# Patient Record
Sex: Female | Born: 1955 | Race: White | Hispanic: No | Smoking: Never smoker
Health system: Southern US, Community
[De-identification: ages and names within clinical notes are randomized; demographics above are authoritative.]

## PROBLEM LIST (undated history)

## (undated) DIAGNOSIS — M797 Fibromyalgia: Secondary | ICD-10-CM

## (undated) DIAGNOSIS — R7303 Prediabetes: Secondary | ICD-10-CM

## (undated) DIAGNOSIS — E7800 Pure hypercholesterolemia, unspecified: Secondary | ICD-10-CM

## (undated) DIAGNOSIS — I10 Essential (primary) hypertension: Secondary | ICD-10-CM

## (undated) DIAGNOSIS — M069 Rheumatoid arthritis, unspecified: Secondary | ICD-10-CM

## (undated) DIAGNOSIS — M1990 Unspecified osteoarthritis, unspecified site: Secondary | ICD-10-CM

## (undated) DIAGNOSIS — I4891 Unspecified atrial fibrillation: Secondary | ICD-10-CM

## (undated) HISTORY — PX: TONSILLECTOMY: SUR1361

## (undated) HISTORY — PX: CATARACT EXTRACTION: SUR2

## (undated) HISTORY — PX: BACK SURGERY: SHX140

---

## 2020-06-20 ENCOUNTER — Emergency Department (HOSPITAL_BASED_OUTPATIENT_CLINIC_OR_DEPARTMENT_OTHER): Payer: Medicare HMO

## 2020-06-20 ENCOUNTER — Other Ambulatory Visit: Payer: Self-pay

## 2020-06-20 ENCOUNTER — Encounter (HOSPITAL_BASED_OUTPATIENT_CLINIC_OR_DEPARTMENT_OTHER): Payer: Self-pay

## 2020-06-20 ENCOUNTER — Emergency Department (HOSPITAL_BASED_OUTPATIENT_CLINIC_OR_DEPARTMENT_OTHER)
Admission: EM | Admit: 2020-06-20 | Discharge: 2020-06-21 | Disposition: A | Discharge status: Home / Self Care | Payer: Medicare HMO | Attending: Emergency Medicine | Admitting: Emergency Medicine

## 2020-06-20 DIAGNOSIS — R609 Edema, unspecified: Secondary | ICD-10-CM

## 2020-06-20 DIAGNOSIS — R600 Localized edema: Secondary | ICD-10-CM | POA: Insufficient documentation

## 2020-06-20 DIAGNOSIS — I4891 Unspecified atrial fibrillation: Secondary | ICD-10-CM | POA: Insufficient documentation

## 2020-06-20 DIAGNOSIS — I10 Essential (primary) hypertension: Secondary | ICD-10-CM | POA: Insufficient documentation

## 2020-06-20 DIAGNOSIS — R0602 Shortness of breath: Secondary | ICD-10-CM | POA: Diagnosis not present

## 2020-06-20 DIAGNOSIS — Z7901 Long term (current) use of anticoagulants: Secondary | ICD-10-CM | POA: Diagnosis not present

## 2020-06-20 HISTORY — DX: Unspecified atrial fibrillation: I48.91

## 2020-06-20 HISTORY — DX: Rheumatoid arthritis, unspecified: M06.9

## 2020-06-20 HISTORY — DX: Pure hypercholesterolemia, unspecified: E78.00

## 2020-06-20 HISTORY — DX: Prediabetes: R73.03

## 2020-06-20 HISTORY — DX: Unspecified osteoarthritis, unspecified site: M19.90

## 2020-06-20 HISTORY — DX: Fibromyalgia: M79.7

## 2020-06-20 HISTORY — DX: Essential (primary) hypertension: I10

## 2020-06-20 LAB — CBC WITH DIFFERENTIAL/PLATELET
Abs Immature Granulocytes: 0.01 10*3/uL (ref 0.00–0.07)
Basophils Absolute: 0.1 10*3/uL (ref 0.0–0.1)
Basophils Relative: 1 %
Eosinophils Absolute: 0.2 10*3/uL (ref 0.0–0.5)
Eosinophils Relative: 3 %
HCT: 32.7 % — ABNORMAL LOW (ref 36.0–46.0)
Hemoglobin: 10.7 g/dL — ABNORMAL LOW (ref 12.0–15.0)
Immature Granulocytes: 0 %
Lymphocytes Relative: 24 %
Lymphs Abs: 1.9 10*3/uL (ref 0.7–4.0)
MCH: 31.8 pg (ref 26.0–34.0)
MCHC: 32.7 g/dL (ref 30.0–36.0)
MCV: 97 fL (ref 80.0–100.0)
Monocytes Absolute: 1 10*3/uL (ref 0.1–1.0)
Monocytes Relative: 13 %
Neutro Abs: 4.6 10*3/uL (ref 1.7–7.7)
Neutrophils Relative %: 59 %
Platelets: 234 10*3/uL (ref 150–400)
RBC: 3.37 MIL/uL — ABNORMAL LOW (ref 3.87–5.11)
RDW: 14.7 % (ref 11.5–15.5)
WBC: 7.8 10*3/uL (ref 4.0–10.5)
nRBC: 0 % (ref 0.0–0.2)

## 2020-06-20 IMAGING — DX DG CHEST 2V
2 series · 2 of 2 positions shown · non-contrast
Comparison: None.

CLINICAL DATA: Bilateral lower extremity pain and swelling.

EXAM:
CHEST - 2 VIEW

[chest pa]
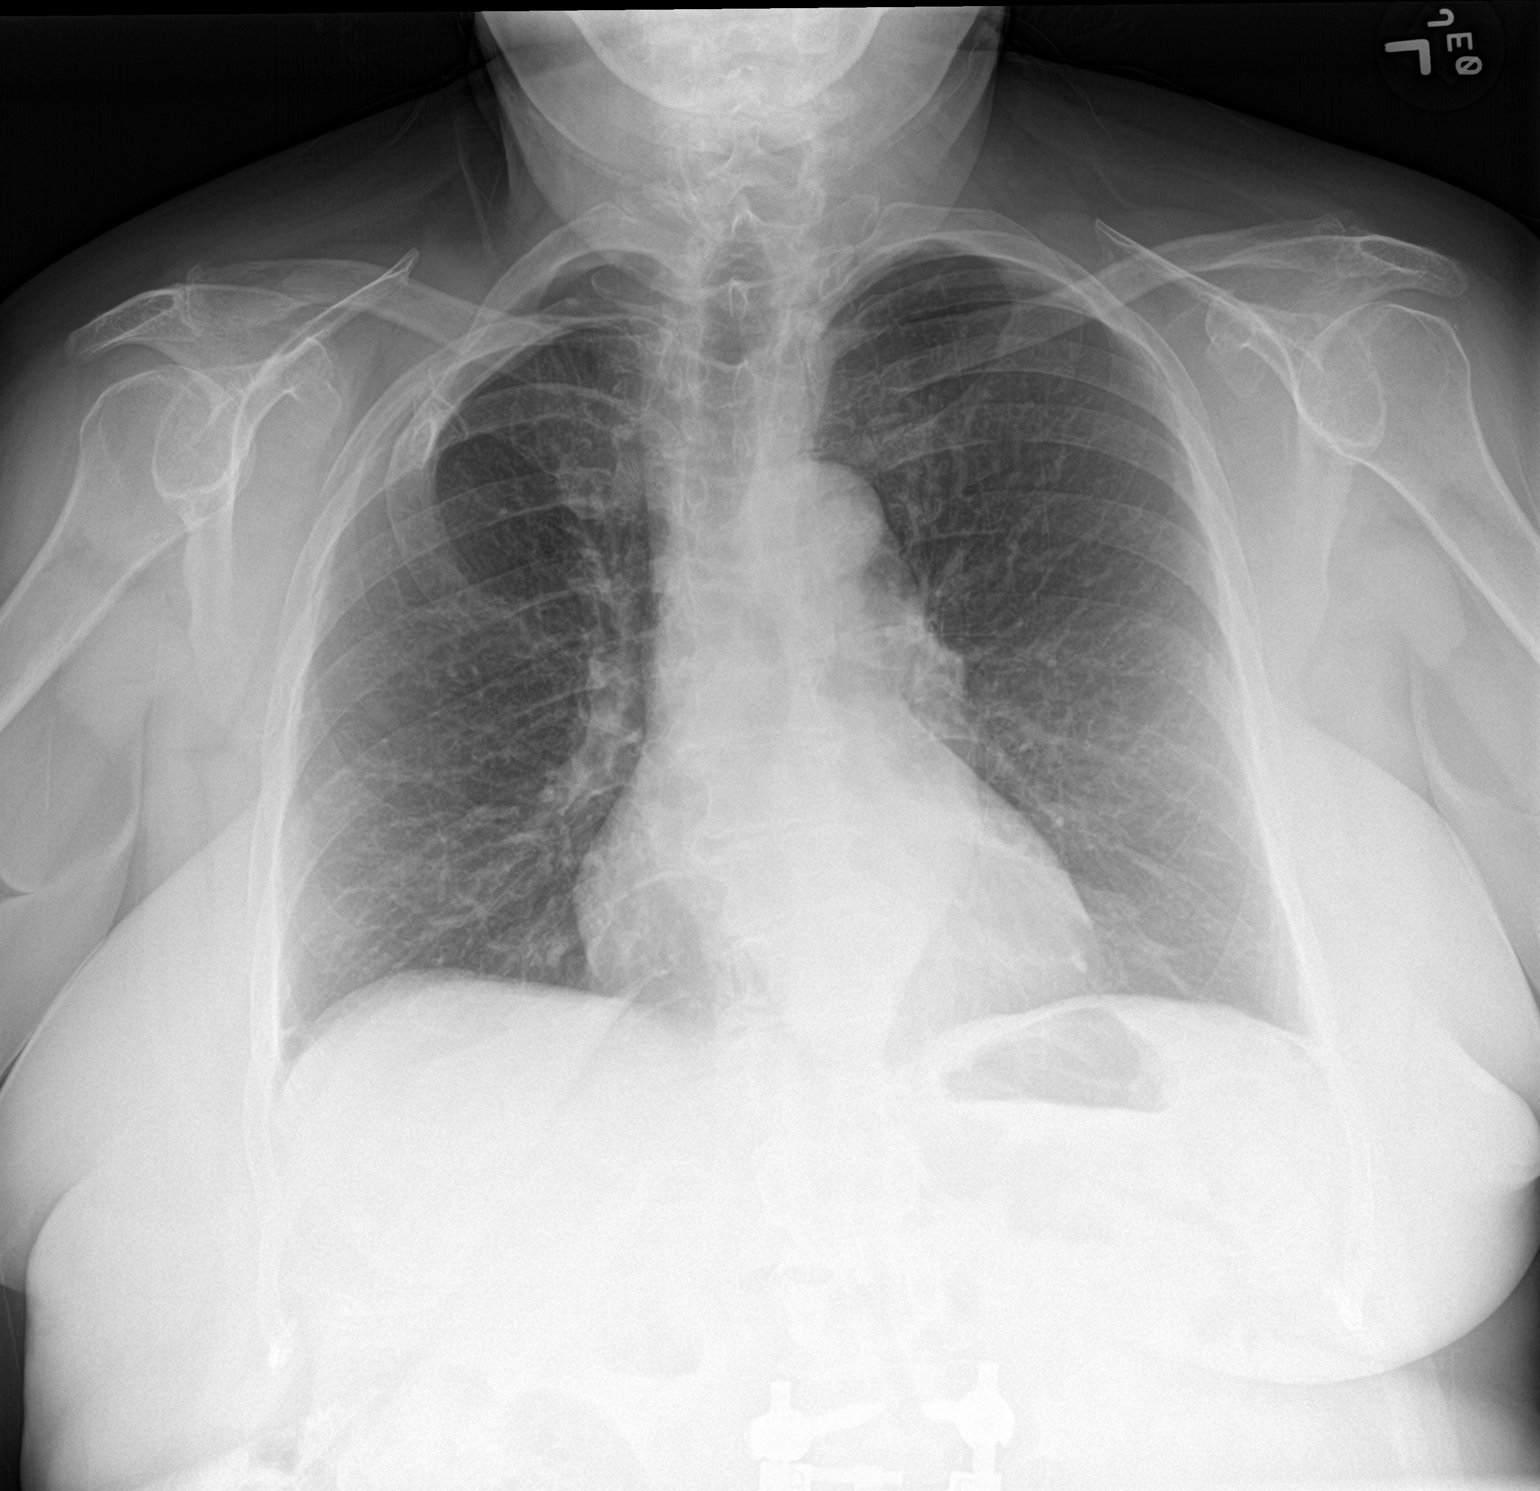

[chest lat]
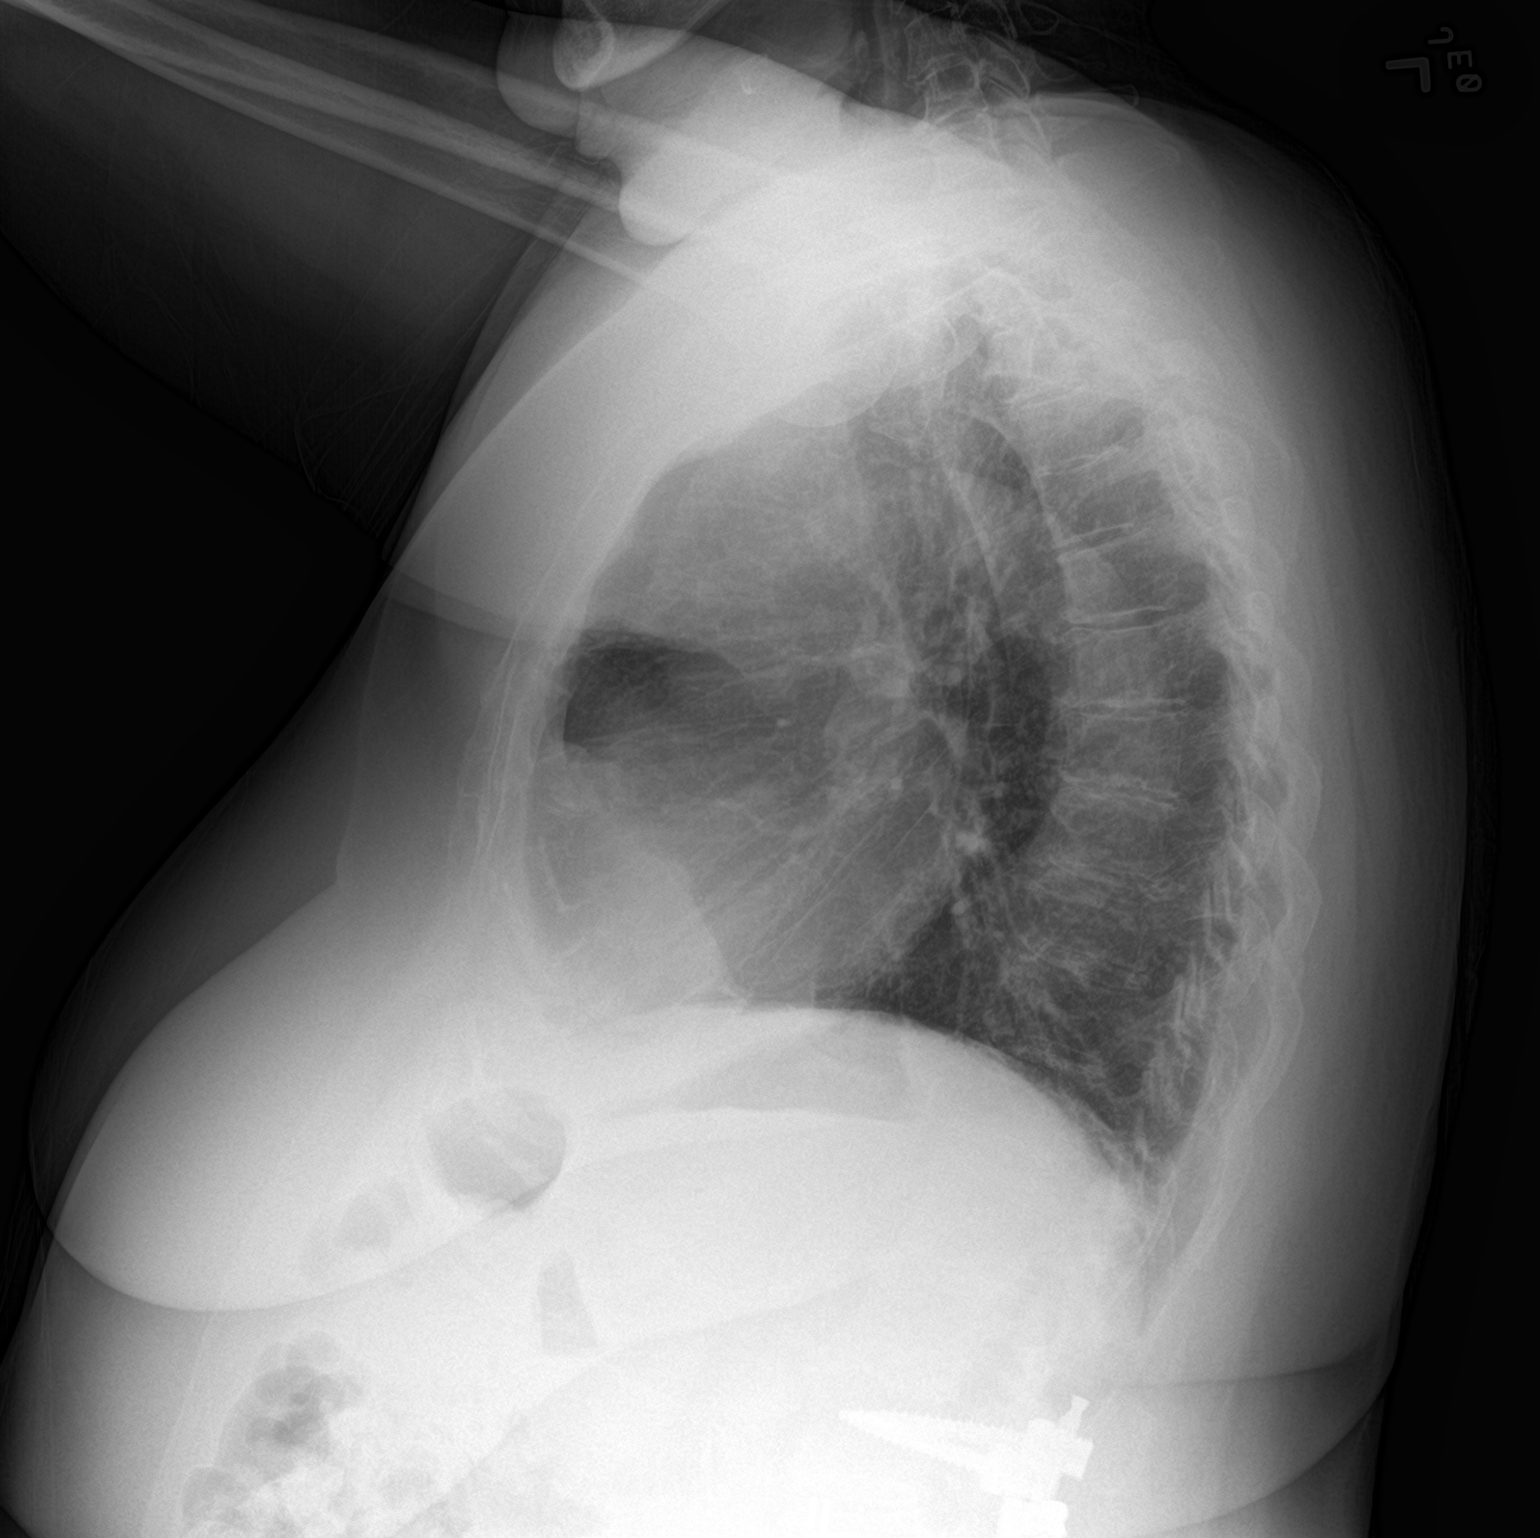

[2 of 2 positions shown; findings below may reference images not displayed]

FINDINGS: Chronic appearing increased lung markings are seen without evidence
of acute infiltrate, pleural effusion or pneumothorax. The heart
size and mediastinal contours are within normal limits. There is
tortuosity of the descending thoracic aorta. Chronic right rib
deformities are noted. Postoperative changes are seen within the
visualized portion of the lumbar spine.
IMPRESSION: No acute or active cardiopulmonary disease.

## 2020-06-20 NOTE — ED Triage Notes (Addendum)
Pt c/o bilat LE swelling/pain started 3/3 after a flight-states her O2 sats were mid 80s-mid 90s but was 97 at PCP office today-states she was seen by PCP today and advised to come to ED-NAD-steady gait

## 2020-06-20 NOTE — ED Provider Notes (Signed)
[facility] EMERGENCY DEPARTMENT Provider Note   CSN: [id number] Arrival date & time: 06/20/20  2025     History Chief Complaint  Patient presents with  . Leg Swelling    Holly Henry is a 65 y.o. female.  HPI     This is a 66 year old female with history of atrial fibrillation on Eliquis, hypertension, rheumatoid arthritis who presents with lower extremity swelling.  Patient reports that she traveled to [place] last week.  She got on an airplane to fly home and noted the next day to have bilateral foot swelling.  She did not note any significant leg swelling.  Since that time she is also had some shortness of breath.  She states her O2 sats have been anywhere from 89 to 97% at home.  She not had any cough or fevers.  Patient reports compliance with her Eliquis.  She was seen and evaluated by her primary physician and referred here for further evaluation.  Patient denies any chest pain.  She states that she feels her swelling is overall better.  It has always been symmetric.  Chart reviewed.  Patient referred to the ED by her primary physician for concern for possible PE/DVT given recent travel  Past Medical History:  Diagnosis Date  . A-fib (HCC)   . Fibromyalgia   . High cholesterol   . Hypertension   . Osteoarthritis   . Prediabetes   . Rheumatoid arthritis (HCC)     There are no problems to display for this patient.   Past Surgical History:  Procedure Laterality Date  . BACK SURGERY    . TONSILLECTOMY       OB History   No obstetric history on file.     No family history on file.  Social History   Tobacco Use  . Smoking status: Never Smoker  . Smokeless tobacco: Never Used  Substance Use Topics  . Alcohol use: Never  . Drug use: Never    Home Medications Prior to Admission medications   Medication Sig Start Date End Date Taking? Authorizing Provider  furosemide (LASIX) 20 MG tablet Take 1 tablet (20 mg total) by mouth daily. 06/21/20  Yes  Nehemiah Montee, Mayer Masker, MD    Allergies    Patient has no known allergies.  Review of Systems   Review of Systems  Constitutional: Negative for fever.  Respiratory: Positive for shortness of breath. Negative for cough.   Cardiovascular: Positive for leg swelling. Negative for chest pain.  Gastrointestinal: Negative for abdominal pain, nausea and vomiting.  All other systems reviewed and are negative.   Physical Exam Updated Vital Signs BP (!) 123/59 (BP Location: Right Arm)   Pulse 80   Temp 98.4 F (36.9 C) (Oral)   Resp (!) 29   Ht 1.549 m (5\' 1" )   Wt 90.9 kg   SpO2 95%   BMI 37.88 kg/m   Physical Exam Vitals and nursing note reviewed.  Constitutional:      Appearance: She is well-developed and well-nourished. She is obese. She is not ill-appearing.  HENT:     Head: Normocephalic and atraumatic.     Nose: Nose normal.     Mouth/Throat:     Mouth: Mucous membranes are moist.  Eyes:     Pupils: Pupils are equal, round, and reactive to light.  Cardiovascular:     Rate and Rhythm: Normal rate and regular rhythm.     Heart sounds: Normal heart sounds.  Pulmonary:     Effort:  Pulmonary effort is normal. No respiratory distress.     Breath sounds: No wheezing.  Abdominal:     General: Bowel sounds are normal.     Palpations: Abdomen is soft.     Tenderness: There is no abdominal tenderness.  Musculoskeletal:     Cervical back: Neck supple.     Right lower leg: Edema present.     Left lower leg: Edema present.     Comments: Symmetric trace bilateral lower extremity edema  Skin:    General: Skin is warm and dry.  Neurological:     Mental Status: She is alert and oriented to person, place, and time.  Psychiatric:        Mood and Affect: Mood and affect and mood normal.     ED Results / Procedures / Treatments   Labs (all labs ordered are listed, but only abnormal results are displayed) Labs Reviewed  CBC WITH DIFFERENTIAL/PLATELET - Abnormal; Notable for the  following components:      Result Value   RBC 3.37 (*)    Hemoglobin 10.7 (*)    HCT 32.7 (*)    All other components within normal limits  BASIC METABOLIC PANEL - Abnormal; Notable for the following components:   Potassium 3.4 (*)    Glucose, Bld 128 (*)    Creatinine, Ser 1.07 (*)    GFR, Estimated 58 (*)    All other components within normal limits  BRAIN NATRIURETIC PEPTIDE - Abnormal; Notable for the following components:   B Natriuretic Peptide 119.0 (*)    All other components within normal limits  D-DIMER, QUANTITATIVE (NOT AT [facility]) - Abnormal; Notable for the following components:   D-Dimer, Quant 0.57 (*)    All other components within normal limits    EKG None ED ECG REPORT   Date: 06/21/2020  Rate: 69  Rhythm: normal sinus rhythm  QRS Axis: normal  Intervals: normal  ST/T Wave abnormalities: normal  Conduction Disutrbances:none  Narrative Interpretation:   Old EKG Reviewed: none available  I have personally reviewed the EKG tracing and agree with the computerized printout as noted.  Radiology DG Chest 2 View  Result Date: 06/21/2020 CLINICAL DATA:  Bilateral lower extremity pain and swelling. EXAM: CHEST - 2 VIEW COMPARISON:  None. FINDINGS: Chronic appearing increased lung markings are seen without evidence of acute infiltrate, pleural effusion or pneumothorax. The heart size and mediastinal contours are within normal limits. There is tortuosity of the descending thoracic aorta. Chronic right rib deformities are noted. Postoperative changes are seen within the visualized portion of the lumbar spine. IMPRESSION: No acute or active cardiopulmonary disease. Electronically Signed   By: Aram Candela M.D.   On: 06/21/2020 00:13    Procedures Procedures   Medications Ordered in ED Medications  furosemide (LASIX) tablet 20 mg (20 mg Oral Given 06/21/20 0138)    ED Course  I have reviewed the triage vital signs and the nursing notes.  Pertinent labs &  imaging results that were available during my care of the patient were reviewed by me and considered in my medical decision making (see chart for details).    MDM Rules/Calculators/A&P                          Patient presents with lower extremity edema.  Overall nontoxic vital signs are reassuring.  She does report some intermittent shortness of breath.  She is not in any respiratory distress.  Swelling appears symmetric  and trace on my exam.  No calf swelling or asymmetry to suggest DVT.  She also reports compliance with Eliquis.  Have a low suspicion for DVT.  Will send screening D-dimer.  Will also screen for heart failure with chest x-ray and BNP.  Chest x-ray clear without evidence of pleural effusion or vascular congestion.  BNP is slightly elevated at 119.  D-dimer is 0.57 which based on age-adjusted cutoffs would be negative.  Patient was given 20 mg p.o. Lasix.  Will place on Lasix for the next 2 days to increase urine output.  Highly suspect peripheral edema and volume overload.  Recommend monitoring salt in the diet.  For completeness, I did order outpatient lower extremity Dopplers as well.  Recommend follow-up with primary physician.  After history, exam, and medical workup I feel the patient has been appropriately medically screened and is safe for discharge home. Pertinent diagnoses were discussed with the patient. Patient was given return precautions.  Final Clinical Impression(s) / ED Diagnoses Final diagnoses:  Peripheral edema    Rx / DC Orders ED Discharge Orders         Ordered    furosemide (LASIX) 20 MG tablet  Daily        06/21/20 0148    US Venous Img Lower Bilateral        06/21/20 0148           Shon Baton, MD 06/21/20 [phone]

## 2020-06-20 NOTE — ED Notes (Signed)
Pt placed in a gown and hooked up to the monitor with the BP cuff and pulse ox 

## 2020-06-21 ENCOUNTER — Ambulatory Visit (HOSPITAL_BASED_OUTPATIENT_CLINIC_OR_DEPARTMENT_OTHER)
Admission: RE | Admit: 2020-06-21 | Discharge: 2020-06-21 | Disposition: A | Discharge status: Home / Self Care | Payer: Medicare HMO | Source: Ambulatory Visit | Attending: Emergency Medicine | Admitting: Emergency Medicine

## 2020-06-21 DIAGNOSIS — R609 Edema, unspecified: Secondary | ICD-10-CM | POA: Insufficient documentation

## 2020-06-21 LAB — BASIC METABOLIC PANEL
Anion gap: 11 (ref 5–15)
BUN: 21 mg/dL (ref 8–23)
CO2: 27 mmol/L (ref 22–32)
Calcium: 9.6 mg/dL (ref 8.9–10.3)
Chloride: 102 mmol/L (ref 98–111)
Creatinine, Ser: 1.07 mg/dL — ABNORMAL HIGH (ref 0.44–1.00)
GFR, Estimated: 58 mL/min — ABNORMAL LOW (ref >60)
Glucose, Bld: 128 mg/dL — ABNORMAL HIGH (ref 70–99)
Potassium: 3.4 mmol/L — ABNORMAL LOW (ref 3.5–5.1)
Sodium: 140 mmol/L (ref 135–145)

## 2020-06-21 LAB — BRAIN NATRIURETIC PEPTIDE: B Natriuretic Peptide: 119 pg/mL — ABNORMAL HIGH (ref 0.0–100.0)

## 2020-06-21 LAB — D-DIMER, QUANTITATIVE: D-Dimer, Quant: 0.57 ug/mL-FEU — ABNORMAL HIGH (ref 0.00–0.50)

## 2020-06-21 IMAGING — US US EXTREM LOW VENOUS
1 series · 13 of 24 positions shown · non-contrast
Comparison: None.

CLINICAL DATA: Lower extremity swelling.



[Series 1: us extrem low venous · 13 of 71 slices shown]
[im 1/71]
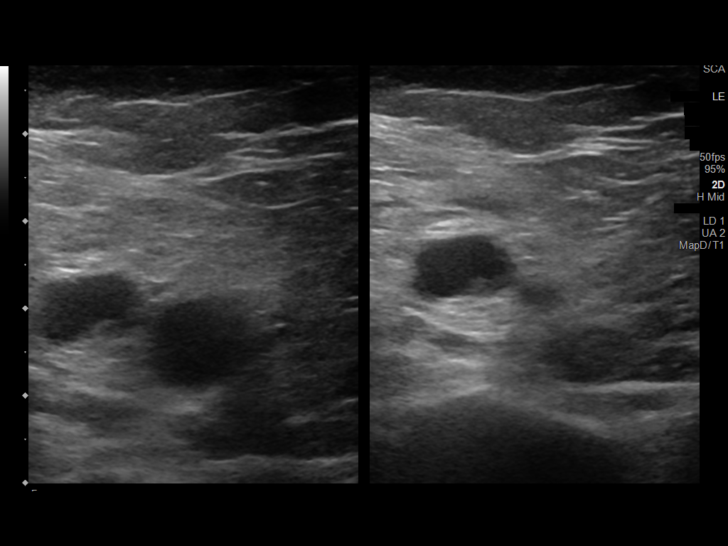
[im 7/71]
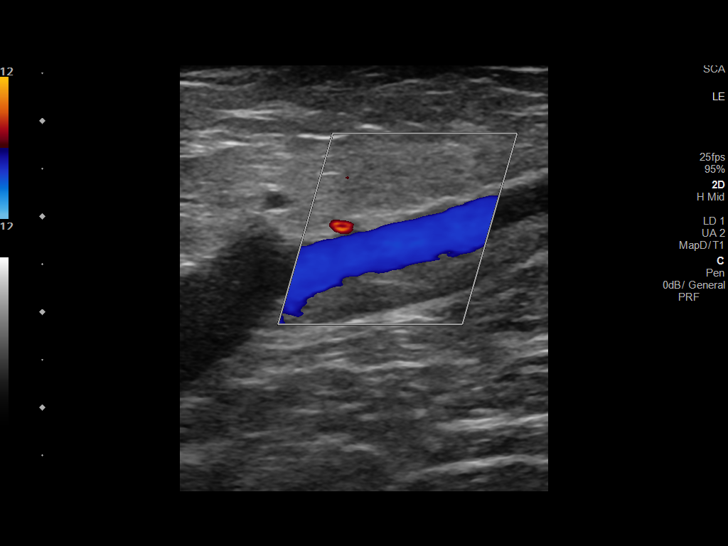
[im 13/71]
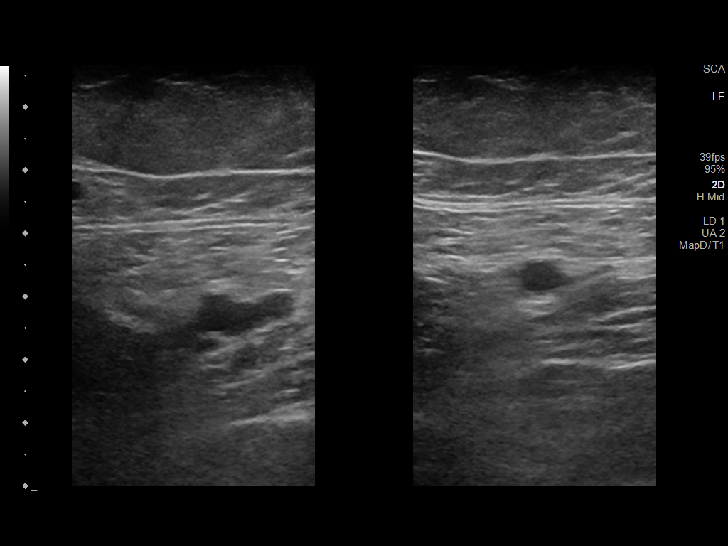
[im 19/71]
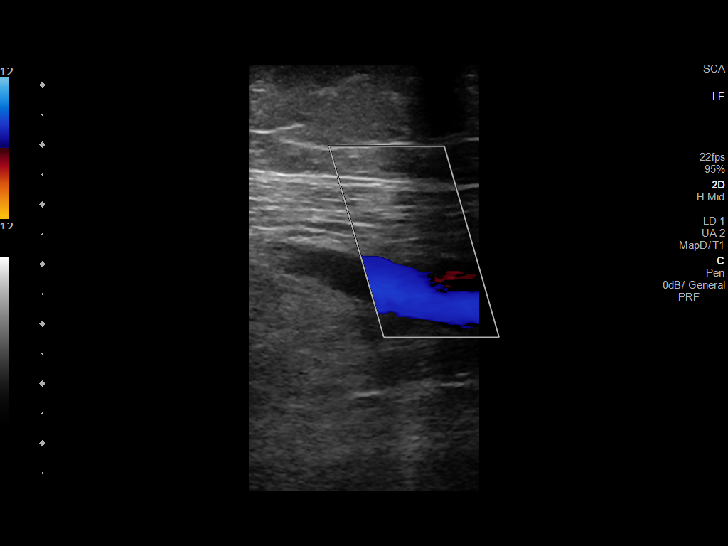
[im 25/71]
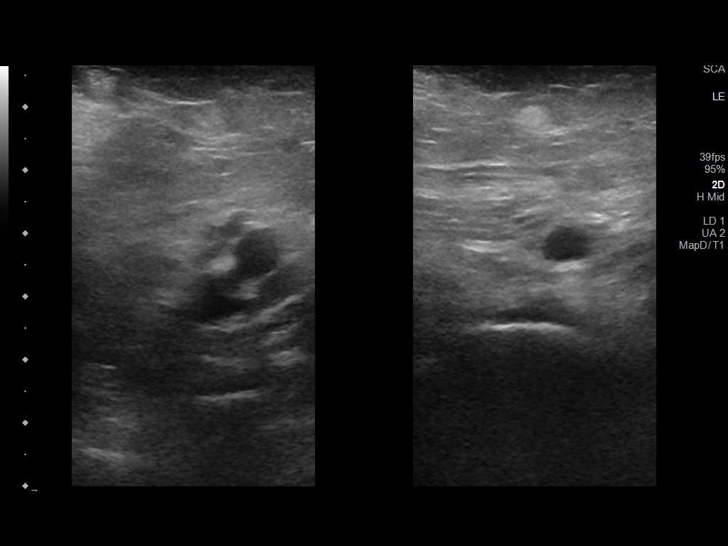
[im 31/71]
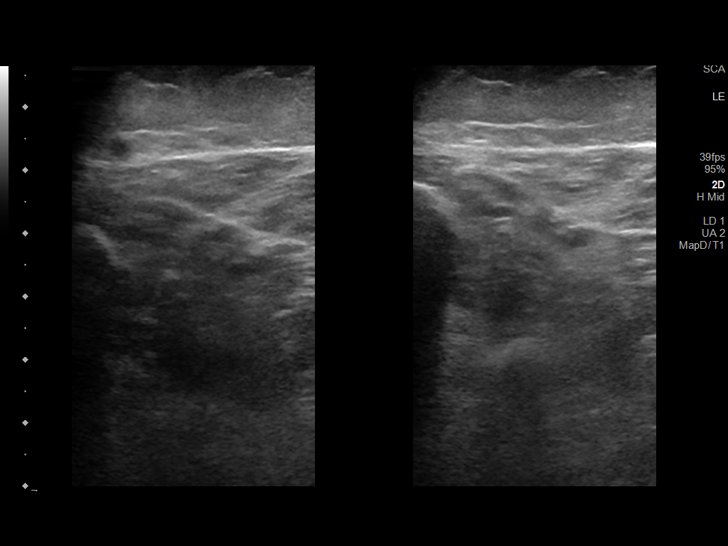
[im 37/71]
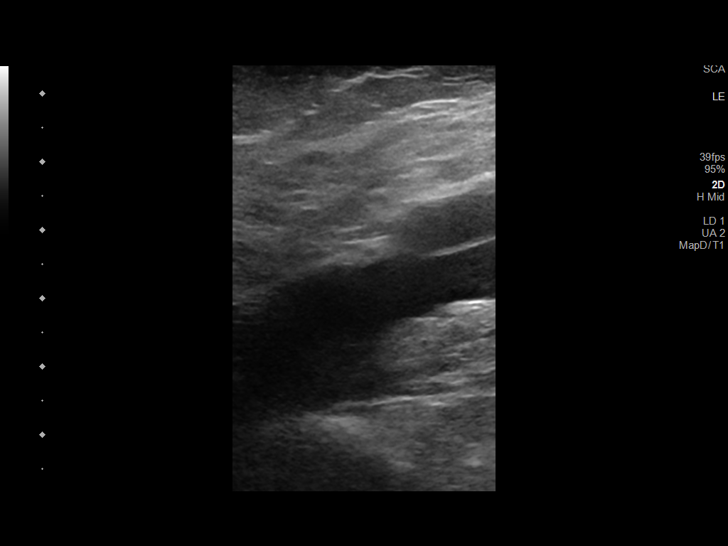
[im 40/71]
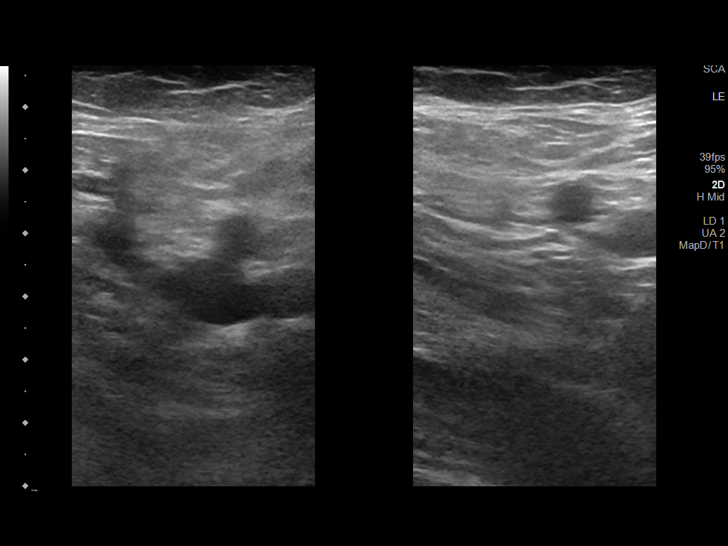
[im 46/71]
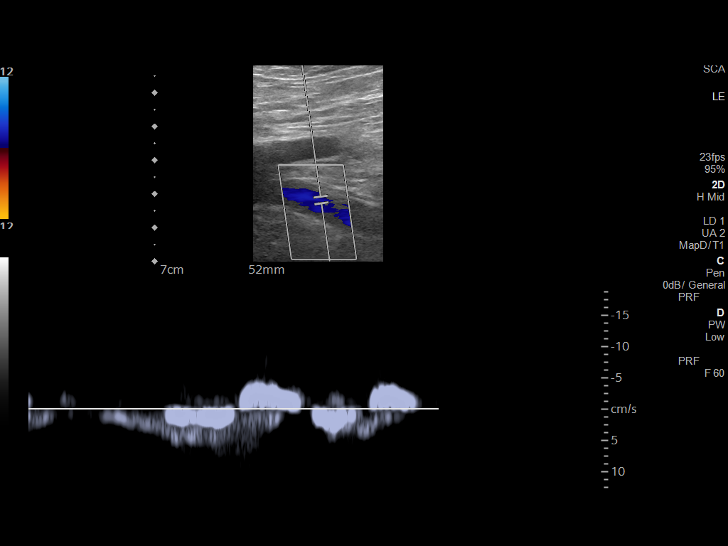
[im 52/71]
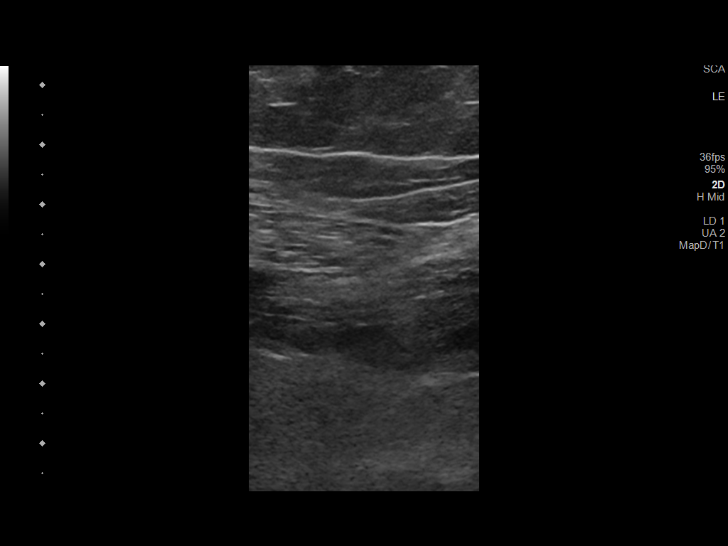
[im 58/71]
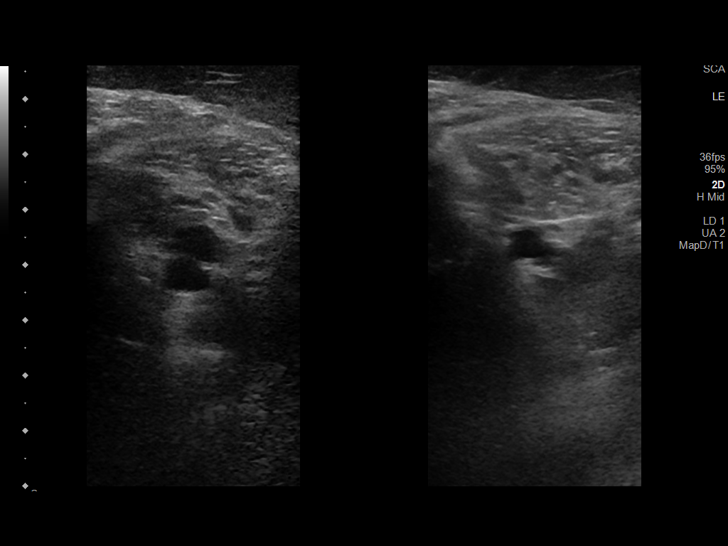
[im 64/71]
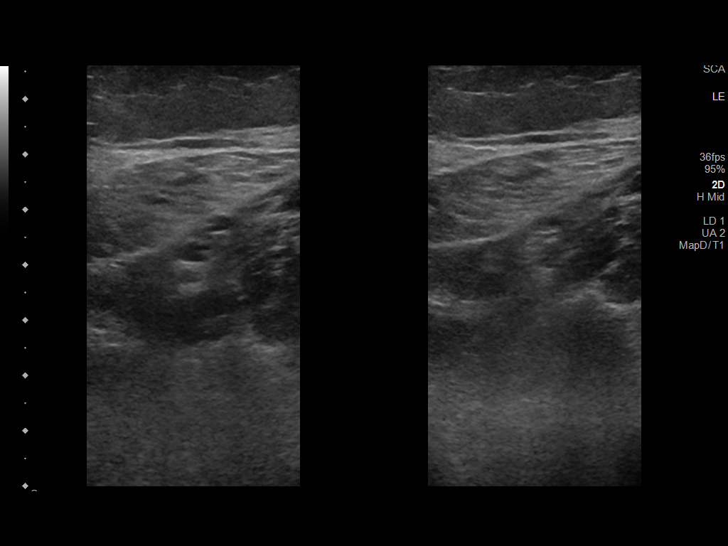
[im 71/71]
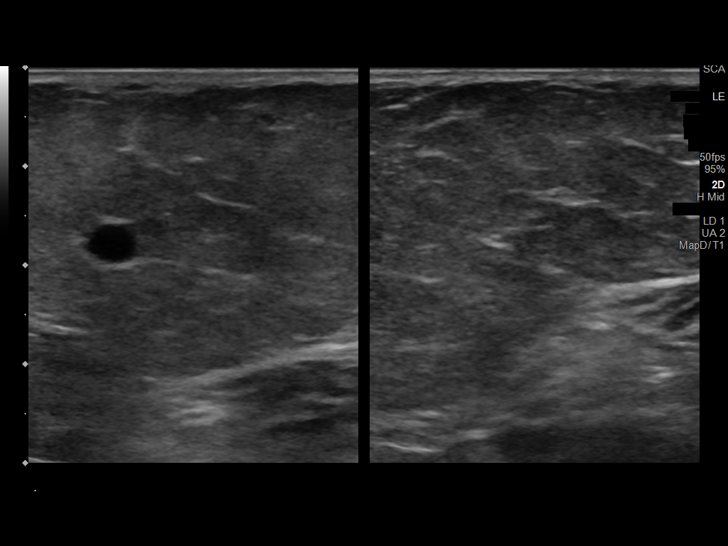

[13 of 24 positions shown; findings below may reference images not displayed]

FINDINGS: RIGHT LOWER EXTREMITY

Common Femoral Vein: No evidence of thrombus. Normal
compressibility, respiratory phasicity and response to augmentation.

Saphenofemoral Junction: No evidence of thrombus. Normal
compressibility and flow on color Doppler imaging.

Profunda Femoral Vein: No evidence of thrombus. Normal
compressibility and flow on color Doppler imaging.

Femoral Vein: No evidence of thrombus. Normal compressibility,
respiratory phasicity and response to augmentation.

Popliteal Vein: No evidence of thrombus. Normal compressibility,
respiratory phasicity and response to augmentation.

Calf Veins: No evidence of thrombus. Normal compressibility and flow
on color Doppler imaging.

Superficial Great Saphenous Vein: No evidence of thrombus. Normal
compressibility.

Other Findings:  None.

LEFT LOWER EXTREMITY

Common Femoral Vein: No evidence of thrombus. Normal
compressibility, respiratory phasicity and response to augmentation.

Saphenofemoral Junction: No evidence of thrombus. Normal
compressibility and flow on color Doppler imaging.

Profunda Femoral Vein: No evidence of thrombus. Normal
compressibility and flow on color Doppler imaging.

Femoral Vein: No evidence of thrombus. Normal compressibility,
respiratory phasicity and response to augmentation.

Popliteal Vein: No evidence of thrombus. Normal compressibility,
respiratory phasicity and response to augmentation.

Calf Veins: No evidence of thrombus. Normal compressibility and flow
on color Doppler imaging.

Superficial Great Saphenous Vein: No evidence of thrombus. Normal
compressibility.

Other Findings:  None.
IMPRESSION: No evidence of deep venous thrombosis in either lower extremity.

## 2020-06-21 MED ORDER — FUROSEMIDE 20 MG PO TABS
20.0000 mg | ORAL_TABLET | Freq: Once | ORAL | Status: AC
  Administered 2020-06-21: 20 mg via ORAL
  Filled 2020-06-21: qty 1

## 2020-06-21 MED ORDER — FUROSEMIDE 20 MG PO TABS: 20.0000 mg | ORAL_TABLET | Freq: Every day | ORAL | 0 refills | Status: AC

## 2020-06-21 NOTE — Discharge Instructions (Addendum)
You are seen today for lower extremity swelling.  At this time I have low suspicion for DVT but will order ultrasound for you to return for.  You will be placed on Lasix for 2 additional days for fluid retention.  Follow-up with your primary care physician.

## 2020-06-21 NOTE — ED Provider Notes (Signed)
Patient returned to the ER for venous doppler bilateral lower extremities, negative for DVT. Discussed results, plan is to follow up with PCP.    Jeannie Fend, PA-C 06/21/20 1205    Benjiman Core, MD 06/21/20 1451

## 2020-06-21 NOTE — ED Notes (Signed)
Attempted ultrasound IV X 2 without success. Patient tolerated well. 

## 2020-09-28 ENCOUNTER — Emergency Department (HOSPITAL_COMMUNITY): Payer: Medicare HMO

## 2020-09-28 ENCOUNTER — Encounter (HOSPITAL_COMMUNITY): Payer: Self-pay | Admitting: Emergency Medicine

## 2020-09-28 ENCOUNTER — Emergency Department (HOSPITAL_COMMUNITY)
Admission: EM | Admit: 2020-09-28 | Discharge: 2020-09-28 | Disposition: A | Discharge status: Home / Self Care | Payer: Medicare HMO | Attending: Emergency Medicine | Admitting: Emergency Medicine

## 2020-09-28 DIAGNOSIS — Z79899 Other long term (current) drug therapy: Secondary | ICD-10-CM | POA: Insufficient documentation

## 2020-09-28 DIAGNOSIS — R911 Solitary pulmonary nodule: Secondary | ICD-10-CM | POA: Diagnosis not present

## 2020-09-28 DIAGNOSIS — R079 Chest pain, unspecified: Secondary | ICD-10-CM | POA: Diagnosis not present

## 2020-09-28 DIAGNOSIS — I10 Essential (primary) hypertension: Secondary | ICD-10-CM | POA: Diagnosis not present

## 2020-09-28 DIAGNOSIS — Z7901 Long term (current) use of anticoagulants: Secondary | ICD-10-CM | POA: Insufficient documentation

## 2020-09-28 DIAGNOSIS — M25512 Pain in left shoulder: Secondary | ICD-10-CM | POA: Insufficient documentation

## 2020-09-28 DIAGNOSIS — I4891 Unspecified atrial fibrillation: Secondary | ICD-10-CM | POA: Diagnosis not present

## 2020-09-28 LAB — BASIC METABOLIC PANEL
Anion gap: 8 (ref 5–15)
BUN: 19 mg/dL (ref 8–23)
CO2: 26 mmol/L (ref 22–32)
Calcium: 8.7 mg/dL — ABNORMAL LOW (ref 8.9–10.3)
Chloride: 103 mmol/L (ref 98–111)
Creatinine, Ser: 1.02 mg/dL — ABNORMAL HIGH (ref 0.44–1.00)
GFR, Estimated: >60 mL/min (ref >60)
Glucose, Bld: 116 mg/dL — ABNORMAL HIGH (ref 70–99)
Potassium: 4.2 mmol/L (ref 3.5–5.1)
Sodium: 137 mmol/L (ref 135–145)

## 2020-09-28 LAB — CBC
HCT: 34.5 % — ABNORMAL LOW (ref 36.0–46.0)
Hemoglobin: 11.1 g/dL — ABNORMAL LOW (ref 12.0–15.0)
MCH: 30.7 pg (ref 26.0–34.0)
MCHC: 32.2 g/dL (ref 30.0–36.0)
MCV: 95.3 fL (ref 80.0–100.0)
Platelets: 165 10*3/uL (ref 150–400)
RBC: 3.62 MIL/uL — ABNORMAL LOW (ref 3.87–5.11)
RDW: 14.6 % (ref 11.5–15.5)
WBC: 6.1 10*3/uL (ref 4.0–10.5)
nRBC: 0 % (ref 0.0–0.2)

## 2020-09-28 LAB — TROPONIN I (HIGH SENSITIVITY)
Troponin I (High Sensitivity): 2 ng/L (ref <18)
Troponin I (High Sensitivity): 3 ng/L (ref <18)

## 2020-09-28 IMAGING — CT CT ANGIO CHEST
3 of 7 series · 18 of 46 positions shown · IV contrast (APPLIED)
Comparison: None.

CLINICAL DATA: Chest pain

EXAM:
CT ANGIOGRAPHY CHEST WITHOUT AND WITH CONTRAST
TECHNIQUE: Multidetector CT imaging of the chest was performed using the
standard protocol before and during bolus administration of
intravenous contrast. Multiplanar CT image reconstructions and MIPs
were obtained to evaluate the vascular anatomy.
CONTRAST:  75mL OMNIPAQUE IOHEXOL 350 MG/ML SOLN

[Series 6: arterial · axial · arterial · 0.76mm/px · z∈[+1036,+1276]mm · 11 of 146 slices shown]
[im 13/146  lung]
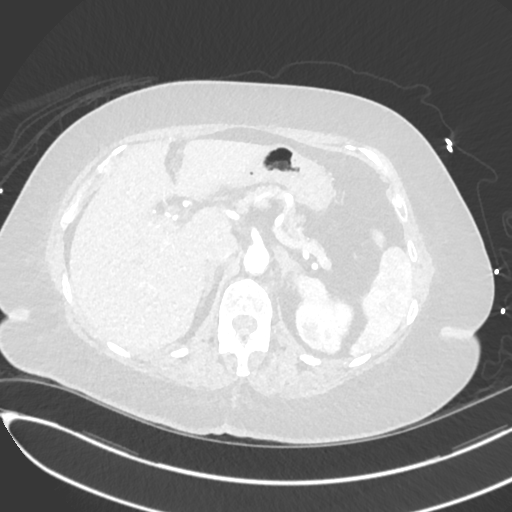
[im 25/146  soft-tissue]
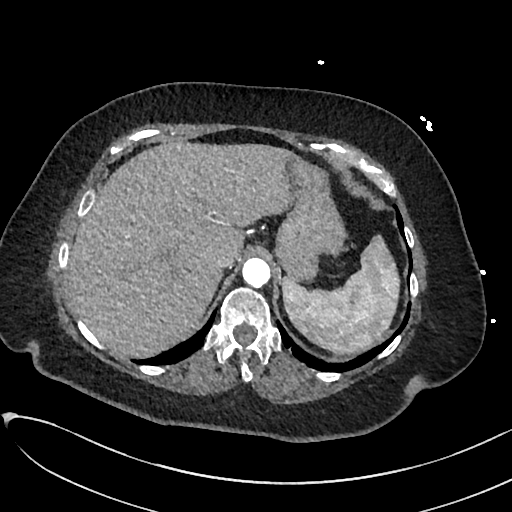
[im 37/146  lung]
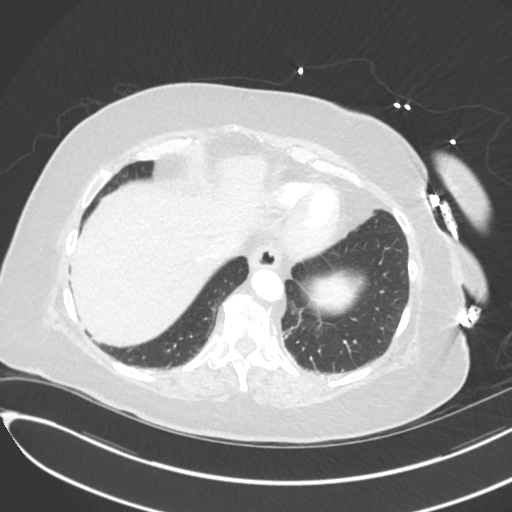
[im 49/146  soft-tissue]
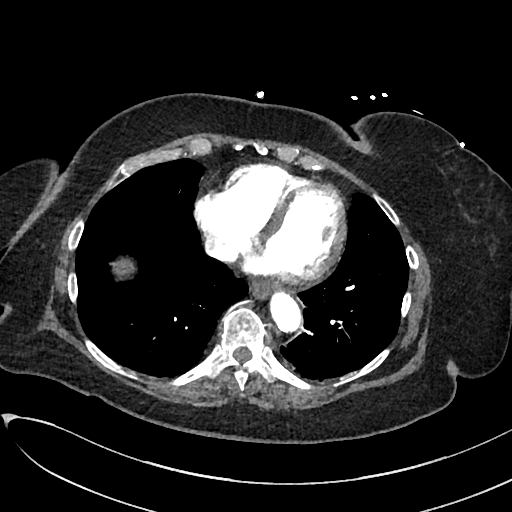
[im 61/146  lung]
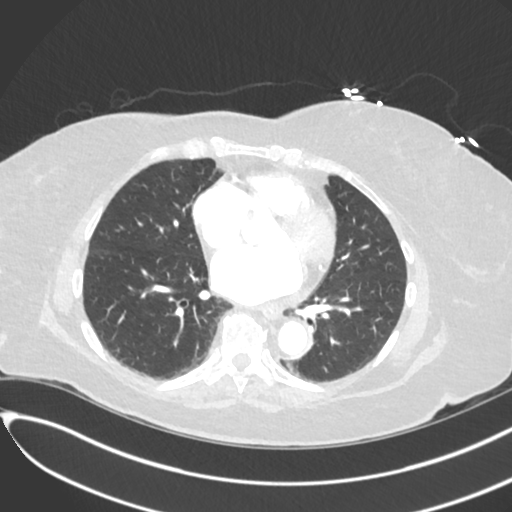
[im 73/146  soft-tissue]
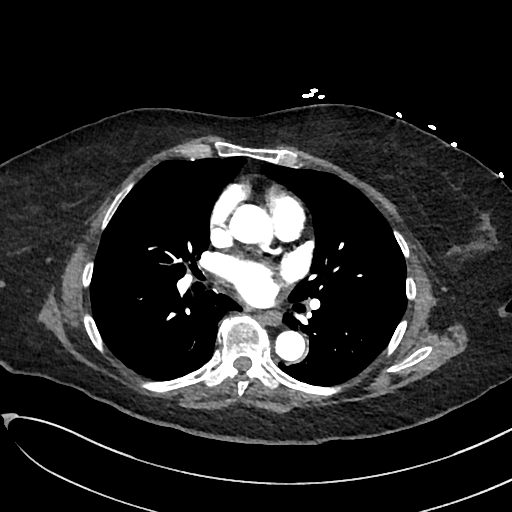
[im 85/146  lung]
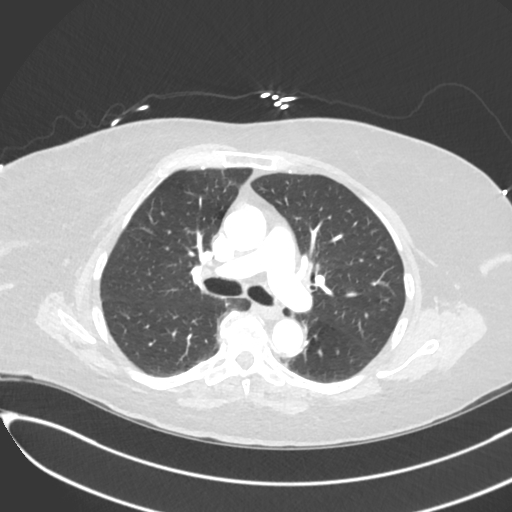
[im 97/146  soft-tissue]
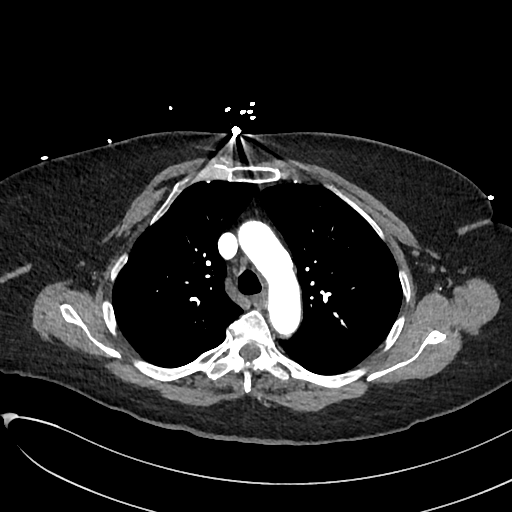
[im 109/146  lung]
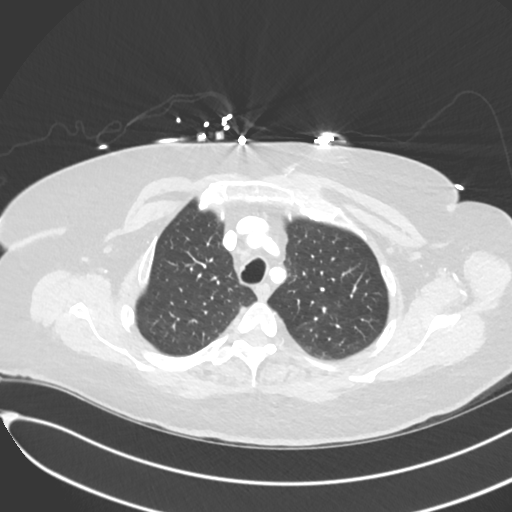
[im 121/146  soft-tissue]
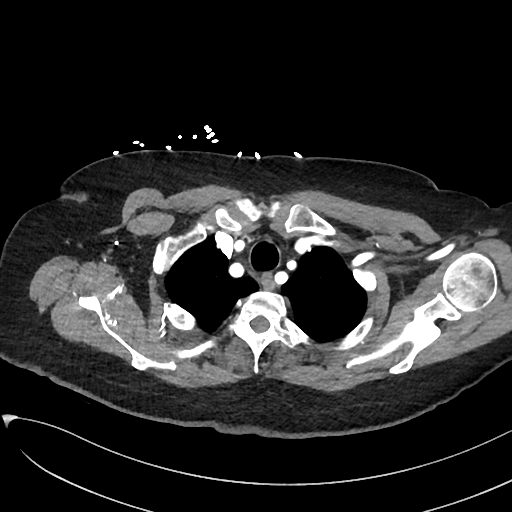
[im 133/146  lung]
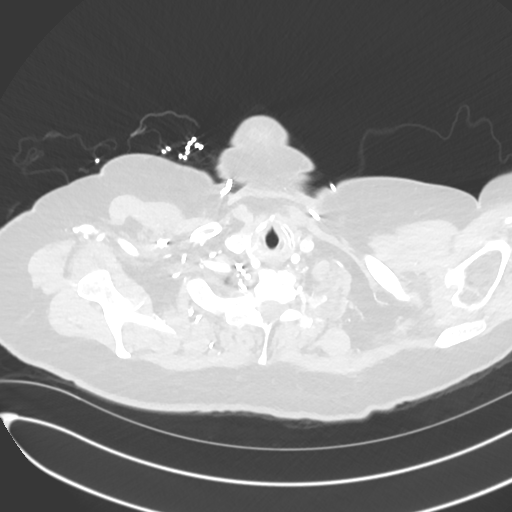

[Series 7: lung · axial · 0.76mm/px · z∈[+1060,+1148]mm · 4 of 132 slices shown]
[im 11/132  soft-tissue]
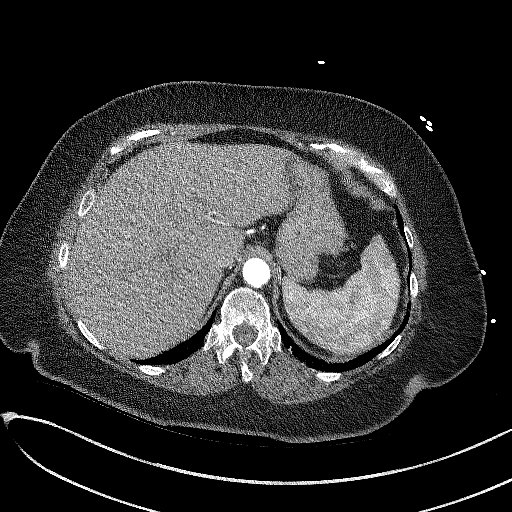
[im 33/132  soft-tissue]
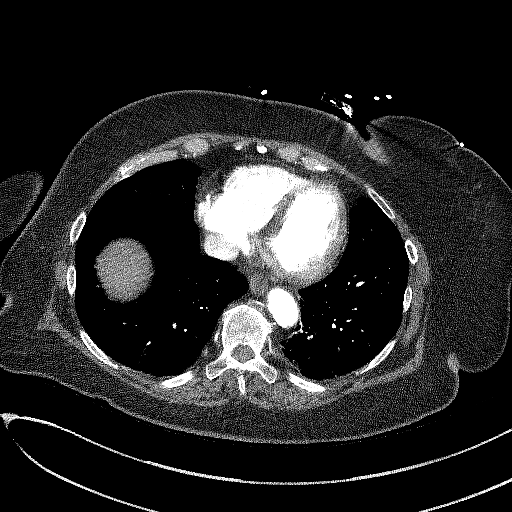
[im 44/132  soft-tissue]
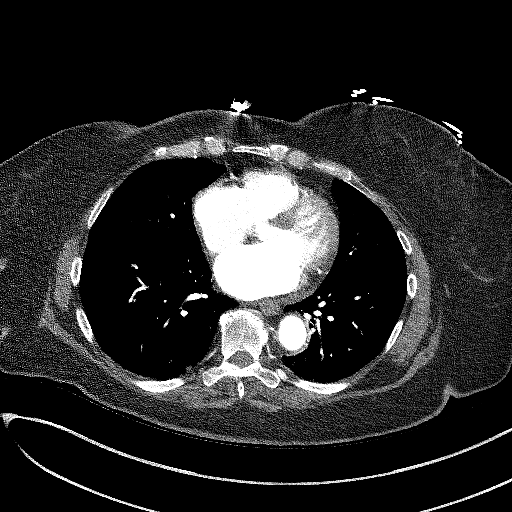
[im 55/132  soft-tissue]
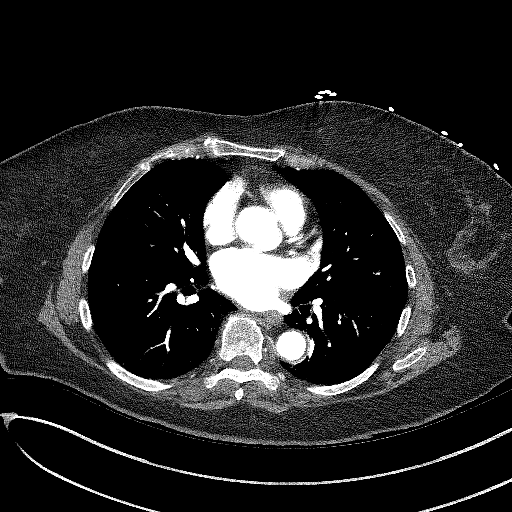

[Series 9: cor · coronal · 0.58mm/px · 3 of 151 slices shown]
[im 38/151  soft-tissue]
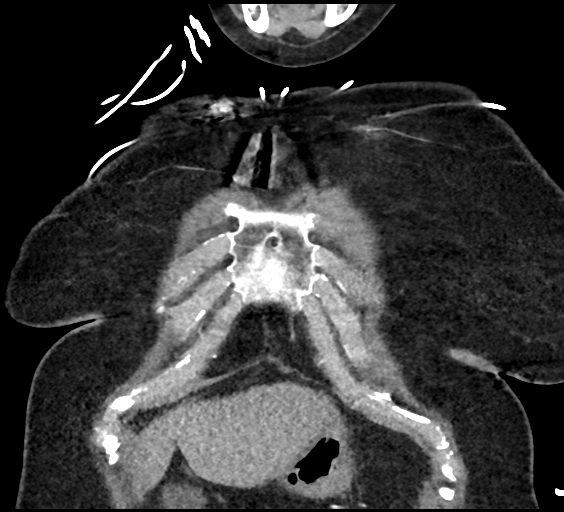
[im 76/151  soft-tissue]
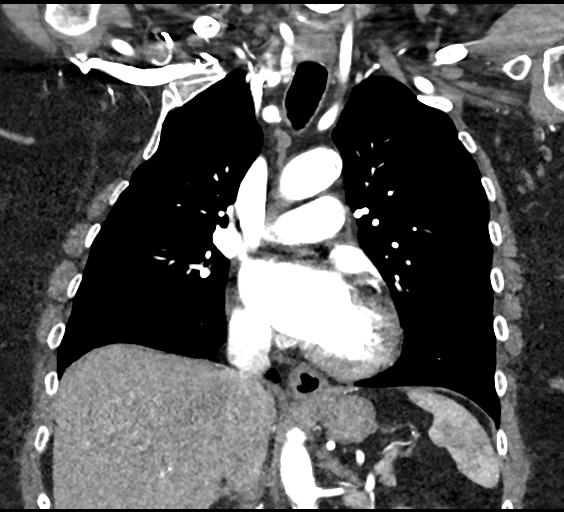
[im 113/151  soft-tissue]
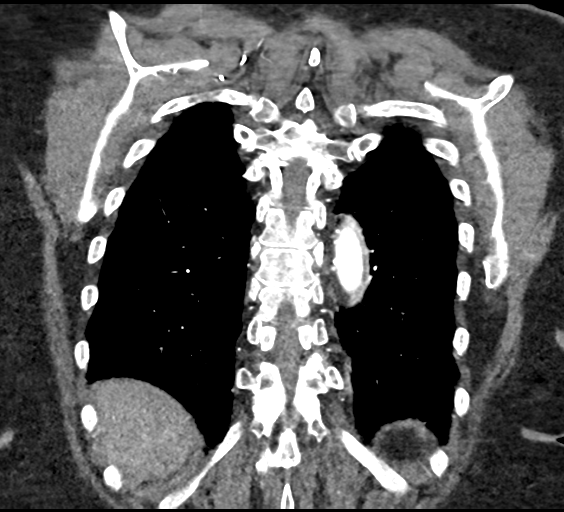

[18 of 46 positions shown; findings below may reference images not displayed]

FINDINGS: Cardiovascular: There is no evidence of intramural hematoma or
dissection. Thoracic aorta is normal in caliber. Mild calcified
plaque. Heart size is normal. No pericardial effusion. Minor
coronary artery calcification. Main pulmonary artery is normal in
caliber. There is no main or lobar pulmonary embolus.

Mediastinum/Nodes: No enlarged lymph nodes. Thyroid is unremarkable.
Esophagus is unremarkable.

Lungs/Pleura: There is a 5 x 2 (average diameter 3.5 mm) mm left
lower lobe nodule (series 7, image 103). No consolidation. Mild
scarring at the lung bases. No pleural effusion. No pneumothorax.

Upper Abdomen: No acute abnormality.

Musculoskeletal: Degenerative changes of the spine.

Review of the MIP images confirms the above findings.
IMPRESSION: No evidence of aortic dissection or other acute abnormality.

3 mm left lower lobe nodule. No follow-up needed if patient is
low-risk. Non-contrast chest CT can be considered in 12 months if
patient is high-risk. This recommendation follows the consensus
statement: Guidelines for Management of Incidental Pulmonary Nodules
Detected on CT Images: From the [HOSPITAL] [7D]; Radiology

## 2020-09-28 MED ORDER — IOHEXOL 350 MG/ML SOLN
75.0000 mL | Freq: Once | INTRAVENOUS | Status: AC | PRN
  Administered 2020-09-28: 75 mL via INTRAVENOUS

## 2020-09-28 NOTE — ED Provider Notes (Signed)
Care of the patient assumed at the change of shift. Patient with MSK pain in chest and L shoulder, worse with movement. ED workup has been unremarkable including Trop x 2 and CTA. Will provide sling for comfort, recommend she follow up with her pain management doctor that she sees for RA/OA for long term management.    Pollyann Savoy, MD 09/28/20 1750

## 2020-09-28 NOTE — ED Provider Notes (Signed)
[facility] EMERGENCY DEPARTMENT Provider Note   CSN: [id number] Arrival date & time: 09/28/20  1237     History Chief Complaint  Patient presents with   Chest Pain    Holly Henry is a 65 y.o. female.  Patient is a 65 year old female who presents with chest pain.  She has a history of atrial fibrillation on Eliquis, hyperlipidemia, hypertension, rheumatoid arthritis.  She presents with sharp chest pain on her left side.  She states that it started while she was talking to her daughter.  She says its worse with movement.  It does not seem to really be worse with exertion but worse with movement of the left arm or chest.  She also says it is worse with deep breathing.  She does feel a bit short of breath.  No nausea or vomiting.  No diaphoresis.  She says the pain does radiate to her mid back area.  She has had some subtle pains over the last couple days but today the pain got markedly worse and was sharp in nature.  No leg pain or swelling.  No history of similar symptoms in the past.      Past Medical History:  Diagnosis Date   A-fib (HCC)    Fibromyalgia    High cholesterol    Hypertension    Osteoarthritis    Prediabetes    Rheumatoid arthritis (HCC)     There are no problems to display for this patient.   Past Surgical History:  Procedure Laterality Date   BACK SURGERY     TONSILLECTOMY       OB History   No obstetric history on file.     No family history on file.  Social History   Tobacco Use   Smoking status: Never   Smokeless tobacco: Never  Substance Use Topics   Alcohol use: Never   Drug use: Never    Home Medications Prior to Admission medications   Medication Sig Start Date End Date Taking? Authorizing Provider  acetaminophen (TYLENOL) 500 MG tablet Take 1,000 mg by mouth See admin instructions. Take 2 tablets (1000 mg) by mouth twice daily - morning and mid-afternoon   Yes [provider]  albuterol (PROVENTIL) (2.5  MG/3ML) 0.083% nebulizer solution Take 2.5 mg by nebulization every 6 (six) hours as needed for wheezing or shortness of breath (allergies).   Yes [provider]  albuterol (VENTOLIN HFA) 108 (90 Base) MCG/ACT inhaler Inhale 2 puffs into the lungs every 6 (six) hours as needed for wheezing or shortness of breath (allergies). 04/25/20  Yes [provider]  apixaban (ELIQUIS) 5 MG TABS tablet Take 5 mg by mouth 2 (two) times daily.   Yes [provider]  atorvastatin (LIPITOR) 80 MG tablet Take 80 mg by mouth in the morning. 07/13/20  Yes [provider]  baclofen (LIORESAL) 10 MG tablet Take 10 mg by mouth at bedtime. 07/20/20  Yes [provider]  busPIRone (BUSPAR) 30 MG tablet Take 30 mg by mouth 2 (two) times daily. 09/18/20  Yes [provider]  diclofenac Sodium (VOLTAREN) 1 % GEL Apply 1 application topically 2 (two) times daily as needed (arthritis pain).   Yes [provider]  ezetimibe (ZETIA) 10 MG tablet Take 10 mg by mouth in the morning. 07/28/20  Yes [provider]  folic acid (FOLVITE) 1 MG tablet Take 1 mg by mouth at bedtime. 09/27/20  Yes [provider]  furosemide (LASIX) 20 MG tablet  Take 1 tablet (20 mg total) by mouth daily. Patient taking differently: Take 20 mg by mouth daily as needed for fluid or edema (feet swelling). 06/21/20  Yes Horton, Mayer Masker, MD  gabapentin (NEURONTIN) 600 MG tablet Take 600 mg by mouth 3 (three) times daily. 09/22/20  Yes [provider]  hydrochlorothiazide (HYDRODIURIL) 25 MG tablet Take 25 mg by mouth in the morning. 07/13/20  Yes [provider]  latanoprost (XALATAN) 0.005 % ophthalmic solution Place 1 drop into both eyes at bedtime. 08/10/20  Yes [provider]  loratadine (CLARITIN) 10 MG tablet Take 10 mg by mouth every morning. 05/02/20  Yes [provider]  methotrexate (RHEUMATREX) 2.5 MG tablet Take 15 mg by mouth every Thursday.  05/28/20  Yes [provider]  metoprolol tartrate (LOPRESSOR) 25 MG tablet Take 25 mg by mouth 2 (two) times daily. 09/12/20  Yes [provider]  montelukast (SINGULAIR) 10 MG tablet Take 10 mg by mouth at bedtime. 07/17/20  Yes [provider]  Multiple Vitamin (MULTIVITAMIN WITH MINERALS) TABS tablet Take 1 tablet by mouth daily.   Yes [provider]  naproxen sodium (ALEVE) 220 MG tablet Take 440 mg by mouth daily as needed (pain).   Yes [provider]  nystatin-triamcinolone ointment (MYCOLOG) Apply 1 application topically See admin instructions. Apply topically to perineal area up to three times daily as needed for rash/itching. 02/20/20  Yes [provider]  oxyCODONE (OXY IR/ROXICODONE) 5 MG immediate release tablet Take 5 mg by mouth 2 (two) times daily as needed for breakthrough pain. 09/21/20  Yes [provider]  pantoprazole (PROTONIX) 40 MG tablet Take 40 mg by mouth every morning. 09/10/20  Yes [provider]  traMADol (ULTRAM-ER) 300 MG 24 hr tablet Take 300 mg by mouth every morning. 09/12/20  Yes [provider]  traZODone (DESYREL) 50 MG tablet Take 75 mg by mouth at bedtime. 08/20/20  Yes [provider]    Allergies    Patient has no known allergies.  Review of Systems   Review of Systems  Constitutional:  Negative for chills, diaphoresis, fatigue and fever.  HENT:  Negative for congestion, rhinorrhea and sneezing.   Eyes: Negative.   Respiratory:  Positive for shortness of breath. Negative for cough and chest tightness.   Cardiovascular:  Positive for chest pain. Negative for leg swelling.  Gastrointestinal:  Negative for abdominal pain, blood in stool, diarrhea, nausea and vomiting.  Genitourinary:  Negative for difficulty urinating, flank pain, frequency and hematuria.  Musculoskeletal:  Negative for arthralgias and back pain.  Skin:  Negative for rash.  Neurological:  Negative for  dizziness, speech difficulty, weakness, numbness and headaches.   Physical Exam Updated Vital Signs BP 93/80   Pulse (!) 57   Temp (!) 97.5 F (36.4 C) (Oral)   Resp 17   SpO2 95%   Physical Exam Constitutional:      Appearance: She is well-developed.  HENT:     Head: Normocephalic and atraumatic.  Eyes:     Pupils: Pupils are equal, round, and reactive to light.  Cardiovascular:     Rate and Rhythm: Normal rate and regular rhythm.     Heart sounds: Normal heart sounds.     Comments: No pain on palpation of the chest wall.  There is some pain on range of motion of the left arm.  There is mild tenderness on palpation left anterior shoulder, no swelling of the arms.  Radial pulses are intact.  Normal sensation and motor function distally. Pulmonary:     Effort: Pulmonary effort is normal. No respiratory distress.     Breath sounds: Normal breath sounds. No wheezing or rales.  Chest:     Chest wall: No tenderness.  Abdominal:     General: Bowel sounds are normal.     Palpations: Abdomen is soft.     Tenderness: There is no abdominal tenderness. There is no guarding or rebound.  Musculoskeletal:        General: Normal range of motion.     Cervical back: Normal range of motion and neck supple.     Comments: No edema or calf tenderness  Lymphadenopathy:     Cervical: No cervical adenopathy.  Skin:    General: Skin is warm and dry.     Findings: No rash.  Neurological:     Mental Status: She is alert and oriented to person, place, and time.    ED Results / Procedures / Treatments   Labs (all labs ordered are listed, but only abnormal results are displayed) Labs Reviewed  BASIC METABOLIC PANEL - Abnormal; Notable for the following components:      Result Value   Glucose, Bld 116 (*)    Creatinine, Ser 1.02 (*)    Calcium 8.7 (*)    All other components within normal limits  CBC - Abnormal; Notable for the following components:   RBC 3.62 (*)    Hemoglobin 11.1 (*)     HCT 34.5 (*)    All other components within normal limits  TROPONIN I (HIGH SENSITIVITY)  TROPONIN I (HIGH SENSITIVITY)    EKG EKG Interpretation  Date/Time:  Friday September 28 2020 12:50:05 EDT Ventricular Rate:  57 PR Interval:  169 QRS Duration: 93 QT Interval:  419 QTC Calculation: 408 R Axis:   42 Text Interpretation: Sinus rhythm Low voltage, precordial leads since last tracing no significant change Confirmed by Rolan BuccoBelfi, Theadore Blunck [phone]([id number]) on 09/28/2020 1:42:14 PM  Radiology CT Angio Chest Aorta w/CM &/OR wo/CM  Result Date: 09/28/2020 CLINICAL DATA:  Chest pain EXAM: CT ANGIOGRAPHY CHEST WITHOUT AND WITH CONTRAST TECHNIQUE: Multidetector CT imaging of the chest was performed using the standard protocol before and during bolus administration of intravenous contrast. Multiplanar CT image reconstructions and MIPs were obtained to evaluate the vascular anatomy. CONTRAST:  75mL OMNIPAQUE IOHEXOL 350 MG/ML SOLN COMPARISON:  None. FINDINGS: Cardiovascular: There is no evidence of intramural hematoma or dissection. Thoracic aorta is normal in caliber. Mild calcified plaque. Heart size is normal. No pericardial effusion. Minor coronary artery calcification. Main pulmonary artery is normal in caliber. There is no main or lobar pulmonary embolus. Mediastinum/Nodes: No enlarged lymph nodes. Thyroid is unremarkable. Esophagus is unremarkable. Lungs/Pleura: There is a 5 x 2 (average diameter 3.5 mm) mm left lower lobe nodule (series 7, image 103). No consolidation. Mild scarring at the lung bases. No pleural effusion. No pneumothorax. Upper Abdomen: No acute abnormality. Musculoskeletal: Degenerative changes of the spine. Review of the MIP images confirms the above findings. IMPRESSION: No evidence of aortic dissection or other acute abnormality. 3 mm left lower lobe nodule. No follow-up needed if patient is low-risk. Non-contrast chest CT can be considered in 12 months if patient is high-risk. This  recommendation follows the consensus statement: Guidelines for Management of Incidental Pulmonary Nodules Detected on CT Images: From the Fleischner Society 2017; Radiology 2017; 284:228-243. Electronically Signed   By: Guadlupe SpanishPraneil  Patel M.D.   On: 09/28/2020 15:25    Procedures Procedures  Medications Ordered in ED Medications  iohexol (OMNIPAQUE) 350 MG/ML injection 75 mL (75 mLs Intravenous Contrast Given 09/28/20 1500)    ED Course  I have reviewed the triage vital signs and the nursing notes.  Pertinent labs & imaging results that were available during my care of the patient were reviewed by me and considered in my medical decision making (see chart for details).    MDM Rules/Calculators/A&P                          Patient is a 65 year old female who presents with chest pain.  Its sharp and left-sided.  There is some reproducibility of it with movement of the left arm.  However it did radiate to her back as well.  CT scan was performed which shows no evidence of dissection or PE.  Her first troponin is negative.  EKG does not show any ischemic changes.  There is a lung nodule on her CT which she will need outpatient follow-up for.  This was discussed with her.  Her creatinine is mildly elevated but similar to prior values.  Dr. Bernette Mayers to take over following the second troponin.  If this is negative, patient can likely be discharged home.  I advised her to follow-up with her PCP within the next few days.  She was instructed to use Tylenol for symptomatic relief.   Final Clinical Impression(s) / ED Diagnoses Final diagnoses:  Nonspecific chest pain  Lung nodule    Rx / DC Orders ED Discharge Orders     None        Rolan Bucco, MD 09/28/20 1621

## 2020-09-28 NOTE — Progress Notes (Signed)
Orthopedic Tech Progress Note Patient Details:  Holly Henry 20-Nov-1955 [id number]    Ortho Devices Type of Ortho Device: Shoulder immobilizer Ortho Device/Splint Location: LUE Ortho Device/Splint Interventions: Ordered, Application, Adjustment   Post Interventions Patient Tolerated: Well Instructions Provided: Care of device, Adjustment of device  Holly Henry Holly Henry 09/28/2020, 6:34 PM

## 2020-09-28 NOTE — ED Triage Notes (Signed)
Patient BIB [facility] for chest pain that is exacerbated by movement. Pain is 2/10 at rest, increases to 8/10 shooting with movement. Patient denies shortness of breath, nausea,dizziness. Pain does not radiate anywhere. Patient alert, oriented, and in no apparent distress at this time.

## 2020-09-28 NOTE — Discharge Instructions (Addendum)
Follow-up with your primary care doctor within the next few days.  You will need to have a repeat CT scan in about a year to reassess the lung nodule.  Return here as needed if you have any worsening symptoms.

## 2021-01-20 ENCOUNTER — Emergency Department (HOSPITAL_BASED_OUTPATIENT_CLINIC_OR_DEPARTMENT_OTHER)
Admission: EM | Admit: 2021-01-20 | Discharge: 2021-01-20 | Disposition: A | Discharge status: Home / Self Care | Payer: Medicare HMO | Attending: Emergency Medicine | Admitting: Emergency Medicine

## 2021-01-20 ENCOUNTER — Other Ambulatory Visit: Payer: Self-pay

## 2021-01-20 ENCOUNTER — Encounter (HOSPITAL_BASED_OUTPATIENT_CLINIC_OR_DEPARTMENT_OTHER): Payer: Self-pay | Admitting: Emergency Medicine

## 2021-01-20 DIAGNOSIS — S6992XA Unspecified injury of left wrist, hand and finger(s), initial encounter: Secondary | ICD-10-CM | POA: Diagnosis present

## 2021-01-20 DIAGNOSIS — S61211A Laceration without foreign body of left index finger without damage to nail, initial encounter: Secondary | ICD-10-CM

## 2021-01-20 DIAGNOSIS — I4891 Unspecified atrial fibrillation: Secondary | ICD-10-CM | POA: Insufficient documentation

## 2021-01-20 DIAGNOSIS — Z7901 Long term (current) use of anticoagulants: Secondary | ICD-10-CM | POA: Insufficient documentation

## 2021-01-20 DIAGNOSIS — Z79899 Other long term (current) drug therapy: Secondary | ICD-10-CM | POA: Diagnosis not present

## 2021-01-20 DIAGNOSIS — W260XXA Contact with knife, initial encounter: Secondary | ICD-10-CM | POA: Diagnosis not present

## 2021-01-20 DIAGNOSIS — I10 Essential (primary) hypertension: Secondary | ICD-10-CM | POA: Insufficient documentation

## 2021-01-20 DIAGNOSIS — Z23 Encounter for immunization: Secondary | ICD-10-CM | POA: Diagnosis not present

## 2021-01-20 MED ORDER — BACITRACIN ZINC 500 UNIT/GM EX OINT: TOPICAL_OINTMENT | Freq: Two times a day (BID) | CUTANEOUS | Status: DC

## 2021-01-20 MED ORDER — LIDOCAINE HCL (PF) 1 % IJ SOLN
10.0000 mL | Freq: Once | INTRAMUSCULAR | Status: DC
  Filled 2021-01-20: qty 10

## 2021-01-20 MED ORDER — TETANUS-DIPHTH-ACELL PERTUSSIS 5-2.5-18.5 LF-MCG/0.5 IM SUSY
0.5000 mL | PREFILLED_SYRINGE | Freq: Once | INTRAMUSCULAR | Status: AC
  Administered 2021-01-20: 0.5 mL via INTRAMUSCULAR
  Filled 2021-01-20: qty 0.5

## 2021-01-20 NOTE — ED Provider Notes (Signed)
[facility] EMERGENCY DEPARTMENT Provider Note   CSN: [id number] Arrival date & time: 01/20/21  1652     History Chief Complaint  Patient presents with   Laceration    Sable Knoles is a 65 y.o. female with a past medical history significant for atrial fibrillation chronically on warfarin whose last INR was 1.7 who presents with a laceration to the distal phalanx of the left index finger.  Patient reports she was trying to open up plastic package with a sharp butter knife when the knife slipped and cut her finger.  Patient reports this happened 30 minutes prior to arrival, bleeding was controlled with pressure at the time of evaluation.  Patient reports she is in minimal pain, has had no numbness or tingling of the finger and has been able to move the finger without difficulty.  Patient believes her last tetanus shot was in the last 5 years, however she has not 100% sure.   Laceration     Past Medical History:  Diagnosis Date   A-fib (HCC)    Fibromyalgia    High cholesterol    Hypertension    Osteoarthritis    Prediabetes    Rheumatoid arthritis (HCC)     There are no problems to display for this patient.   Past Surgical History:  Procedure Laterality Date   BACK SURGERY     CATARACT EXTRACTION Bilateral    TONSILLECTOMY       OB History   No obstetric history on file.     No family history on file.  Social History   Tobacco Use   Smoking status: Never   Smokeless tobacco: Never  Substance Use Topics   Alcohol use: Never   Drug use: Never    Home Medications Prior to Admission medications   Medication Sig Start Date End Date Taking? Authorizing Provider  acetaminophen (TYLENOL) 500 MG tablet Take 1,000 mg by mouth See admin instructions. Take 2 tablets (1000 mg) by mouth twice daily - morning and mid-afternoon    [provider]  albuterol (PROVENTIL) (2.5 MG/3ML) 0.083% nebulizer solution Take 2.5 mg by nebulization every 6 (six) hours  as needed for wheezing or shortness of breath (allergies).    [provider]  albuterol (VENTOLIN HFA) 108 (90 Base) MCG/ACT inhaler Inhale 2 puffs into the lungs every 6 (six) hours as needed for wheezing or shortness of breath (allergies). 04/25/20   [provider]  apixaban (ELIQUIS) 5 MG TABS tablet Take 5 mg by mouth 2 (two) times daily.    [provider]  atorvastatin (LIPITOR) 80 MG tablet Take 80 mg by mouth in the morning. 07/13/20   [provider]  baclofen (LIORESAL) 10 MG tablet Take 10 mg by mouth at bedtime. 07/20/20   [provider]  busPIRone (BUSPAR) 30 MG tablet Take 30 mg by mouth 2 (two) times daily. 09/18/20   [provider]  diclofenac Sodium (VOLTAREN) 1 % GEL Apply 1 application topically 2 (two) times daily as needed (arthritis pain).    [provider]  ezetimibe (ZETIA) 10 MG tablet Take 10 mg by mouth in the morning. 07/28/20   [provider]  folic acid (FOLVITE) 1 MG tablet Take 1 mg by mouth at bedtime. 09/27/20   [provider]  furosemide (LASIX) 20 MG tablet Take 1 tablet (20 mg total) by mouth daily. Patient taking differently: Take 20 mg by mouth daily as needed for fluid or edema (feet swelling). 06/21/20  Horton, Mayer Masker, MD  gabapentin (NEURONTIN) 600 MG tablet Take 600 mg by mouth 3 (three) times daily. 09/22/20   [provider]  hydrochlorothiazide (HYDRODIURIL) 25 MG tablet Take 25 mg by mouth in the morning. 07/13/20   [provider]  latanoprost (XALATAN) 0.005 % ophthalmic solution Place 1 drop into both eyes at bedtime. 08/10/20   [provider]  loratadine (CLARITIN) 10 MG tablet Take 10 mg by mouth every morning. 05/02/20   [provider]  methotrexate (RHEUMATREX) 2.5 MG tablet Take 15 mg by mouth every Thursday. 05/28/20   [provider]  metoprolol tartrate (LOPRESSOR) 25 MG tablet Take 25 mg by mouth 2 (two) times daily.  09/12/20   [provider]  montelukast (SINGULAIR) 10 MG tablet Take 10 mg by mouth at bedtime. 07/17/20   [provider]  Multiple Vitamin (MULTIVITAMIN WITH MINERALS) TABS tablet Take 1 tablet by mouth daily.    [provider]  naproxen sodium (ALEVE) 220 MG tablet Take 440 mg by mouth daily as needed (pain).    [provider]  nystatin-triamcinolone ointment (MYCOLOG) Apply 1 application topically See admin instructions. Apply topically to perineal area up to three times daily as needed for rash/itching. 02/20/20   [provider]  oxyCODONE (OXY IR/ROXICODONE) 5 MG immediate release tablet Take 5 mg by mouth 2 (two) times daily as needed for breakthrough pain. 09/21/20   [provider]  pantoprazole (PROTONIX) 40 MG tablet Take 40 mg by mouth every morning. 09/10/20   [provider]  traMADol (ULTRAM-ER) 300 MG 24 hr tablet Take 300 mg by mouth every morning. 09/12/20   [provider]  traZODone (DESYREL) 50 MG tablet Take 75 mg by mouth at bedtime. 08/20/20   [provider]    Allergies    Patient has no known allergies.  Review of Systems   Review of Systems  Skin:  Positive for wound.  All other systems reviewed and are negative.  Physical Exam Updated Vital Signs BP (!) 172/84   Pulse 60   Temp 97.9 F (36.6 C) (Oral)   Resp 20   Ht 5\' 1"  (1.549 m)   Wt 70.3 kg   SpO2 98%   BMI 29.29 kg/m   Physical Exam Vitals and nursing note reviewed.  Constitutional:      General: She is not in acute distress.    Appearance: Normal appearance.  HENT:     Head: Normocephalic and atraumatic.  Eyes:     General:        Right eye: No discharge.        Left eye: No discharge.  Cardiovascular:     Rate and Rhythm: Normal rate and regular rhythm.  Pulmonary:     Effort: Pulmonary effort is normal. No respiratory distress.  Musculoskeletal:        General: No deformity.     Comments: Intact 5 out of 5  strength of flexion extension of the left index finger.  No evidence of tendon injury noted in the laceration which was visualized fully.  Skin:    General: Skin is warm and dry.     Capillary Refill: Capillary refill takes less than 2 seconds.     Comments: Approximately 2.5 cm linear laceration on the distal phalanx of the left index finger on the volar surface.  Laceration is somewhat laterally located, full wound length was visualized and no evidence of tendon presence or injury.  Neurological:  Mental Status: She is alert and oriented to person, place, and time.     Sensory: No sensory deficit.  Psychiatric:        Mood and Affect: Mood normal.        Behavior: Behavior normal.    ED Results / Procedures / Treatments   Labs (all labs ordered are listed, but only abnormal results are displayed) Labs Reviewed - No data to display  EKG None  Radiology No results found.  Procedures .Marland KitchenLaceration Repair  Date/Time: 01/20/2021 7:38 PM Performed by: Olene Floss, PA-C Authorized by: Olene Floss, PA-C   Consent:    Consent obtained:  Verbal   Risks, benefits, and alternatives were discussed: yes     Risks discussed:  Infection, pain, poor cosmetic result and nerve damage   Alternatives discussed:  No treatment Universal protocol:    Procedure explained and questions answered to patient or proxy's satisfaction: yes     Patient identity confirmed:  Verbally with patient Anesthesia:    Anesthesia method:  Nerve block   Block location:  Left index finger   Block needle gauge:  25 G   Block anesthetic:  Lidocaine 1% w/o epi   Block technique:  Modified transthecal   Block injection procedure:  Introduced needle, anatomic landmarks identified and negative aspiration for blood   Block outcome:  Anesthesia achieved Laceration details:    Location:  Finger   Finger location:  L index finger   Length (cm):  2.5   Depth (mm):  3 Exploration:    Contaminated:  no   Treatment:    Area cleansed with:  Povidone-iodine and saline   Amount of cleaning:  Standard   Irrigation solution:  Sterile saline Skin repair:    Repair method:  Sutures   Suture size:  5-0   Suture material:  Chromic gut   Suture technique:  Simple interrupted   Number of sutures:  4 Approximation:    Approximation:  Close Repair type:    Repair type:  Simple Post-procedure details:    Dressing:  Non-adherent dressing and antibiotic ointment   Procedure completion:  Tolerated   Medications Ordered in ED Medications  lidocaine (PF) (XYLOCAINE) 1 % injection 10 mL (has no administration in time range)  bacitracin ointment (has no administration in time range)  Tdap (BOOSTRIX) injection 0.5 mL (0.5 mLs Intramuscular Given 01/20/21 1819)    ED Course  I have reviewed the triage vital signs and the nursing notes.  Pertinent labs & imaging results that were available during my care of the patient were reviewed by me and considered in my medical decision making (see chart for details).    MDM Rules/Calculators/A&P                         Laceration was repaired as described above.  Bleeding was controlled with ligature, pressure dressing.  Minimal blood loss.  Entire wound length was visualized, minimal concern for any tendon injury at this time.  Patient had intact range of motion, intact strength of flexion extension which further decreases my clinical suspicion for any tendon injury.  Wound was thoroughly cleaned and irrigated, bandaged with antibacterial ointment. Neurovascularly intact.  Patient instructed that she may remove the sutures in 14 days, however they are dissolvable.  Patient Final Clinical Impression(s) / ED Diagnoses Final diagnoses:  Laceration of left index finger without foreign body without damage to nail, initial encounter    Rx /  DC Orders ED Discharge Orders     None        Olene Floss, PA-C 01/20/21 1940    Virgina Norfolk,  DO 01/20/21 2312

## 2021-01-20 NOTE — Discharge Instructions (Addendum)
Continue to keep his area bandaged with antibacterial ointment such as Polysporin, Neosporin.  As we discussed I put dissolving stitches in your finger however you can have these removed in around 2 weeks, they may take up to 2 to 3 months to dissolve if you do not have them removed.  Please monitor for signs of infection including increasing pain, redness, draining pus.  Please follow-up for further evaluation if you do notice signs of infection, or decreased use of the affected finger.

## 2021-01-20 NOTE — ED Triage Notes (Signed)
Laceration to LT index finger with butter knife today; pt on blood thinner; bleeding controlled at present

## 2021-02-28 ENCOUNTER — Ambulatory Visit: Payer: Medicare HMO | Attending: Internal Medicine

## 2021-02-28 ENCOUNTER — Other Ambulatory Visit (HOSPITAL_BASED_OUTPATIENT_CLINIC_OR_DEPARTMENT_OTHER): Payer: Self-pay

## 2021-02-28 DIAGNOSIS — Z23 Encounter for immunization: Secondary | ICD-10-CM

## 2021-02-28 MED ORDER — SHINGRIX 50 MCG/0.5ML IM SUSR
INTRAMUSCULAR | 1 refills | Status: DC
  Filled 2021-02-28: qty 0.5, 1d supply, fill #0

## 2021-02-28 MED ORDER — INFLUENZA VAC A&B SA ADJ QUAD 0.5 ML IM PRSY
PREFILLED_SYRINGE | INTRAMUSCULAR | 0 refills | Status: DC
  Filled 2021-02-28: qty 0.5, 1d supply, fill #0

## 2021-02-28 NOTE — Progress Notes (Signed)
   Covid-19 Vaccination Clinic  Name:  Holly Henry    MRN: [id number] DOB: [birth year]  02/28/2021  [name] was observed post Covid-19 immunization for 15 minutes without incident. She was provided with Vaccine Information Sheet and instruction to access the V-Safe system.   [name] was instructed to call [phone] with any severe reactions post vaccine: Difficulty breathing  Swelling of face and throat  A fast heartbeat  A bad rash all over body  Dizziness and weakness   Immunizations Administered     Name Date Dose VIS Date Route   Pfizer Covid-19 Vaccine Bivalent Booster 02/28/2021 12:52 PM 0.3 mL 12/12/2020 Intramuscular   Manufacturer: [employer], [employer]   Lot: [id number]   NDC: [phone]

## 2021-03-16 ENCOUNTER — Other Ambulatory Visit (HOSPITAL_BASED_OUTPATIENT_CLINIC_OR_DEPARTMENT_OTHER): Payer: Self-pay

## 2021-03-16 MED ORDER — PFIZER COVID-19 VAC BIVALENT 30 MCG/0.3ML IM SUSP
INTRAMUSCULAR | 0 refills | Status: DC
  Filled 2021-03-16: qty 0.3, 1d supply, fill #0

## 2021-03-18 ENCOUNTER — Other Ambulatory Visit (HOSPITAL_BASED_OUTPATIENT_CLINIC_OR_DEPARTMENT_OTHER): Payer: Self-pay

## 2021-06-29 ENCOUNTER — Emergency Department (HOSPITAL_COMMUNITY): Payer: Medicare HMO

## 2021-06-29 ENCOUNTER — Other Ambulatory Visit: Payer: Self-pay

## 2021-06-29 ENCOUNTER — Encounter (HOSPITAL_COMMUNITY): Payer: Self-pay

## 2021-06-29 ENCOUNTER — Emergency Department (HOSPITAL_COMMUNITY)
Admission: EM | Admit: 2021-06-29 | Discharge: 2021-06-29 | Disposition: A | Discharge status: Home / Self Care | Payer: Medicare HMO | Attending: Emergency Medicine | Admitting: Emergency Medicine

## 2021-06-29 DIAGNOSIS — R410 Disorientation, unspecified: Secondary | ICD-10-CM | POA: Insufficient documentation

## 2021-06-29 DIAGNOSIS — R29810 Facial weakness: Secondary | ICD-10-CM | POA: Insufficient documentation

## 2021-06-29 DIAGNOSIS — I10 Essential (primary) hypertension: Secondary | ICD-10-CM | POA: Insufficient documentation

## 2021-06-29 DIAGNOSIS — G459 Transient cerebral ischemic attack, unspecified: Secondary | ICD-10-CM | POA: Diagnosis not present

## 2021-06-29 DIAGNOSIS — Z79899 Other long term (current) drug therapy: Secondary | ICD-10-CM | POA: Insufficient documentation

## 2021-06-29 DIAGNOSIS — R4781 Slurred speech: Secondary | ICD-10-CM | POA: Diagnosis not present

## 2021-06-29 DIAGNOSIS — Z20822 Contact with and (suspected) exposure to covid-19: Secondary | ICD-10-CM | POA: Insufficient documentation

## 2021-06-29 DIAGNOSIS — K5900 Constipation, unspecified: Secondary | ICD-10-CM | POA: Insufficient documentation

## 2021-06-29 DIAGNOSIS — R5383 Other fatigue: Secondary | ICD-10-CM | POA: Diagnosis not present

## 2021-06-29 DIAGNOSIS — Z7901 Long term (current) use of anticoagulants: Secondary | ICD-10-CM | POA: Insufficient documentation

## 2021-06-29 LAB — DIFFERENTIAL
Abs Immature Granulocytes: 0.02 10*3/uL (ref 0.00–0.07)
Basophils Absolute: 0.1 10*3/uL (ref 0.0–0.1)
Basophils Relative: 1 %
Eosinophils Absolute: 0.1 10*3/uL (ref 0.0–0.5)
Eosinophils Relative: 1 %
Immature Granulocytes: 0 %
Lymphocytes Relative: 16 %
Lymphs Abs: 1.6 10*3/uL (ref 0.7–4.0)
Monocytes Absolute: 0.2 10*3/uL (ref 0.1–1.0)
Monocytes Relative: 2 %
Neutro Abs: 7.7 10*3/uL (ref 1.7–7.7)
Neutrophils Relative %: 80 %

## 2021-06-29 LAB — I-STAT CHEM 8, ED
BUN: 23 mg/dL (ref 8–23)
Calcium, Ion: 1.12 mmol/L — ABNORMAL LOW (ref 1.15–1.40)
Chloride: 105 mmol/L (ref 98–111)
Creatinine, Ser: 0.7 mg/dL (ref 0.44–1.00)
Glucose, Bld: 134 mg/dL — ABNORMAL HIGH (ref 70–99)
HCT: 42 % (ref 36.0–46.0)
Hemoglobin: 14.3 g/dL (ref 12.0–15.0)
Potassium: 3.7 mmol/L (ref 3.5–5.1)
Sodium: 142 mmol/L (ref 135–145)
TCO2: 28 mmol/L (ref 22–32)

## 2021-06-29 LAB — RESP PANEL BY RT-PCR (FLU A&B, COVID) ARPGX2
Influenza A by PCR: NEGATIVE
Influenza B by PCR: NEGATIVE
SARS Coronavirus 2 by RT PCR: NEGATIVE

## 2021-06-29 LAB — CBC
HCT: 41 % (ref 36.0–46.0)
Hemoglobin: 13.4 g/dL (ref 12.0–15.0)
MCH: 30.2 pg (ref 26.0–34.0)
MCHC: 32.7 g/dL (ref 30.0–36.0)
MCV: 92.3 fL (ref 80.0–100.0)
Platelets: 266 10*3/uL (ref 150–400)
RBC: 4.44 MIL/uL (ref 3.87–5.11)
RDW: 13.6 % (ref 11.5–15.5)
WBC: 9.6 10*3/uL (ref 4.0–10.5)
nRBC: 0 % (ref 0.0–0.2)

## 2021-06-29 LAB — URINALYSIS, ROUTINE W REFLEX MICROSCOPIC
Bilirubin Urine: NEGATIVE
Glucose, UA: NEGATIVE mg/dL
Hgb urine dipstick: NEGATIVE
Ketones, ur: 5 mg/dL — AB
Nitrite: NEGATIVE
Protein, ur: NEGATIVE mg/dL
Specific Gravity, Urine: 1.014 (ref 1.005–1.030)
pH: 7 (ref 5.0–8.0)

## 2021-06-29 LAB — COMPREHENSIVE METABOLIC PANEL
ALT: 18 U/L (ref 0–44)
AST: 14 U/L — ABNORMAL LOW (ref 15–41)
Albumin: 3.8 g/dL (ref 3.5–5.0)
Alkaline Phosphatase: 70 U/L (ref 38–126)
Anion gap: 11 (ref 5–15)
BUN: 19 mg/dL (ref 8–23)
CO2: 25 mmol/L (ref 22–32)
Calcium: 9.4 mg/dL (ref 8.9–10.3)
Chloride: 105 mmol/L (ref 98–111)
Creatinine, Ser: 0.93 mg/dL (ref 0.44–1.00)
GFR, Estimated: >60 mL/min (ref >60)
Glucose, Bld: 139 mg/dL — ABNORMAL HIGH (ref 70–99)
Potassium: 3.7 mmol/L (ref 3.5–5.1)
Sodium: 141 mmol/L (ref 135–145)
Total Bilirubin: 2.4 mg/dL — ABNORMAL HIGH (ref 0.3–1.2)
Total Protein: 6.8 g/dL (ref 6.5–8.1)

## 2021-06-29 LAB — RAPID URINE DRUG SCREEN, HOSP PERFORMED
Amphetamines: NOT DETECTED
Barbiturates: NOT DETECTED
Benzodiazepines: NOT DETECTED
Cocaine: NOT DETECTED
Opiates: POSITIVE — AB
Tetrahydrocannabinol: NOT DETECTED

## 2021-06-29 LAB — PROTIME-INR
INR: 1.1 (ref 0.8–1.2)
Prothrombin Time: 14.3 seconds (ref 11.4–15.2)

## 2021-06-29 LAB — APTT: aPTT: 28 seconds (ref 24–36)

## 2021-06-29 LAB — ETHANOL: Alcohol, Ethyl (B): <10 mg/dL (ref <10)

## 2021-06-29 IMAGING — MR MR MRA NECK WO/W CM
4 of 7 series · 17 of 48 positions shown · IV contrast (6.6 GADAVIST)
Comparison: Head CT [VW] hours today.

CLINICAL DATA: 65-year-old female TIA.

EXAM:
MRI HEAD WITHOUT CONTRAST
MRA HEAD WITHOUT CONTRAST
MRA NECK WITHOUT AND WITH CONTRAST
TECHNIQUE: Multiplanar, multiecho pulse sequences of the brain and surrounding
structures were obtained without contrast. Angiographic images of
the Circle of Willis were obtained using MRA technique without
intravenous contrast. Angiographic images of the neck were obtained
using MRA technique without and with intravenous contrast. Carotid
stenosis measurements (when applicable) are obtained utilizing
NASCET criteria, using the distal internal carotid diameter as the
denominator.
CONTRAST:  7 mL Gadavist

[Series 1300: cor cemra ft · coronal · 1.2mm · 0.59mm/px · 8 of 105 slices shown]
[im 1/105]
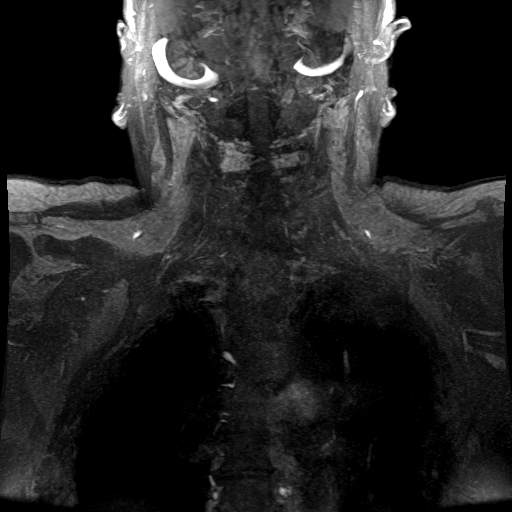
[im 15/105]
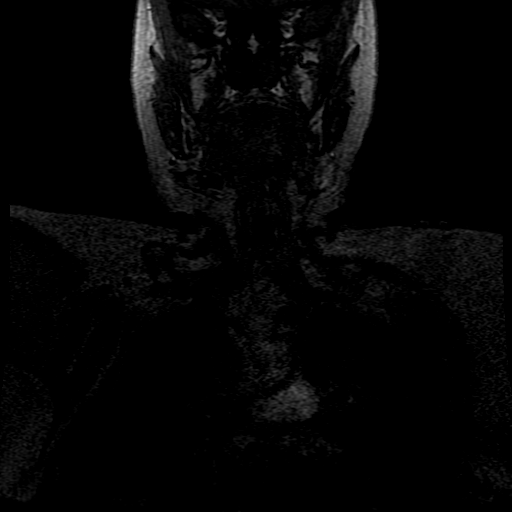
[im 30/105]
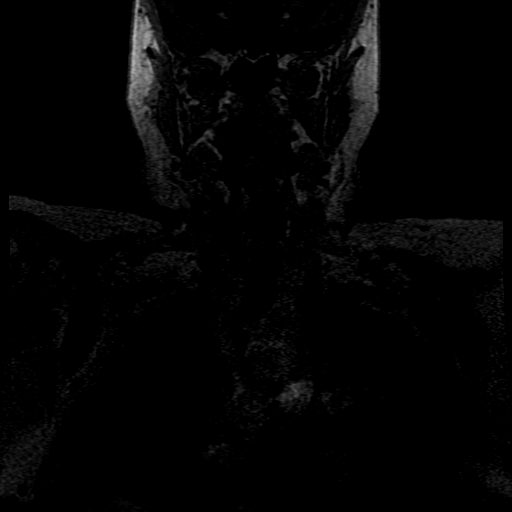
[im 45/105]
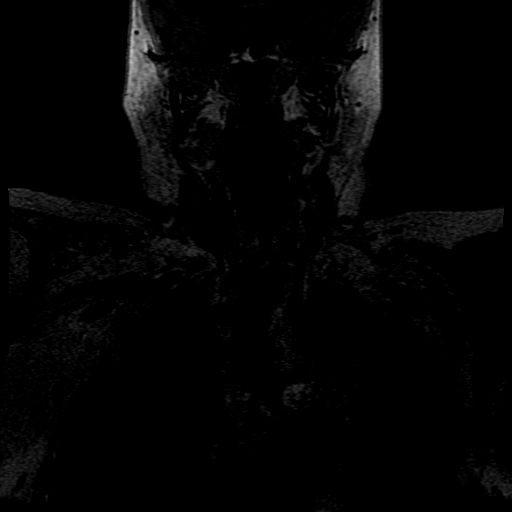
[im 60/105]
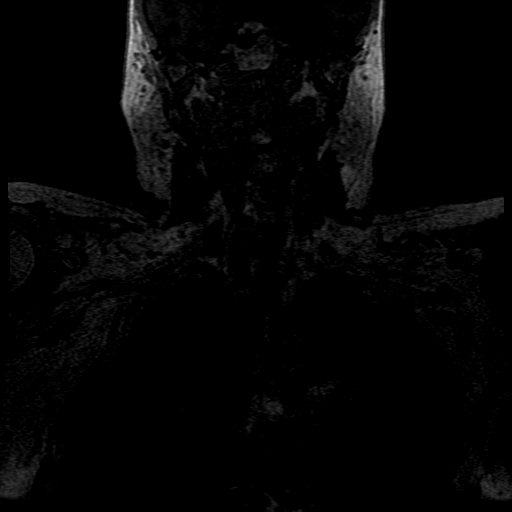
[im 75/105]
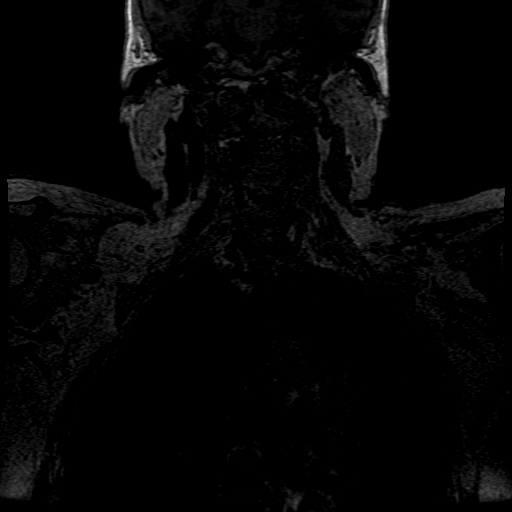
[im 90/105]
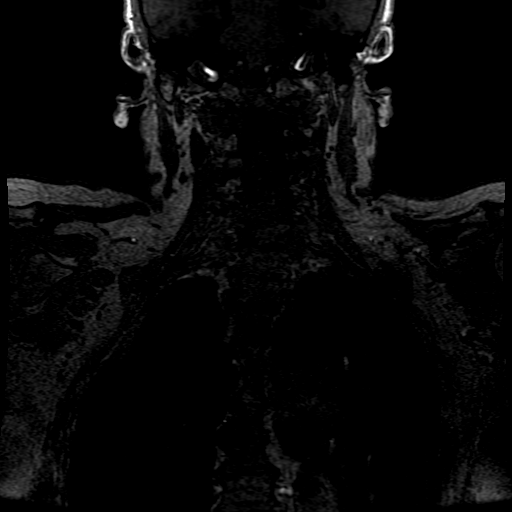
[im 105/105]
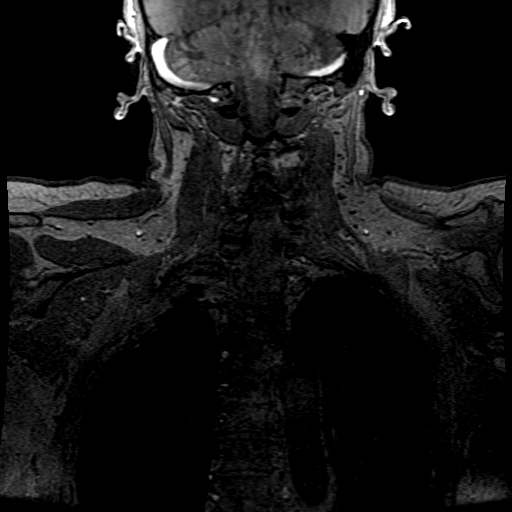

[Series 1301: ph1/cor cemra ft · coronal · 1.2mm · 0.59mm/px · 3 of 101 slices shown]
[im 17/101]
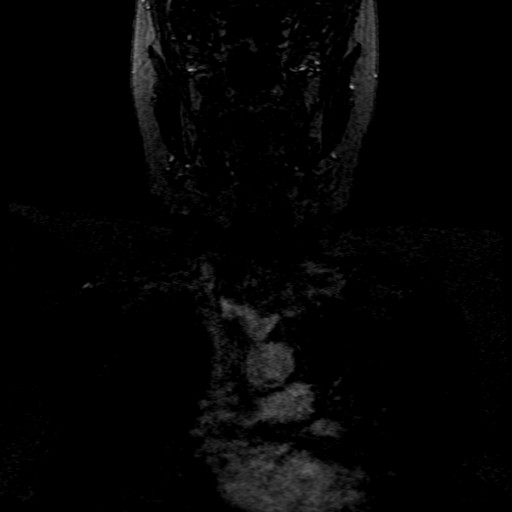
[im 51/101]
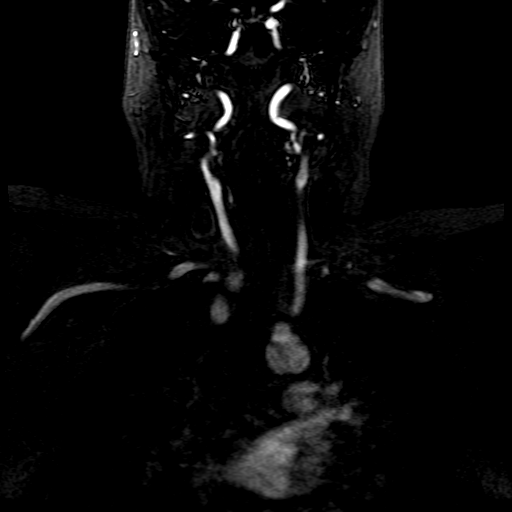
[im 84/101]
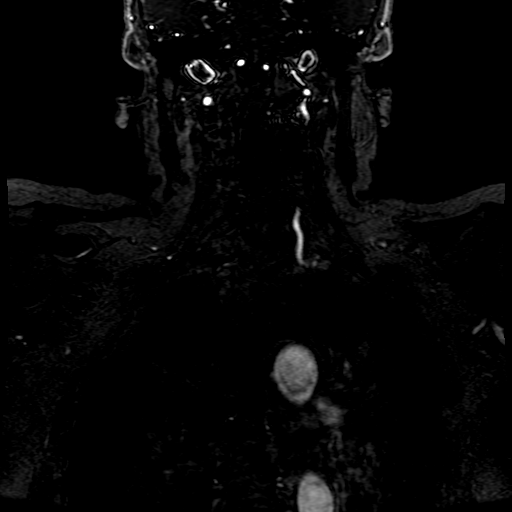

[Series 1302: ph2/cor cemra ft · coronal · 1.2mm · 0.59mm/px · 3 of 104 slices shown]
[im 15/104]
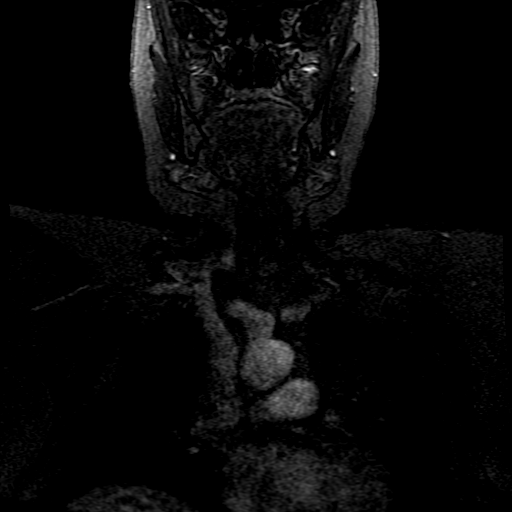
[im 59/104]
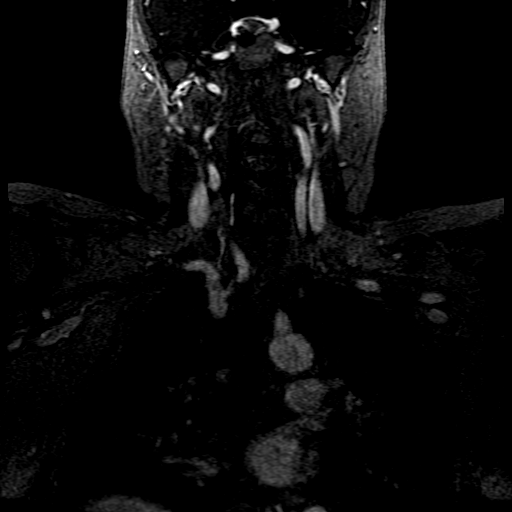
[im 89/104]
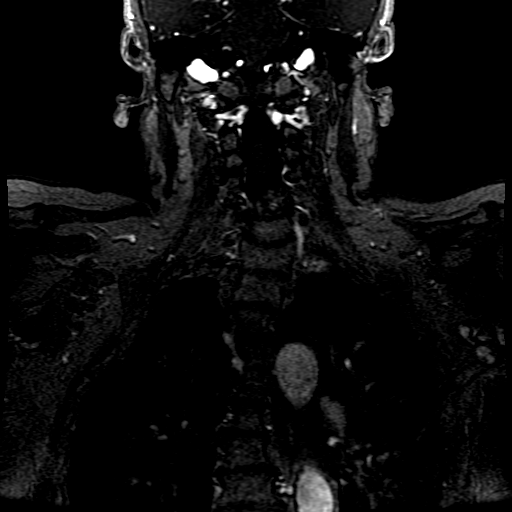

[((date))-((date)) · coronal · 1.2mm · 0.59mm/px · 3 of 105 slices shown]
[im 15/105]
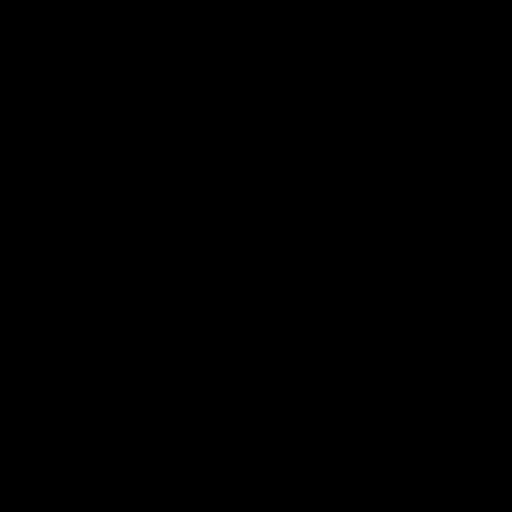
[im 60/105]
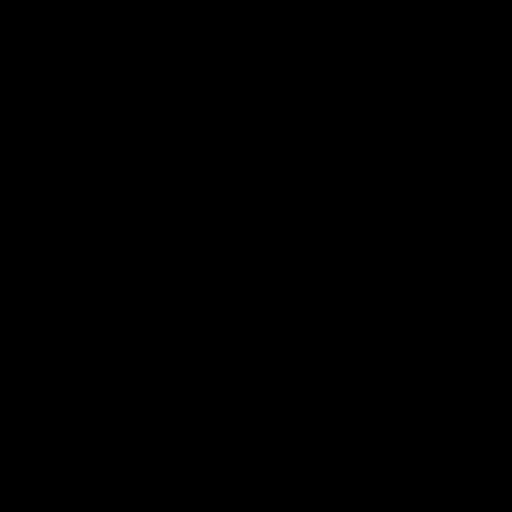
[im 90/105]
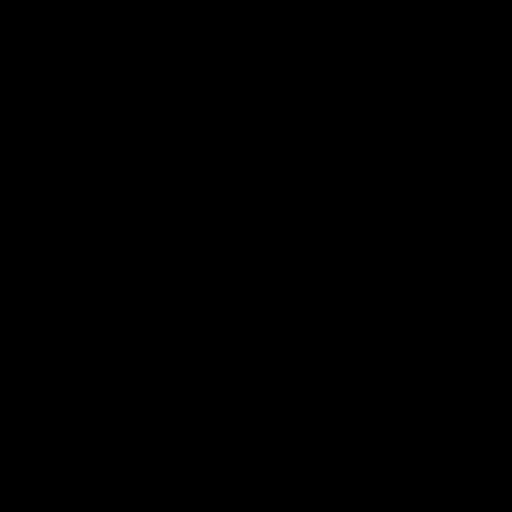

[17 of 48 positions shown; findings below may reference images not displayed]

FINDINGS: MRI HEAD FINDINGS

Brain: No restricted diffusion to suggest acute infarction. No
midline shift, mass effect, evidence of mass lesion,
ventriculomegaly, extra-axial collection or acute intracranial
hemorrhage. Cervicomedullary junction and pituitary are within
normal limits.

Chronic lacunar infarcts scattered in the bilateral corona radiata
and basal ganglia. Mild to moderate T2 heterogeneity in the
bilateral thalami and pons. No chronic cerebral blood products.
Moderate for age additional scattered bilateral cerebral white
matter T2 and FLAIR hyperintensity. No cortical encephalomalacia.
Negative cerebellum.

Vascular:  Major intracranial vascular flow voids are preserved.

Skull and upper cervical spine: Negative for age visible cervical
spine. Visualized bone marrow signal is within normal limits.

Sinuses/Orbits: Postoperative changes to both globes. Paranasal
sinuses and mastoids are stable and well aerated.

Other: Visible internal auditory structures appear normal. Negative
visible scalp and face.

MRA NECK FINDINGS

Pre contrast time-of-flight images demonstrate antegrade flow in the
bilateral cervical carotid and vertebral arteries. Carotid
bifurcations are patent. The right vertebral artery appears somewhat
dominant throughout.

Post-contrast neck MRA images suggest a bovine type arch
configuration. Patent proximal great vessels.

Mildly tortuous proximal right CCA. Patent right carotid bifurcation
without stenosis. Negative cervical right ICA.

Mildly tortuous proximal left CCA without stenosis. Patent left
carotid bifurcation. Minimal irregularity at the left ICA origin. No
cervical left ICA stenosis.

Patent proximal subclavian arteries and vertebral artery origins.
Both vertebral arteries are patent to the skull base with mild
tortuosity. The right side is dominant. No vertebral artery stenosis
identified.

MRA HEAD FINDINGS

Antegrade flow in the posterior circulation with dominant right V4
segment. Patent PICA origins and no distal vertebral or
vertebrobasilar junction stenosis. Patent basilar artery without
stenosis. Patent SCA and PCA origins. Posterior communicating
arteries are diminutive or absent. Bilateral PCA branches are within
normal limits.

Antegrade flow in both ICA siphons. Mildly dominant left siphon with
patent carotid termini and no siphon stenosis. Dominant left and
diminutive right ACA A1 segments. Normal anterior communicating
artery. Visible ACA branches are within normal limits. MCA M1
segments are patent without stenosis. Mild tortuosity on the left.
Patent MCA bi/trifurcations without stenosis. Visible MCA branches
are within normal limits.
IMPRESSION: 1. No acute intracranial abnormality. Moderate for age chronic small
vessel disease.

2. Head and Neck MRA with only evidence of only mild atherosclerosis
and no arterial stenosis.

## 2021-06-29 IMAGING — CT CT HEAD W/O CM
4 series · 17 of 47 positions shown, 19 images · non-contrast
Comparison: None.

CLINICAL DATA: Transient ischemic attack



[Series 3: head wo · axial · 0.39mm/px · z∈[-198,-73]mm · 7 of 35 slices shown, 9 images]
[im 5/35  brain]
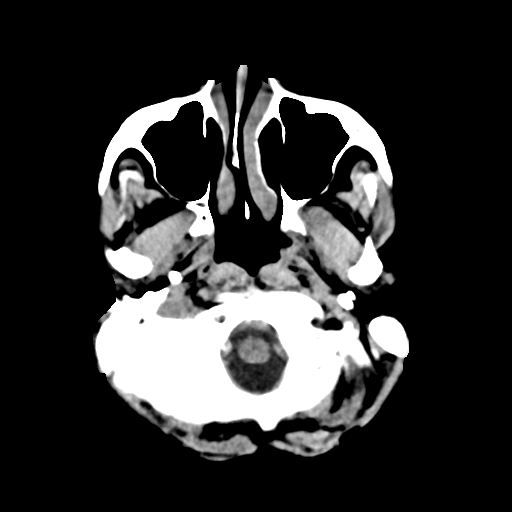
[im 5/35  bone]
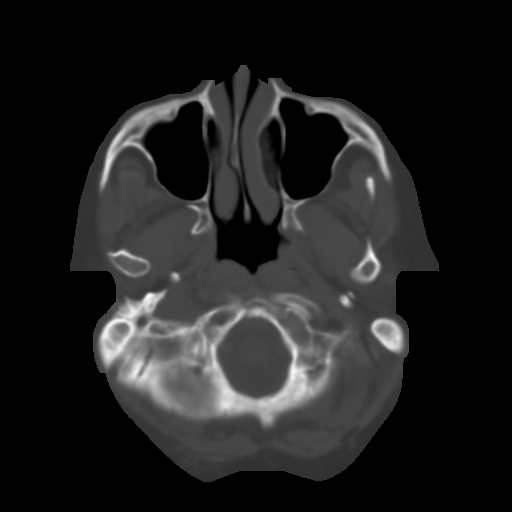
[im 9/35  brain]
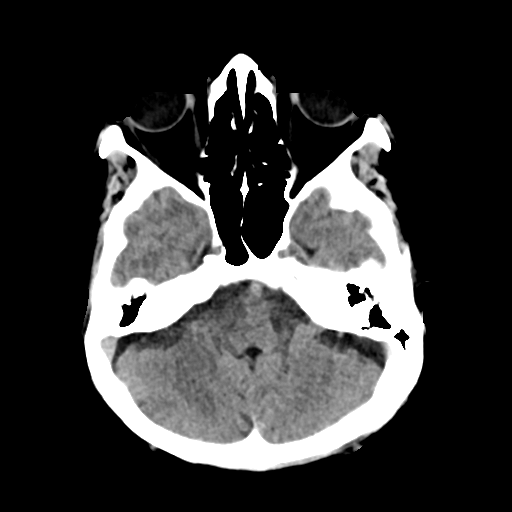
[im 13/35  brain]
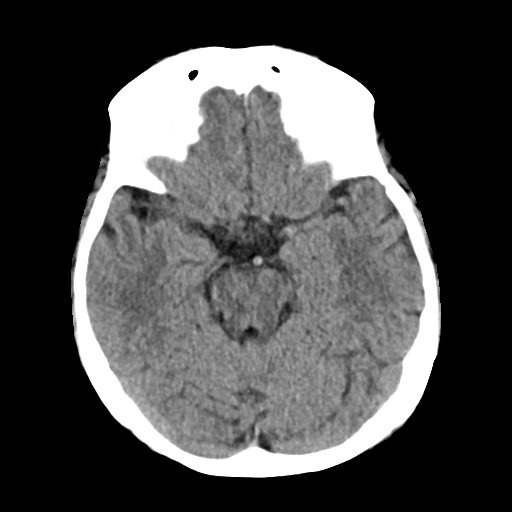
[im 18/35  brain]
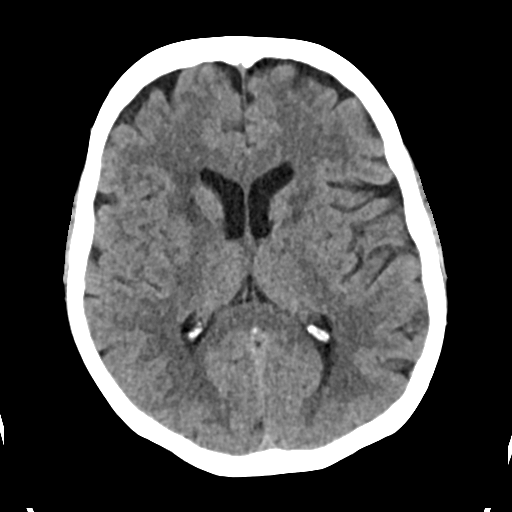
[im 22/35  brain]
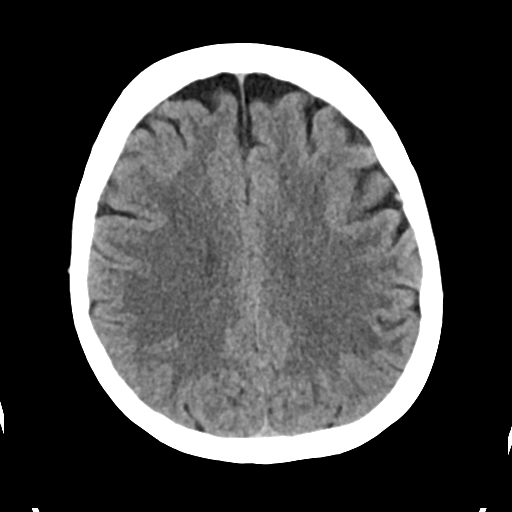
[im 22/35  bone]
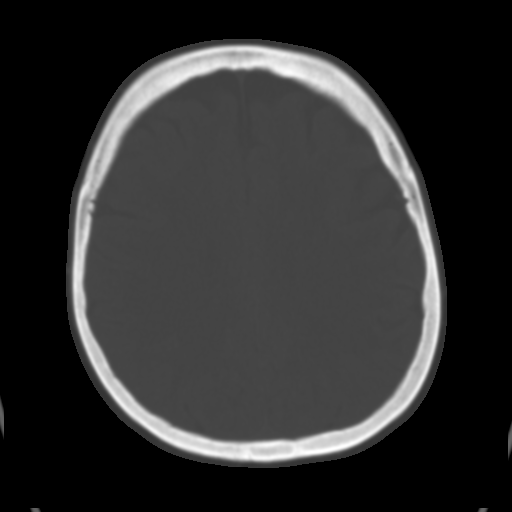
[im 26/35  brain]
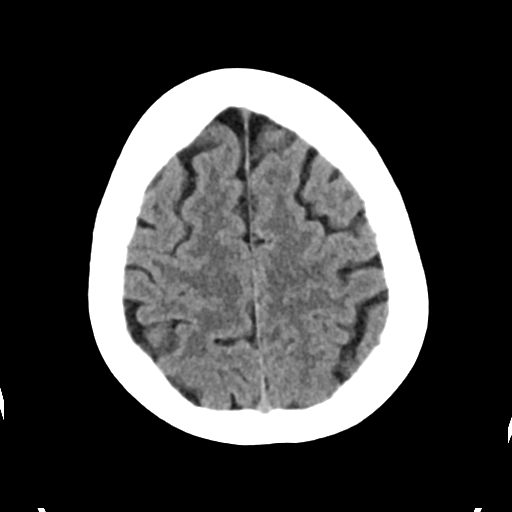
[im 30/35  brain]
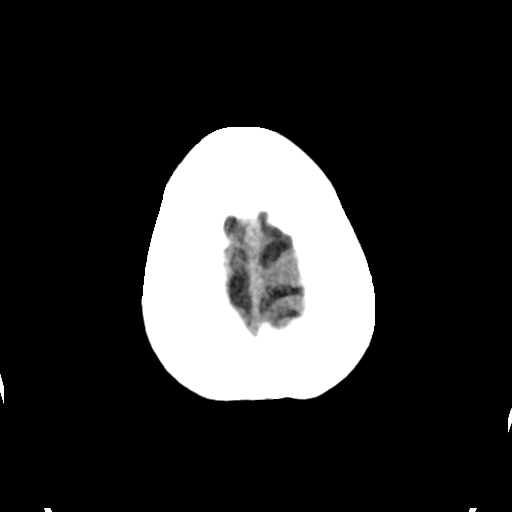

[Series 4: head bone · axial · 0.39mm/px · z∈[-202,-142]mm · 4 of 86 slices shown]
[im 9/86  bone]
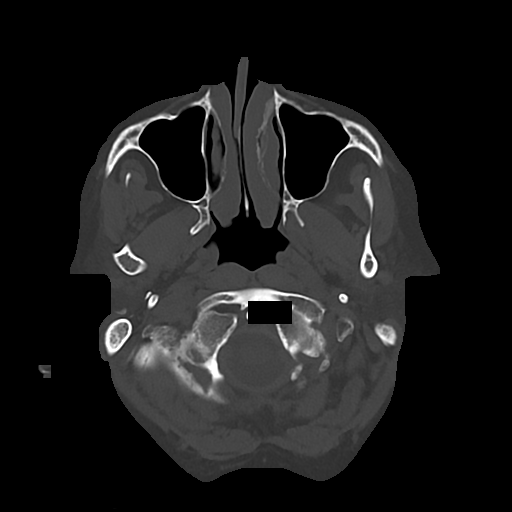
[im 18/86  bone]
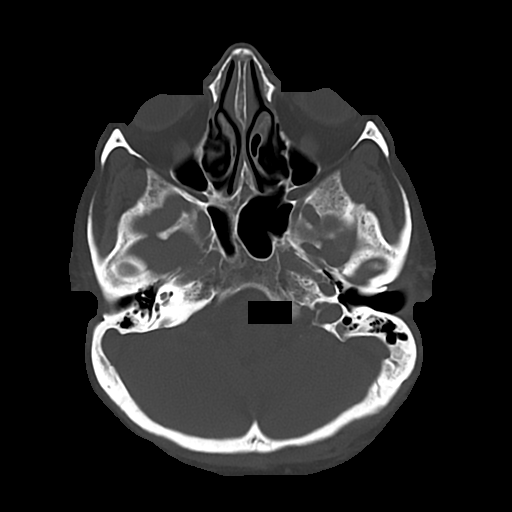
[im 26/86  bone]
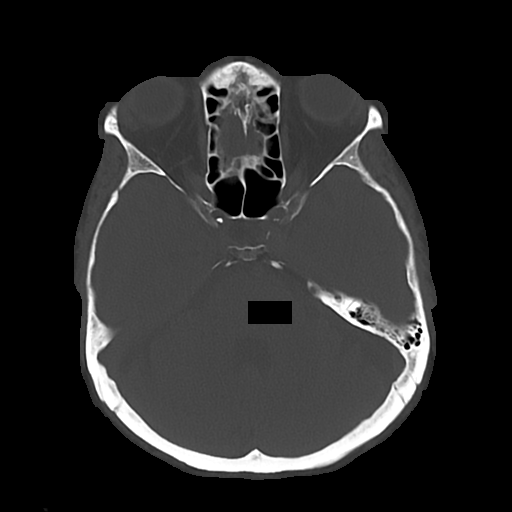
[im 39/86  bone]
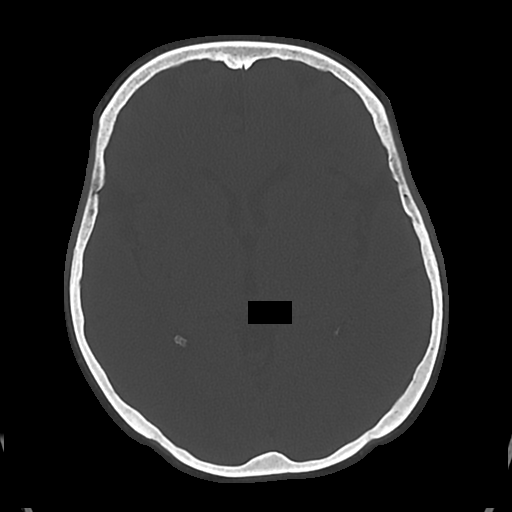

[Series 5: cor soft · coronal · 0.31mm/px · 3 of 62 slices shown]
[im 21/62  brain]
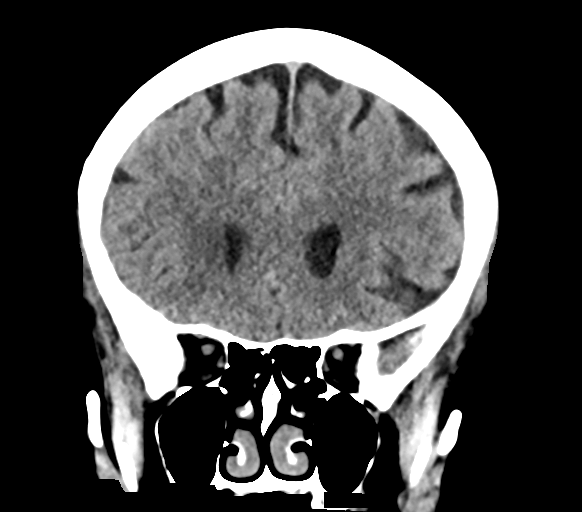
[im 28/62  brain]
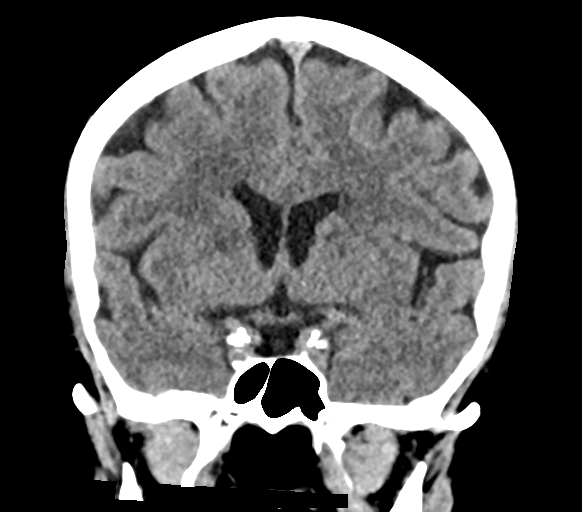
[im 34/62  brain]
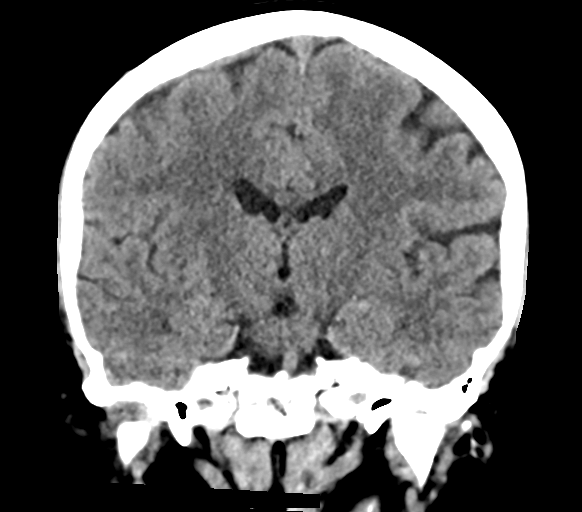

[Series 6: sag soft · sagittal · 0.35mm/px · 3 of 62 slices shown]
[im 21/62  brain]
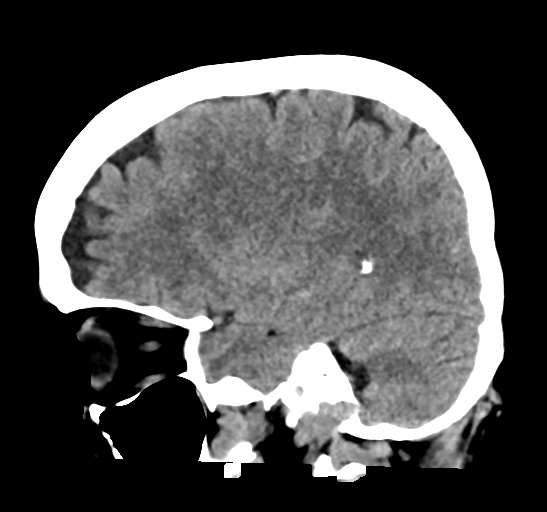
[im 31/62  brain]
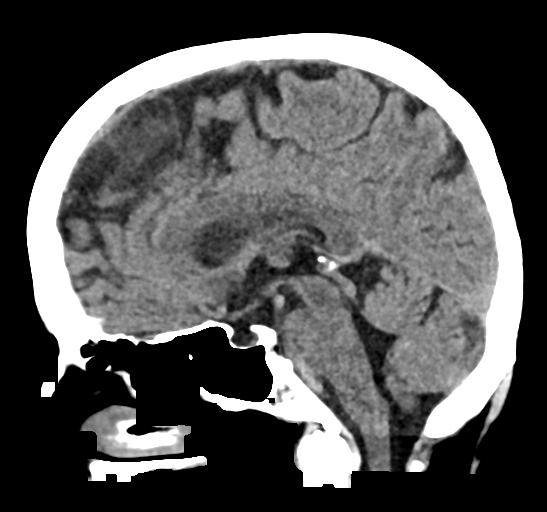
[im 41/62  brain]
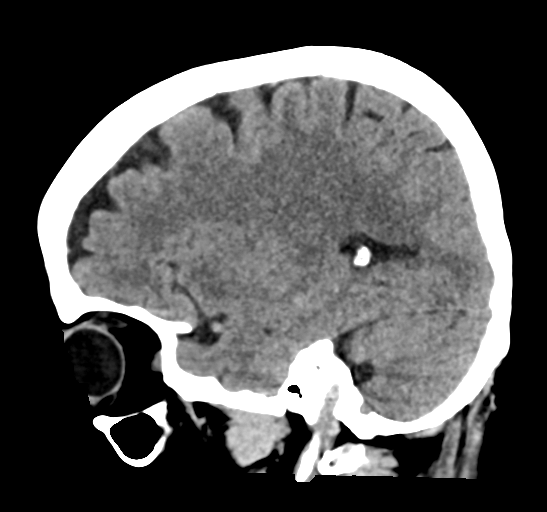

[17 of 47 positions shown; findings below may reference images not displayed]

FINDINGS: Brain: No evidence of acute infarction, hemorrhage, hydrocephalus,
extra-axial collection or mass lesion/mass effect. Chronic bilateral
basal ganglia lacunar infarcts identified. There is mild diffuse
low-attenuation within the subcortical and periventricular white
matter compatible with chronic microvascular disease. Prominence of
the sulci identified overlying the cerebral hemispheres.

Vascular: No hyperdense vessel or unexpected calcification.

Skull: Normal. Negative for fracture or focal lesion.

Sinuses/Orbits: The paranasal sinuses and mastoid air cells are
clear.

Other: None
IMPRESSION: 1. No acute intracranial abnormalities.
2. Chronic bilateral basal ganglia lacunar infarcts.
3. Chronic small vessel ischemic disease and mild brain atrophy.

## 2021-06-29 IMAGING — MR MR MRA HEAD W/O CM
2 series · 17 of 48 positions shown · IV contrast (gadavist)
Comparison: Head CT [VW] hours today.

CLINICAL DATA: 65-year-old female TIA.

EXAM:
MRI HEAD WITHOUT CONTRAST
MRA HEAD WITHOUT CONTRAST
MRA NECK WITHOUT AND WITH CONTRAST
TECHNIQUE: Multiplanar, multiecho pulse sequences of the brain and surrounding
structures were obtained without contrast. Angiographic images of
the Circle of Willis were obtained using MRA technique without
intravenous contrast. Angiographic images of the neck were obtained
using MRA technique without and with intravenous contrast. Carotid
stenosis measurements (when applicable) are obtained utilizing
NASCET criteria, using the distal internal carotid diameter as the
denominator.
CONTRAST:  7 mL Gadavist

[Series 3: ax (id) · axial · 1.0mm · 0.43mm/px · z∈[-48,+43]mm · 14 of 197 slices shown]
[im 1/197]
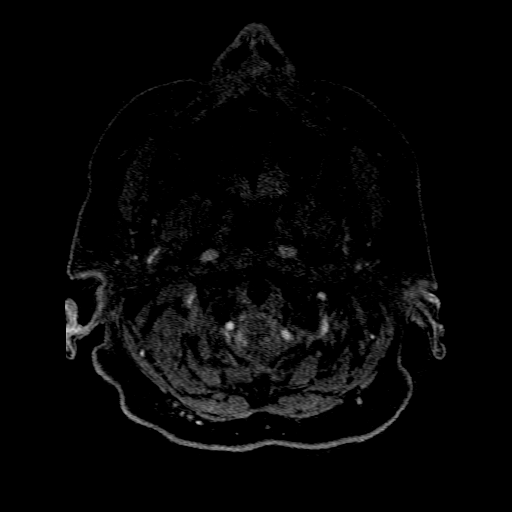
[im 5/197]
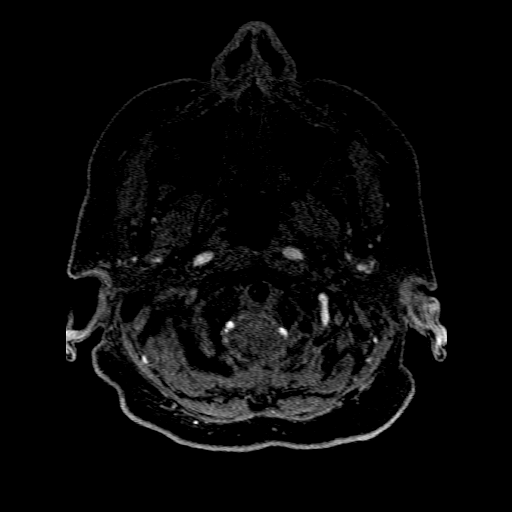
[im 9/197]
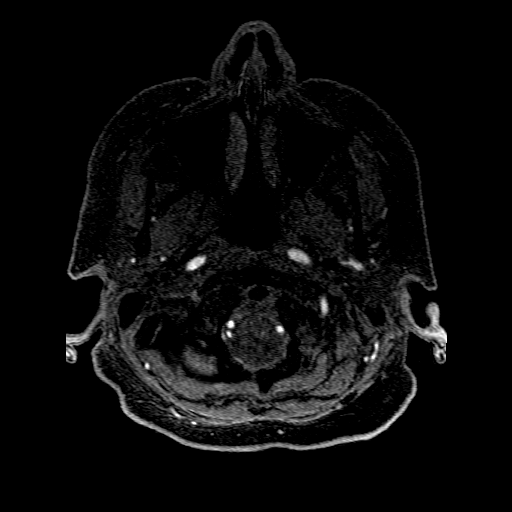
[im 14/197]
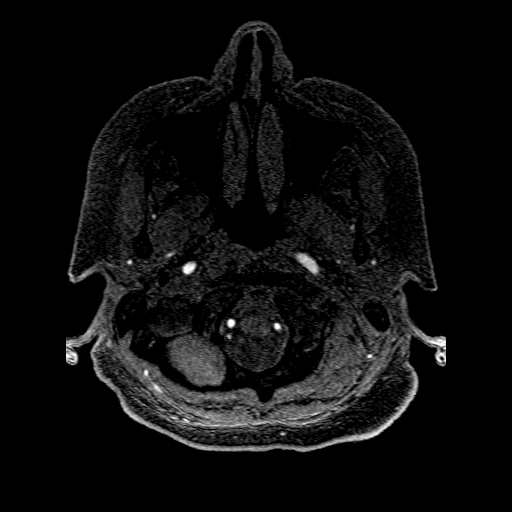
[im 32/197]
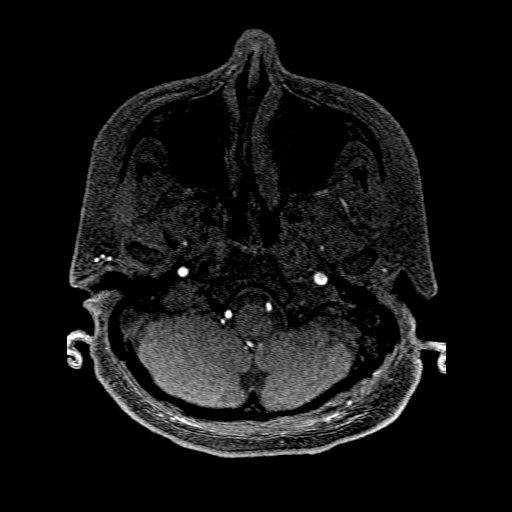
[im 36/197]
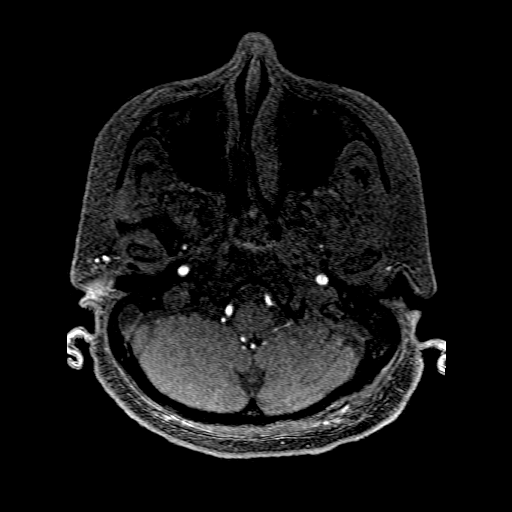
[im 63/197]
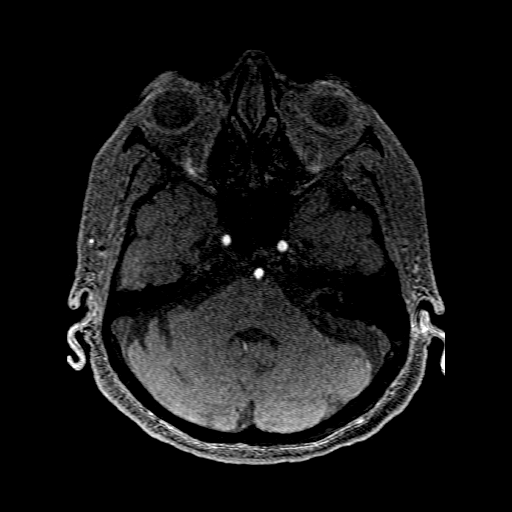
[im 85/197]
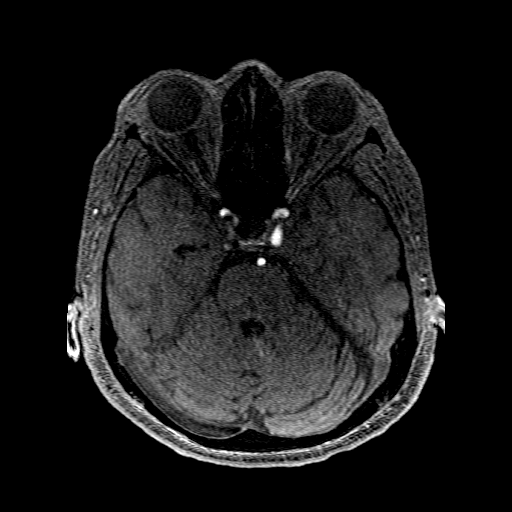
[im 98/197]
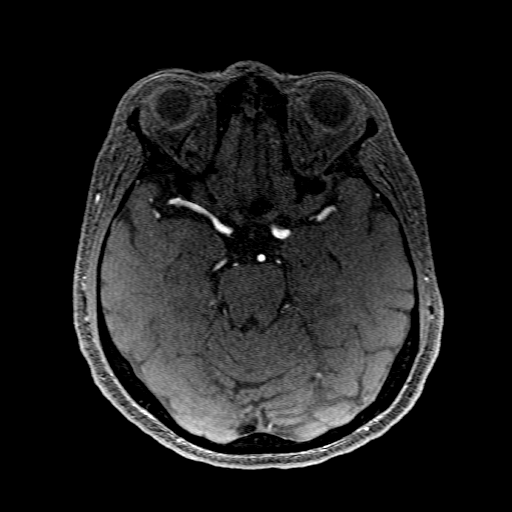
[im 112/197]
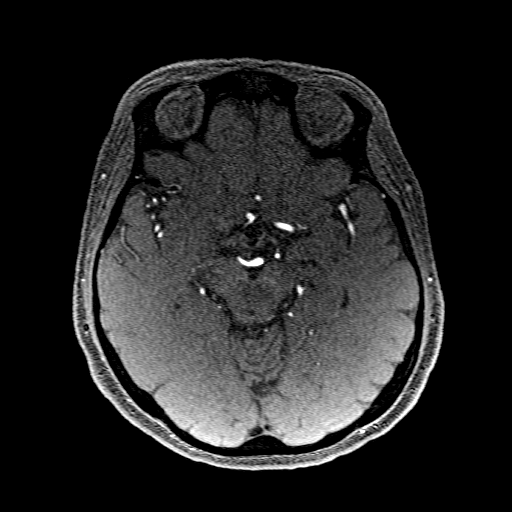
[im 134/197]
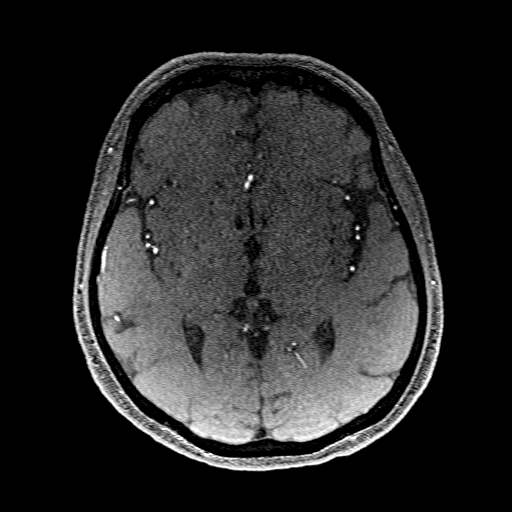
[im 161/197]
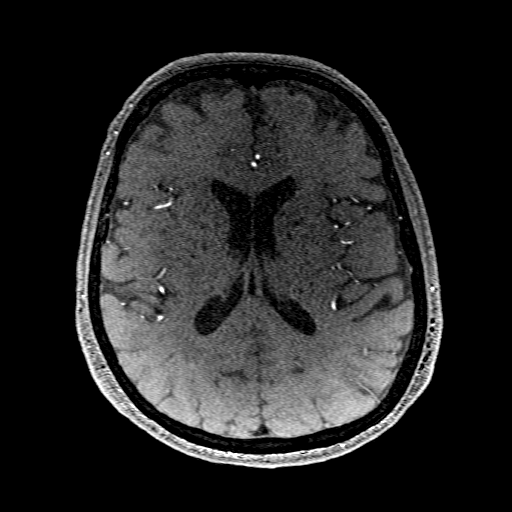
[im 165/197]
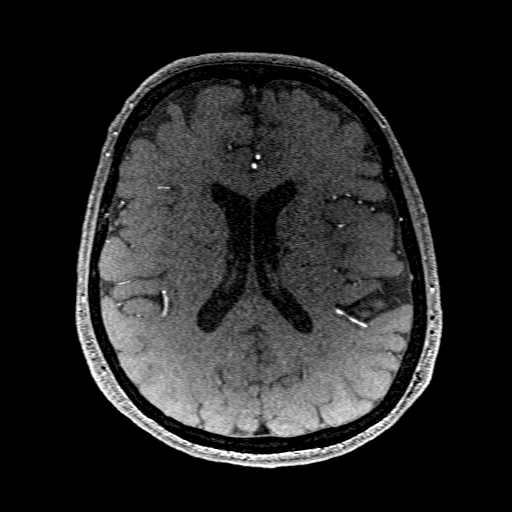
[im 188/197]
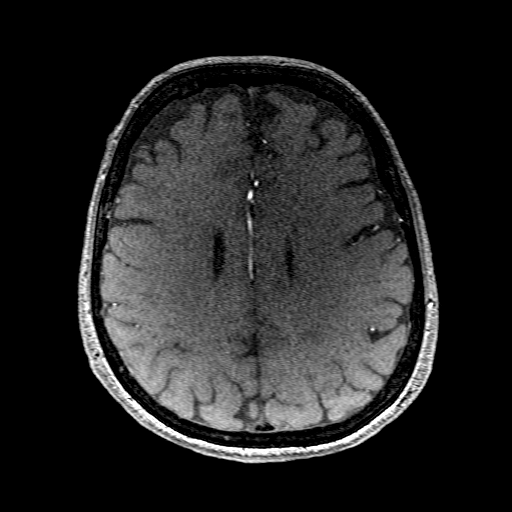

[Series 301: pjn:ax (id) · sagittal · 1.0mm · 0.43mm/px · 3 of 15 slices shown]
[im 1/15]
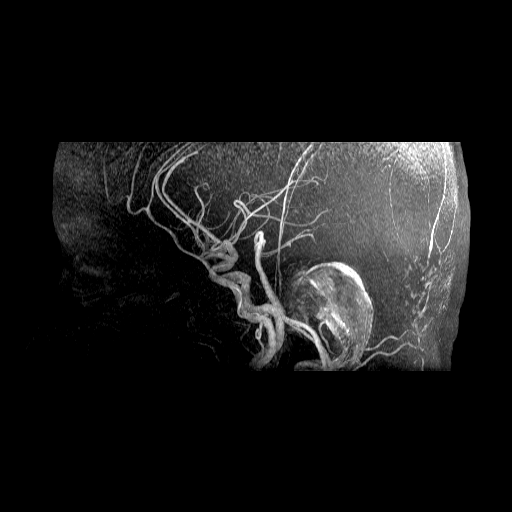
[im 8/15]
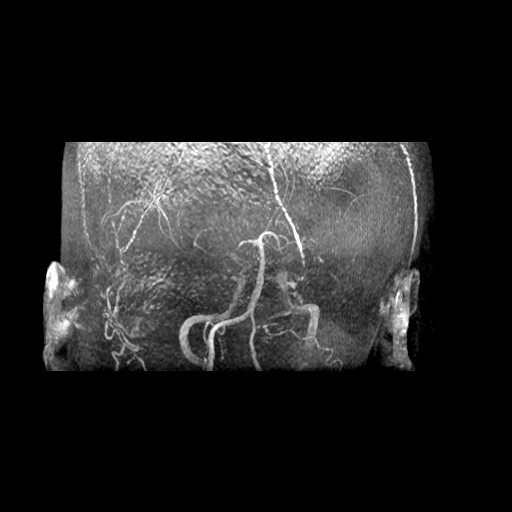
[im 15/15]
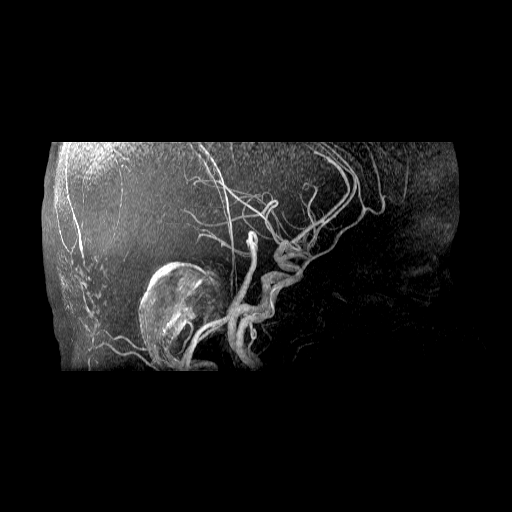

[17 of 48 positions shown; findings below may reference images not displayed]

FINDINGS: MRI HEAD FINDINGS

Brain: No restricted diffusion to suggest acute infarction. No
midline shift, mass effect, evidence of mass lesion,
ventriculomegaly, extra-axial collection or acute intracranial
hemorrhage. Cervicomedullary junction and pituitary are within
normal limits.

Chronic lacunar infarcts scattered in the bilateral corona radiata
and basal ganglia. Mild to moderate T2 heterogeneity in the
bilateral thalami and pons. No chronic cerebral blood products.
Moderate for age additional scattered bilateral cerebral white
matter T2 and FLAIR hyperintensity. No cortical encephalomalacia.
Negative cerebellum.

Vascular:  Major intracranial vascular flow voids are preserved.

Skull and upper cervical spine: Negative for age visible cervical
spine. Visualized bone marrow signal is within normal limits.

Sinuses/Orbits: Postoperative changes to both globes. Paranasal
sinuses and mastoids are stable and well aerated.

Other: Visible internal auditory structures appear normal. Negative
visible scalp and face.

MRA NECK FINDINGS

Pre contrast time-of-flight images demonstrate antegrade flow in the
bilateral cervical carotid and vertebral arteries. Carotid
bifurcations are patent. The right vertebral artery appears somewhat
dominant throughout.

Post-contrast neck MRA images suggest a bovine type arch
configuration. Patent proximal great vessels.

Mildly tortuous proximal right CCA. Patent right carotid bifurcation
without stenosis. Negative cervical right ICA.

Mildly tortuous proximal left CCA without stenosis. Patent left
carotid bifurcation. Minimal irregularity at the left ICA origin. No
cervical left ICA stenosis.

Patent proximal subclavian arteries and vertebral artery origins.
Both vertebral arteries are patent to the skull base with mild
tortuosity. The right side is dominant. No vertebral artery stenosis
identified.

MRA HEAD FINDINGS

Antegrade flow in the posterior circulation with dominant right V4
segment. Patent PICA origins and no distal vertebral or
vertebrobasilar junction stenosis. Patent basilar artery without
stenosis. Patent SCA and PCA origins. Posterior communicating
arteries are diminutive or absent. Bilateral PCA branches are within
normal limits.

Antegrade flow in both ICA siphons. Mildly dominant left siphon with
patent carotid termini and no siphon stenosis. Dominant left and
diminutive right ACA A1 segments. Normal anterior communicating
artery. Visible ACA branches are within normal limits. MCA M1
segments are patent without stenosis. Mild tortuosity on the left.
Patent MCA bi/trifurcations without stenosis. Visible MCA branches
are within normal limits.
IMPRESSION: 1. No acute intracranial abnormality. Moderate for age chronic small
vessel disease.

2. Head and Neck MRA with only evidence of only mild atherosclerosis
and no arterial stenosis.

## 2021-06-29 IMAGING — MR MR HEAD W/O CM
6 of 11 series · 28 of 48 positions shown · IV contrast (gadavist)
Comparison: Head CT [VW] hours today.

CLINICAL DATA: 65-year-old female TIA.

EXAM:
MRI HEAD WITHOUT CONTRAST
MRA HEAD WITHOUT CONTRAST
MRA NECK WITHOUT AND WITH CONTRAST
TECHNIQUE: Multiplanar, multiecho pulse sequences of the brain and surrounding
structures were obtained without contrast. Angiographic images of
the Circle of Willis were obtained using MRA technique without
intravenous contrast. Angiographic images of the neck were obtained
using MRA technique without and with intravenous contrast. Carotid
stenosis measurements (when applicable) are obtained utilizing
NASCET criteria, using the distal internal carotid diameter as the
denominator.
CONTRAST:  7 mL Gadavist

[Series 2: DWI · axial · 3.0mm · 0.94mm/px · z∈[-55,+87]mm · 9 of 100 slices shown (1 of 2)]
[im 1/100]
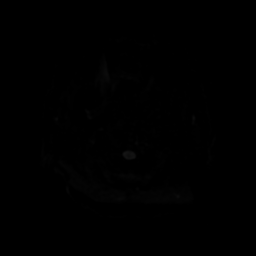
[im 13/100]
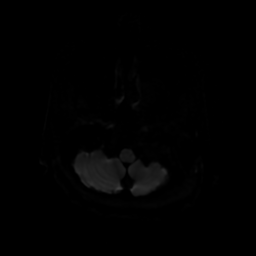
[im 25/100]
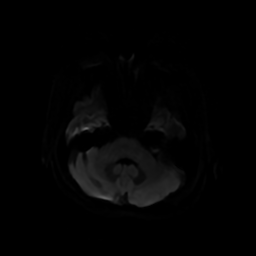
[im 38/100]
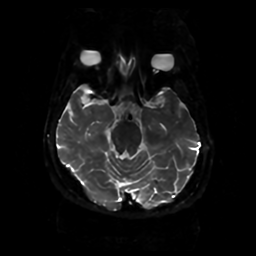
[im 50/100]
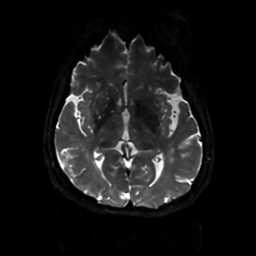
[im 62/100]
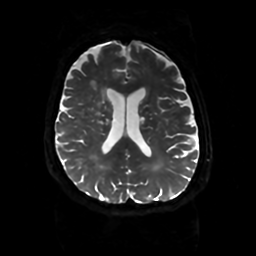
[im 75/100]
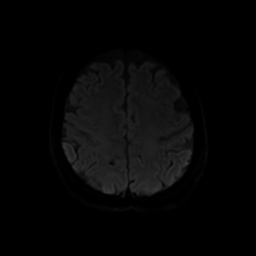
[im 87/100]
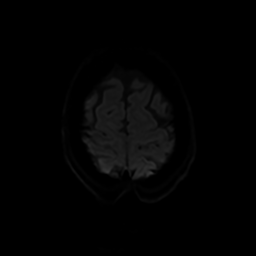
[im 100/100]
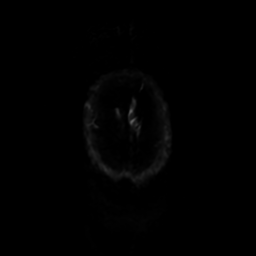

[Series 4: DWI · coronal · 4.0mm · 0.94mm/px · 6 of 68 slices shown (2 of 2)]
[im 1/68]
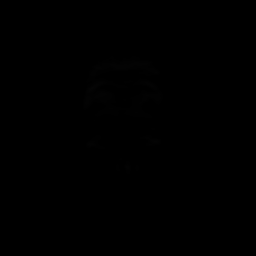
[im 14/68]
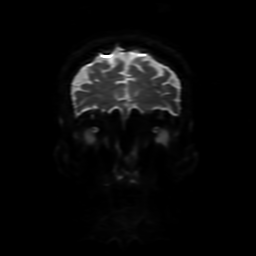
[im 27/68]
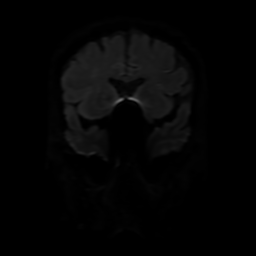
[im 41/68]
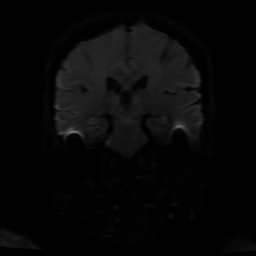
[im 54/68]
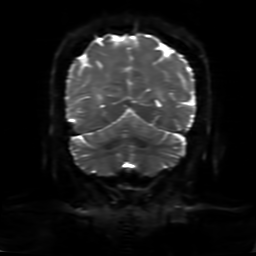
[im 68/68]
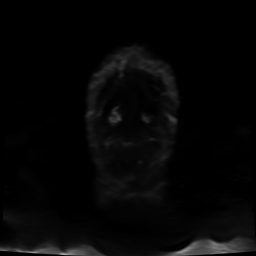

[Series 5: FLAIR · sagittal · 5.0mm · 0.23mm/px · 2 of 25 slices shown (1 of 2)]
[im 1/25]
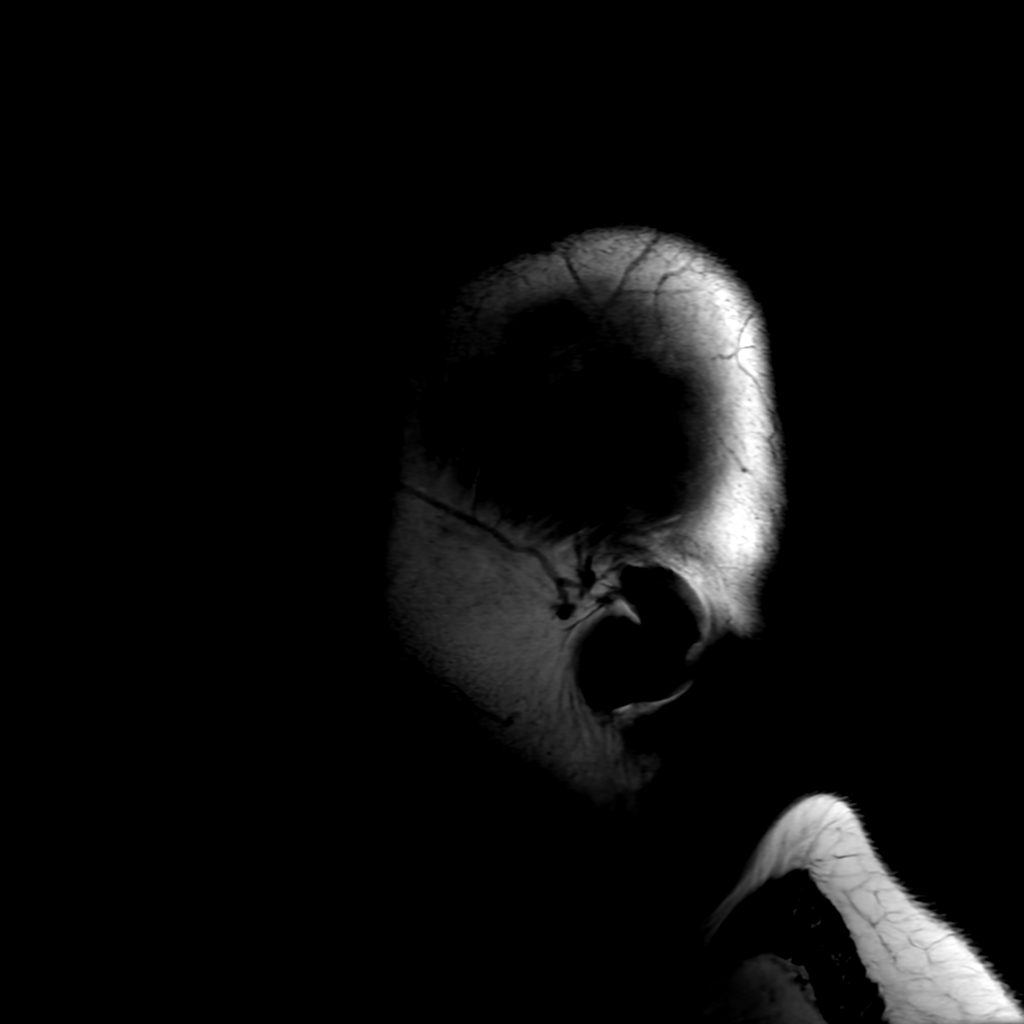
[im 25/25]
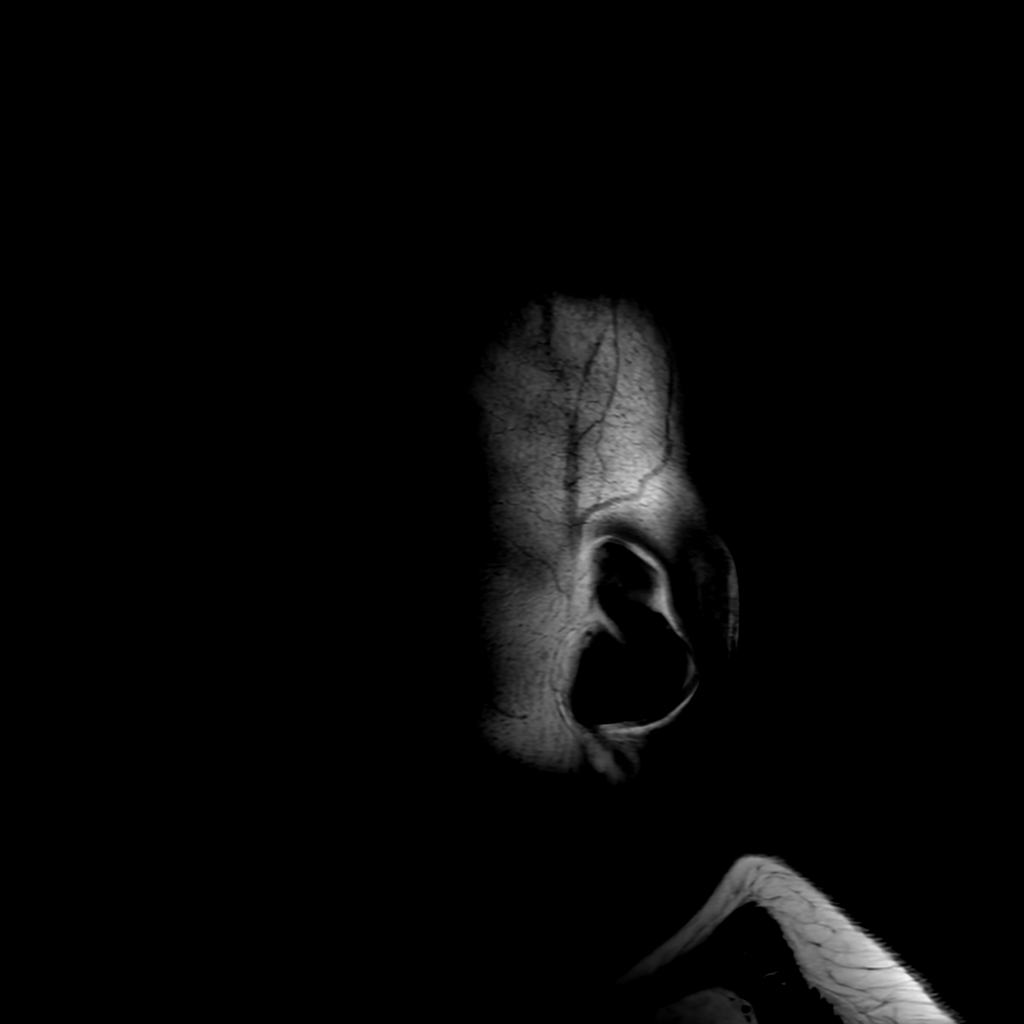

[Series 7: FLAIR · axial · 4.0mm · 0.45mm/px · z∈[-53,+93]mm · 3 of 35 slices shown (2 of 2)]
[im 1/35]
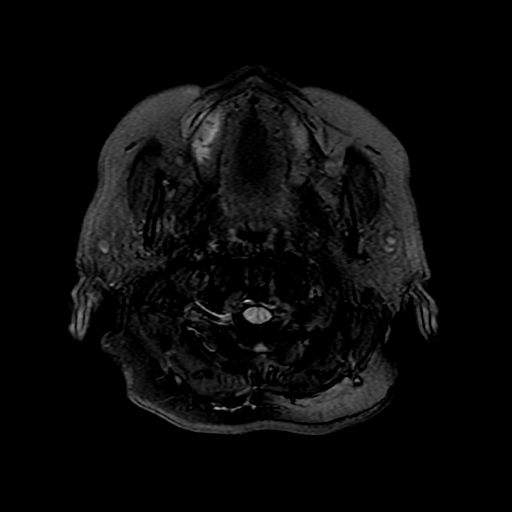
[im 18/35]
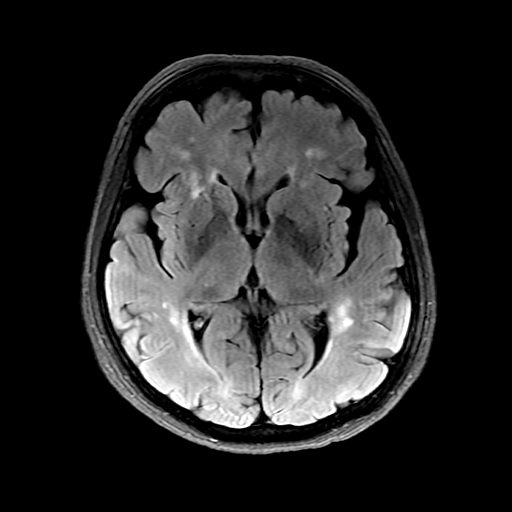
[im 35/35]
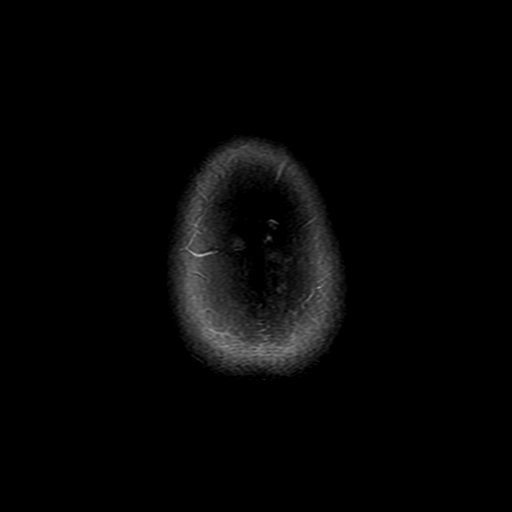

[Series 250: ADC · axial · 3.0mm · 0.94mm/px · z∈[-55,+87]mm · 5 of 50 slices shown (1 of 2)]
[im 1/50]
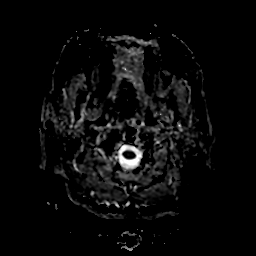
[im 13/50]
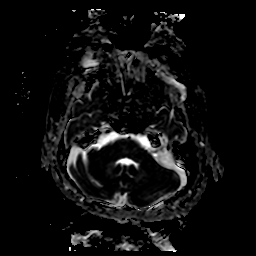
[im 25/50]
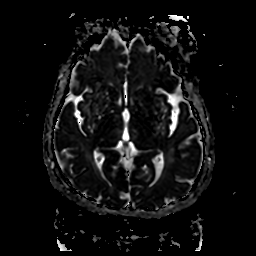
[im 37/50]
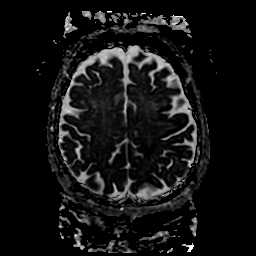
[im 50/50]
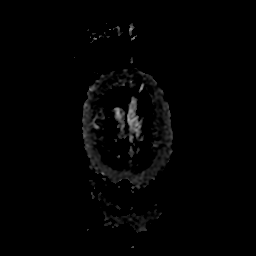

[Series 450: ADC · coronal · 4.0mm · 0.94mm/px · 3 of 34 slices shown (2 of 2)]
[im 1/34]
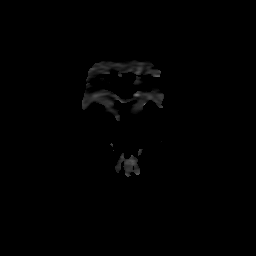
[im 17/34]
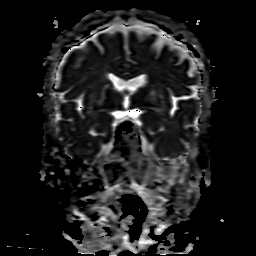
[im 34/34]
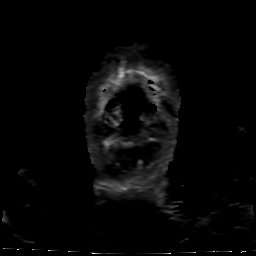

[28 of 48 positions shown; findings below may reference images not displayed]

FINDINGS: MRI HEAD FINDINGS

Brain: No restricted diffusion to suggest acute infarction. No
midline shift, mass effect, evidence of mass lesion,
ventriculomegaly, extra-axial collection or acute intracranial
hemorrhage. Cervicomedullary junction and pituitary are within
normal limits.

Chronic lacunar infarcts scattered in the bilateral corona radiata
and basal ganglia. Mild to moderate T2 heterogeneity in the
bilateral thalami and pons. No chronic cerebral blood products.
Moderate for age additional scattered bilateral cerebral white
matter T2 and FLAIR hyperintensity. No cortical encephalomalacia.
Negative cerebellum.

Vascular:  Major intracranial vascular flow voids are preserved.

Skull and upper cervical spine: Negative for age visible cervical
spine. Visualized bone marrow signal is within normal limits.

Sinuses/Orbits: Postoperative changes to both globes. Paranasal
sinuses and mastoids are stable and well aerated.

Other: Visible internal auditory structures appear normal. Negative
visible scalp and face.

MRA NECK FINDINGS

Pre contrast time-of-flight images demonstrate antegrade flow in the
bilateral cervical carotid and vertebral arteries. Carotid
bifurcations are patent. The right vertebral artery appears somewhat
dominant throughout.

Post-contrast neck MRA images suggest a bovine type arch
configuration. Patent proximal great vessels.

Mildly tortuous proximal right CCA. Patent right carotid bifurcation
without stenosis. Negative cervical right ICA.

Mildly tortuous proximal left CCA without stenosis. Patent left
carotid bifurcation. Minimal irregularity at the left ICA origin. No
cervical left ICA stenosis.

Patent proximal subclavian arteries and vertebral artery origins.
Both vertebral arteries are patent to the skull base with mild
tortuosity. The right side is dominant. No vertebral artery stenosis
identified.

MRA HEAD FINDINGS

Antegrade flow in the posterior circulation with dominant right V4
segment. Patent PICA origins and no distal vertebral or
vertebrobasilar junction stenosis. Patent basilar artery without
stenosis. Patent SCA and PCA origins. Posterior communicating
arteries are diminutive or absent. Bilateral PCA branches are within
normal limits.

Antegrade flow in both ICA siphons. Mildly dominant left siphon with
patent carotid termini and no siphon stenosis. Dominant left and
diminutive right ACA A1 segments. Normal anterior communicating
artery. Visible ACA branches are within normal limits. MCA M1
segments are patent without stenosis. Mild tortuosity on the left.
Patent MCA bi/trifurcations without stenosis. Visible MCA branches
are within normal limits.
IMPRESSION: 1. No acute intracranial abnormality. Moderate for age chronic small
vessel disease.

2. Head and Neck MRA with only evidence of only mild atherosclerosis
and no arterial stenosis.

## 2021-06-29 MED ORDER — GADOBUTROL 1 MMOL/ML IV SOLN
6.6000 mL | Freq: Once | INTRAVENOUS | Status: AC | PRN
  Administered 2021-06-29: 6.6 mL via INTRAVENOUS

## 2021-06-29 NOTE — ED Triage Notes (Signed)
Pt arrives c/o stroke like symptoms. Per pt she was awoken by daughter for unknown reasons and upon waking she had increased confusion, R sided facial droop, and slurred speech. Last known well 2100 last night. Stroke exam negative during triage. Rob B. PA at bedside for triage.  ?

## 2021-06-29 NOTE — ED Notes (Signed)
Attempted PIV x 3 and unsuccessful ?

## 2021-06-29 NOTE — Discharge Instructions (Signed)
Your history, exam, work-up today are suggestive of TIA causing the episode early this morning.  Straight stability for over 10 hours with no other neurologic deficits and the MRI is reassuring showing no acute stroke, previous team feels you are safe for discharge home.  Please call and follow-up with your neurology team as well as your primary doctor.  Please rest and stay hydrated.  If any symptoms change or worsen acutely, please return to the nearest emergency department ?

## 2021-06-29 NOTE — ED Notes (Signed)
Pt alert, oriented x 4, NAD. denies complaints at this time.  ?

## 2021-06-29 NOTE — ED Notes (Signed)
Holly Henry daughter [phone] is going to OB to be eval and would like a call with updates ?

## 2021-06-29 NOTE — ED Provider Triage Note (Addendum)
?  Emergency Medicine Provider Triage Evaluation Note ? ?MRN:  [id number]  ?Arrival date & time: 06/29/21    ?Medically screening exam initiated at 3:50 AM.   ?CC:   ?Transient Ischemic Attack ?  ?HPI:  ?Holly Henry is a 66 y.o. year-old female presents to the ED with chief complaint of slurred speech and R sided facial droop.  Daughter noticed symptoms PTA.  LKW 2100 last night.  Symptoms have completely resolved now.  Per daughter, she had been a bit confused as well.  Patient reports being constipated. ? ?History provided by History provided by patient. ?ROS:  ?-As included in HPI ?PE:  ? ?Vitals:  ? 06/29/21 0344  ?BP: 130/87  ?Pulse: 84  ?Resp: 20  ?Temp: 98.6 ?F (37 ?C)  ?SpO2: 96%  ?  ?Non-toxic appearing ?No respiratory distress ?No facial droop, clear speech, moving all extremities, CN 3-12 grossly intact. ?MDM:  ?Based on signs and symptoms, TIA is highest on my differential. ?I've ordered labs and imaging in triage to expedite lab/diagnostic workup. ? ?LKW 2100 last night.  Currently asymptomatic.  Workup ordered, but CODE STROKE NOT activated due to resolution of symptoms. ? ?Patient was informed that the remainder of the evaluation will be completed by another provider, this initial triage assessment does not replace that evaluation, and the importance of remaining in the ED until their evaluation is complete. ? ?  ?Montine Circle, PA-C ?06/29/21 0350 ? ?  ?Montine Circle, PA-C ?06/29/21 [id number] ? ?

## 2021-06-29 NOTE — ED Notes (Signed)
Provider at bedside

## 2021-06-29 NOTE — ED Provider Notes (Signed)
?[place] ?Provider Note ? ? ?CSN: [id number] ?Arrival date & time: 06/29/21  [id number] ? ?  ? ?History ? ?Chief Complaint  ?Patient presents with  ? Transient Ischemic Attack  ? ? ?Holly Henry is a 66 y.o. female. ? ?The history is provided by the patient and medical records.  ?Holly Henry is a 66 y.o. female who presents to the Emergency Department complaining of stroke sxs.  She presents to the ED accompanied by her daughter for stroke like symptoms.  Her daughter went to wake her up and had difficulty waking her, seemed to be slurring her words.  Had right sided facial droop.  At that time she complained of not feeling right, seemed confused.  LKW 2100.   ? ?A few days ago started methotexate injection (switched from po to sq) and switched from tramadol to morphine a week ago.  Has constipation.  Has fatigue, constipation since Wednesday.  Vomited once on Wednesday.   ? ?At time of ED assessment pt feels at baseline.   ? ?No fever, abdominal pain, dysuria.   ? ?Has a hx/o HTN, afib on eliquis, RA.   ?  ? ?Home Medications ?Prior to Admission medications   ?Medication Sig Start Date End Date Taking? Authorizing Provider  ?acetaminophen (TYLENOL) 500 MG tablet Take 1,000 mg by mouth every 8 (eight) hours as needed for mild pain or headache.   Yes [provider]  ?albuterol (PROVENTIL) (2.5 MG/3ML) 0.083% nebulizer solution Take 2.5 mg by nebulization every 6 (six) hours as needed for wheezing or shortness of breath (allergies).   Yes [provider]  ?albuterol (VENTOLIN HFA) 108 (90 Base) MCG/ACT inhaler Inhale 2 puffs into the lungs every 6 (six) hours as needed for wheezing or shortness of breath (allergies). 04/25/20  Yes [provider]  ?apixaban (ELIQUIS) 5 MG TABS tablet Take 5 mg by mouth 2 (two) times daily.   Yes [provider]  ?atorvastatin (LIPITOR) 80 MG tablet Take 80 mg by mouth in the morning. 07/13/20  Yes [provider]  ?baclofen (LIORESAL) 10 MG tablet Take 10 mg by mouth at bedtime as needed for muscle spasms. 07/20/20  Yes [provider]  ?busPIRone (BUSPAR) 30 MG tablet Take 30 mg by mouth 2 (two) times daily. 09/18/20  Yes [provider]  ?diclofenac Sodium (VOLTAREN) 1 % GEL Apply 1 application topically 2 (two) times daily as needed (arthritis pain).   Yes [provider]  ?ezetimibe (ZETIA) 10 MG tablet Take 10 mg by mouth in the morning. 07/28/20  Yes [provider]  ?flecainide (TAMBOCOR) 50 MG tablet Take 50 mg by mouth 2 (two) times daily.   Yes [provider]  ?folic acid (FOLVITE) 1 MG tablet Take 1 mg by mouth at bedtime. 09/27/20  Yes [provider]  ?furosemide (LASIX) 20 MG tablet Take 1 tablet (20 mg total) by mouth daily. ?Patient taking differently: Take 20 mg by mouth daily as needed for fluid or edema (feet swelling). 06/21/20  Yes Horton, Barbette Hair, MD  ?hydrochlorothiazide (HYDRODIURIL) 25 MG tablet Take 25 mg by mouth in the morning. 07/13/20  Yes [provider]  ?hydroxychloroquine (PLAQUENIL) 200 MG tablet Take 200 mg by mouth daily.   Yes [provider]  ?hydrOXYzine (ATARAX) 50 MG tablet Take 50 mg by mouth at bedtime.   Yes [provider]  ?latanoprost (XALATAN) 0.005 % ophthalmic solution Place 1 drop into both eyes at bedtime. 08/10/20  Yes [provider]  ?loratadine (CLARITIN) 10 MG tablet Take 10 mg by mouth every morning. 05/02/20  Yes [provider]  ?methotrexate 250 MG/10ML injection Inject 15 mg into the skin every Wednesday. 06/19/21  Yes [provider]  ?metoprolol tartrate (LOPRESSOR) 25 MG tablet Take 25 mg by mouth 2 (two) times daily. 09/12/20  Yes [provider]  ?montelukast (SINGULAIR) 10 MG tablet Take 10 mg by mouth daily. 07/17/20  Yes [provider]  ?morphine (MS CONTIN) 15 MG 12 hr tablet Take 15 mg by mouth every 12 (twelve) hours.   Yes  [provider]  ?Multiple Vitamin (MULTIVITAMIN WITH MINERALS) TABS tablet Take 1 tablet by mouth daily.   Yes [provider]  ?naproxen sodium (ALEVE) 220 MG tablet Take 440 mg by mouth daily as needed (pain).   Yes [provider]  ?nystatin-triamcinolone ointment (MYCOLOG) Apply 1 application topically See admin instructions. Apply topically to perineal area up to three times daily as needed for rash/itching. 02/20/20  Yes [provider]  ?oxyCODONE (OXY IR/ROXICODONE) 5 MG immediate release tablet Take 5 mg by mouth 2 (two) times daily as needed for breakthrough pain. 09/21/20  Yes [provider]  ?pantoprazole (PROTONIX) 40 MG tablet Take 40 mg by mouth every morning. 09/10/20  Yes [provider]  ?pregabalin (LYRICA) 25 MG capsule Take 25 mg by mouth 2 (two) times daily.   Yes [provider]  ?COVID-19 mRNA bivalent vaccine, Pfizer, (PFIZER COVID-19 VAC BIVALENT) injection Inject into the muscle. ?Patient not taking: Reported on 06/29/2021 02/28/21   Judyann Munson, MD  ?gabapentin (NEURONTIN) 600 MG tablet Take 600 mg by mouth 3 (three) times daily. ?Patient not taking: Reported on 06/29/2021 09/22/20   [provider]  ?influenza vaccine adjuvanted (FLUAD) 0.5 ML injection Inject into the muscle. ?Patient not taking: Reported on 06/29/2021 02/28/21   Judyann Munson, MD  ?methotrexate (RHEUMATREX) 2.5 MG tablet Take 15 mg by mouth every Thursday. ?Patient not taking: Reported on 06/29/2021 05/28/20   [provider]  ?traMADol (ULTRAM-ER) 300 MG 24 hr tablet Take 300 mg by mouth every morning. ?Patient not taking: Reported on 06/29/2021 09/12/20   [provider]  ?Zoster Vaccine Adjuvanted [facility]) injection Inject into the muscle. ?Patient not taking: Reported on 06/29/2021 02/28/21   Judyann Munson, MD  ?   ? ?Allergies    ?Patient has no known allergies.   ? ?Review of Systems   ?Review of Systems  ?All other systems  reviewed and are negative. ? ?Physical Exam ?Updated Vital Signs ?BP 133/75   Pulse 68   Temp 98.6 ?F (37 ?C) (Oral)   Resp 17   Ht 5\' 1"  (1.549 m)   Wt 74.8 kg   SpO2 96%   BMI 31.18 kg/m?  ?Physical Exam ?Vitals and nursing note reviewed.  ?Constitutional:   ?   Appearance: She is well-developed.  ?HENT:  ?   Head: Normocephalic and atraumatic.  ?Cardiovascular:  ?   Rate and Rhythm: Normal rate and regular rhythm.  ?   Heart sounds: No murmur heard. ?Pulmonary:  ?   Effort: Pulmonary effort is normal. No respiratory distress.  ?   Breath sounds: Normal breath sounds.  ?Abdominal:  ?   Palpations: Abdomen is soft.  ?   Tenderness: There is no abdominal tenderness. There is no guarding or rebound.  ?Musculoskeletal:     ?   General: No tenderness.  ?Skin: ?   General: Skin is  warm and dry.  ?Neurological:  ?   Mental Status: She is alert and oriented to person, place, and time.  ?   Comments: No asymmetry of facial movement, visual fields grossly intact.  5/5 strength in all four extremities with sensation to light touch intact in all four extremities.    ?Psychiatric:     ?   Behavior: Behavior normal.  ? ? ?ED Results / Procedures / Treatments   ?Labs ?(all labs ordered are listed, but only abnormal results are displayed) ?Labs Reviewed  ?COMPREHENSIVE METABOLIC PANEL - Abnormal; Notable for the following components:  ?    Result Value  ? Glucose, Bld 139 (*)   ? AST 14 (*)   ? Total Bilirubin 2.4 (*)   ? All other components within normal limits  ?RAPID URINE DRUG SCREEN, HOSP PERFORMED - Abnormal; Notable for the following components:  ? Opiates POSITIVE (*)   ? All other components within normal limits  ?URINALYSIS, ROUTINE W REFLEX MICROSCOPIC - Abnormal; Notable for the following components:  ? Ketones, ur 5 (*)   ? Leukocytes,Ua LARGE (*)   ? Bacteria, UA FEW (*)   ? All other components within normal limits  ?I-STAT CHEM 8, ED - Abnormal; Notable for the following components:  ? Glucose, Bld 134 (*)    ? Calcium, Ion 1.12 (*)   ? All other components within normal limits  ?RESP PANEL BY RT-PCR (FLU A&B, COVID) ARPGX2  ?ETHANOL  ?PROTIME-INR  ?APTT  ?CBC  ?DIFFERENTIAL  ? ? ?EKG ?None ? ?Radiology ?CT HEAD WO CONTRA

## 2021-06-29 NOTE — ED Provider Notes (Signed)
Care assumed from Dr. Madilyn Hook.  At time of transfer of care, patient is waiting for MRI to rule out stroke as the patient reportedly had facial droop and slurred speech when she was difficult to arouse this morning.  She was last well at 9 PM last night.  Previous team suspected TIA and if MRI is reassuring, anticipate discharge home for outpatient follow-up. ? ?Patient has proven stability for over 10 hours now.  MRI just returned showing no evidence of acute stroke.  As she is feeling well and has reassuring work-up, patient will be discharged home to follow-up with outpatient neurology team for likely TIA. ? ?Clinical Impression: ?1. TIA (transient ischemic attack)   ? ? ?Disposition: Discharge ? ?Condition: Good ? ?I have discussed the results, Dx and Tx plan with the pt(& family if present). He/she/they expressed understanding and agree(s) with the plan. Discharge instructions discussed at great length. Strict return precautions discussed and pt &/or family have verbalized understanding of the instructions. No further questions at time of discharge.  ? ? ?New Prescriptions  ? No medications on file  ? ? ?Follow Up: ?your neurology team ? ? ? ? ?Kerin Salen, PA-C ?[phone] ?[place] [address] ?[phone] ? ? ? ? ?[facility] EMERGENCY DEPARTMENT ?[address] ?[id number] mc ?[place] [address] ?[phone] ? ? ? ? ? ? ?  ?Samentha Perham, Canary Brim, MD ?06/29/21 1347 ? ?

## 2021-06-29 NOTE — ED Notes (Signed)
Ice water provided, okayed by Dr. Madilyn Hook.  ?

## 2021-06-29 NOTE — ED Notes (Signed)
Pt transported to radiology at this time °

## 2021-06-29 NOTE — ED Notes (Signed)
Pt returned from radiology at this time.  

## 2022-02-22 ENCOUNTER — Emergency Department (HOSPITAL_BASED_OUTPATIENT_CLINIC_OR_DEPARTMENT_OTHER)
Admission: EM | Admit: 2022-02-22 | Discharge: 2022-02-22 | Disposition: A | Discharge status: Home / Self Care | Payer: Medicare HMO | Attending: Emergency Medicine | Admitting: Emergency Medicine

## 2022-02-22 ENCOUNTER — Emergency Department (HOSPITAL_BASED_OUTPATIENT_CLINIC_OR_DEPARTMENT_OTHER): Payer: Medicare HMO

## 2022-02-22 ENCOUNTER — Encounter (HOSPITAL_BASED_OUTPATIENT_CLINIC_OR_DEPARTMENT_OTHER): Payer: Self-pay | Admitting: Emergency Medicine

## 2022-02-22 DIAGNOSIS — R059 Cough, unspecified: Secondary | ICD-10-CM | POA: Insufficient documentation

## 2022-02-22 DIAGNOSIS — R112 Nausea with vomiting, unspecified: Secondary | ICD-10-CM | POA: Diagnosis not present

## 2022-02-22 DIAGNOSIS — R410 Disorientation, unspecified: Secondary | ICD-10-CM | POA: Insufficient documentation

## 2022-02-22 DIAGNOSIS — R42 Dizziness and giddiness: Secondary | ICD-10-CM | POA: Insufficient documentation

## 2022-02-22 DIAGNOSIS — R0981 Nasal congestion: Secondary | ICD-10-CM | POA: Diagnosis not present

## 2022-02-22 DIAGNOSIS — Z20822 Contact with and (suspected) exposure to covid-19: Secondary | ICD-10-CM | POA: Insufficient documentation

## 2022-02-22 DIAGNOSIS — R519 Headache, unspecified: Secondary | ICD-10-CM | POA: Insufficient documentation

## 2022-02-22 DIAGNOSIS — Z79899 Other long term (current) drug therapy: Secondary | ICD-10-CM | POA: Diagnosis not present

## 2022-02-22 DIAGNOSIS — N390 Urinary tract infection, site not specified: Secondary | ICD-10-CM | POA: Insufficient documentation

## 2022-02-22 DIAGNOSIS — Z7901 Long term (current) use of anticoagulants: Secondary | ICD-10-CM | POA: Insufficient documentation

## 2022-02-22 DIAGNOSIS — I10 Essential (primary) hypertension: Secondary | ICD-10-CM | POA: Insufficient documentation

## 2022-02-22 DIAGNOSIS — B9689 Other specified bacterial agents as the cause of diseases classified elsewhere: Secondary | ICD-10-CM | POA: Insufficient documentation

## 2022-02-22 DIAGNOSIS — R109 Unspecified abdominal pain: Secondary | ICD-10-CM | POA: Diagnosis present

## 2022-02-22 LAB — URINALYSIS, ROUTINE W REFLEX MICROSCOPIC
Bilirubin Urine: NEGATIVE
Glucose, UA: NEGATIVE mg/dL
Hgb urine dipstick: NEGATIVE
Ketones, ur: NEGATIVE mg/dL
Leukocytes,Ua: NEGATIVE
Nitrite: POSITIVE — AB
Protein, ur: NEGATIVE mg/dL
Specific Gravity, Urine: >=1.03 (ref 1.005–1.030)
pH: 6 (ref 5.0–8.0)

## 2022-02-22 LAB — CBC WITH DIFFERENTIAL/PLATELET
Abs Immature Granulocytes: 0.02 10*3/uL (ref 0.00–0.07)
Basophils Absolute: 0.1 10*3/uL (ref 0.0–0.1)
Basophils Relative: 1 %
Eosinophils Absolute: 0.3 10*3/uL (ref 0.0–0.5)
Eosinophils Relative: 4 %
HCT: 39.8 % (ref 36.0–46.0)
Hemoglobin: 13 g/dL (ref 12.0–15.0)
Immature Granulocytes: 0 %
Lymphocytes Relative: 24 %
Lymphs Abs: 1.6 10*3/uL (ref 0.7–4.0)
MCH: 30 pg (ref 26.0–34.0)
MCHC: 32.7 g/dL (ref 30.0–36.0)
MCV: 91.7 fL (ref 80.0–100.0)
Monocytes Absolute: 0.7 10*3/uL (ref 0.1–1.0)
Monocytes Relative: 10 %
Neutro Abs: 4.3 10*3/uL (ref 1.7–7.7)
Neutrophils Relative %: 61 %
Platelets: 226 10*3/uL (ref 150–400)
RBC: 4.34 MIL/uL (ref 3.87–5.11)
RDW: 14.6 % (ref 11.5–15.5)
WBC: 7 10*3/uL (ref 4.0–10.5)
nRBC: 0 % (ref 0.0–0.2)

## 2022-02-22 LAB — COMPREHENSIVE METABOLIC PANEL
ALT: 28 U/L (ref 0–44)
AST: 14 U/L — ABNORMAL LOW (ref 15–41)
Albumin: 4.1 g/dL (ref 3.5–5.0)
Alkaline Phosphatase: 60 U/L (ref 38–126)
Anion gap: 6 (ref 5–15)
BUN: 22 mg/dL (ref 8–23)
CO2: 27 mmol/L (ref 22–32)
Calcium: 8.8 mg/dL — ABNORMAL LOW (ref 8.9–10.3)
Chloride: 108 mmol/L (ref 98–111)
Creatinine, Ser: 1.04 mg/dL — ABNORMAL HIGH (ref 0.44–1.00)
GFR, Estimated: 59 mL/min — ABNORMAL LOW (ref >60)
Glucose, Bld: 122 mg/dL — ABNORMAL HIGH (ref 70–99)
Potassium: 4 mmol/L (ref 3.5–5.1)
Sodium: 141 mmol/L (ref 135–145)
Total Bilirubin: 0.6 mg/dL (ref 0.3–1.2)
Total Protein: 7 g/dL (ref 6.5–8.1)

## 2022-02-22 LAB — TROPONIN I (HIGH SENSITIVITY)
Troponin I (High Sensitivity): 2 ng/L (ref <18)
Troponin I (High Sensitivity): 2 ng/L (ref <18)

## 2022-02-22 LAB — LACTIC ACID, PLASMA: Lactic Acid, Venous: 0.8 mmol/L (ref 0.5–1.9)

## 2022-02-22 LAB — RAPID URINE DRUG SCREEN, HOSP PERFORMED
Amphetamines: NOT DETECTED
Barbiturates: NOT DETECTED
Benzodiazepines: NOT DETECTED
Cocaine: NOT DETECTED
Opiates: NOT DETECTED
Tetrahydrocannabinol: NOT DETECTED

## 2022-02-22 LAB — RESP PANEL BY RT-PCR (FLU A&B, COVID) ARPGX2
Influenza A by PCR: NEGATIVE
Influenza B by PCR: NEGATIVE
SARS Coronavirus 2 by RT PCR: NEGATIVE

## 2022-02-22 LAB — URINALYSIS, MICROSCOPIC (REFLEX)

## 2022-02-22 LAB — AMMONIA: Ammonia: 17 umol/L (ref 9–35)

## 2022-02-22 LAB — LIPASE, BLOOD: Lipase: 37 U/L (ref 11–51)

## 2022-02-22 LAB — ETHANOL: Alcohol, Ethyl (B): <10 mg/dL (ref <10)

## 2022-02-22 MED ORDER — LORAZEPAM 2 MG/ML IJ SOLN: 0.5000 mg | Freq: Once | INTRAMUSCULAR | Status: DC

## 2022-02-22 MED ORDER — SODIUM CHLORIDE 0.9 % IV SOLN
1.0000 g | Freq: Once | INTRAVENOUS | Status: AC
  Administered 2022-02-22: 1 g via INTRAVENOUS
  Filled 2022-02-22: qty 10

## 2022-02-22 MED ORDER — ONDANSETRON 4 MG PO TBDP: 4.0000 mg | ORAL_TABLET | Freq: Three times a day (TID) | ORAL | 0 refills | Status: DC | PRN

## 2022-02-22 MED ORDER — ONDANSETRON HCL 4 MG/2ML IJ SOLN
4.0000 mg | Freq: Once | INTRAMUSCULAR | Status: AC
  Administered 2022-02-22: 4 mg via INTRAVENOUS
  Filled 2022-02-22: qty 2

## 2022-02-22 MED ORDER — LACTATED RINGERS IV BOLUS
500.0000 mL | Freq: Once | INTRAVENOUS | Status: AC
  Administered 2022-02-22: 500 mL via INTRAVENOUS

## 2022-02-22 MED ORDER — CEPHALEXIN 500 MG PO CAPS: 500.0000 mg | ORAL_CAPSULE | Freq: Three times a day (TID) | ORAL | 0 refills | Status: AC

## 2022-02-22 NOTE — ED Triage Notes (Signed)
Pta with daughter, to triage in wheelchair, and endorses confusion, (daughter states pt "acting strange") sweating, and hypertension at 1100 today. Last normal midnight last night. Daughter reports slurred speech. Pt aox4, bilaterally equal. Pt  denies HA. Pt endorses increase urination and emesis

## 2022-02-22 NOTE — ED Notes (Signed)
Discharge instructions reviewed with patient. Patient questions answered and opportunity for education reviewed. Patient voices understanding of discharge instructions with no further questions. Patient ambulatory with steady gait to lobby.  

## 2022-02-22 NOTE — ED Notes (Signed)
Pt transported to MRI 

## 2022-02-22 NOTE — ED Provider Notes (Signed)
[facility] EMERGENCY DEPARTMENT Provider Note   CSN: [id number] Arrival date & time: 02/22/22  1452     History {Add pertinent medical, surgical, social history, OB history to HPI:1} Chief Complaint  Patient presents with   Altered Mental Status    Holly Henry is a 66 y.o. female.  HPI      Since 11AM has been confused Sitting and feeling lightheaded, thinks maybe passed out a number of times Vomiting off and on since 11 a lot, vomiting clear water, threw up 4 times Diaphoresis Dropped glass of water Confused Feels more agitated today, legs bouncing, ancy 66 year old kept saying she was falling asleep, was holding 21 month old  Seemed ok last night around 9pm 38 mo old has RSV, daughter left at 9PM and she seemd fine, got home last night from hospital around 3AM and she was loud, brother in law and family in the house this AM, was throwing up and looking pale and came in  BP was 253/122 at home on monitor  Headache Speech seems a little slurred, not severe No focal numbness/weakness No change in vision, drooping of the face More lightheaded, not room spinning  Cough, runny nose, took zycam, stopped biologic for RA this week. Does note cp on ROS when at bed at night, up and down stairs has some dyspnea Had UTI 3 weeks ago, had to do second round of abx.  Had abdominal pain with UTI, not nay more. Hx of IBS,.  No BM today. Severe constipation over one week ago.     Going down on hydroxizine, added belsomra, cymbalta, reduced buspar 10/3.  Daughter thinks she has been sleepy since starting the belsomra.   Pt mother had MI/CVA hx  Fam with rsv/strep  No etoh, smoking or other drugs   Past Medical History:  Diagnosis Date   A-fib (HCC)    Fibromyalgia    High cholesterol    Hypertension    Osteoarthritis    Prediabetes    Rheumatoid arthritis (HCC)      Home Medications Prior to Admission medications   Medication Sig Start Date End Date Taking?  Authorizing Provider  acetaminophen (TYLENOL) 500 MG tablet Take 1,000 mg by mouth every 8 (eight) hours as needed for mild pain or headache.    [provider]  albuterol (PROVENTIL) (2.5 MG/3ML) 0.083% nebulizer solution Take 2.5 mg by nebulization every 6 (six) hours as needed for wheezing or shortness of breath (allergies).    [provider]  albuterol (VENTOLIN HFA) 108 (90 Base) MCG/ACT inhaler Inhale 2 puffs into the lungs every 6 (six) hours as needed for wheezing or shortness of breath (allergies). 04/25/20   [provider]  apixaban (ELIQUIS) 5 MG TABS tablet Take 5 mg by mouth 2 (two) times daily.    [provider]  atorvastatin (LIPITOR) 80 MG tablet Take 80 mg by mouth in the morning. 07/13/20   [provider]  baclofen (LIORESAL) 10 MG tablet Take 10 mg by mouth at bedtime as needed for muscle spasms. 07/20/20   [provider]  busPIRone (BUSPAR) 30 MG tablet Take 30 mg by mouth 2 (two) times daily. 09/18/20   [provider]  COVID-19 mRNA bivalent vaccine, Pfizer, (PFIZER COVID-19 VAC BIVALENT) injection Inject into the muscle. Patient not taking: Reported on 06/29/2021 02/28/21   Judyann Munson, MD  diclofenac Sodium (VOLTAREN) 1 % GEL Apply 1 application topically 2 (two) times daily as needed (arthritis pain).  [provider]  ezetimibe (ZETIA) 10 MG tablet Take 10 mg by mouth in the morning. 07/28/20   [provider]  flecainide (TAMBOCOR) 50 MG tablet Take 50 mg by mouth 2 (two) times daily.    [provider]  folic acid (FOLVITE) 1 MG tablet Take 1 mg by mouth at bedtime. 09/27/20   [provider]  furosemide (LASIX) 20 MG tablet Take 1 tablet (20 mg total) by mouth daily. Patient taking differently: Take 20 mg by mouth daily as needed for fluid or edema (feet swelling). 06/21/20   Horton, Mayer Masker, MD  gabapentin (NEURONTIN) 600 MG tablet Take 600 mg by mouth 3 (three) times  daily. Patient not taking: Reported on 06/29/2021 09/22/20   [provider]  hydrochlorothiazide (HYDRODIURIL) 25 MG tablet Take 25 mg by mouth in the morning. 07/13/20   [provider]  hydroxychloroquine (PLAQUENIL) 200 MG tablet Take 200 mg by mouth daily.    [provider]  hydrOXYzine (ATARAX) 50 MG tablet Take 50 mg by mouth at bedtime.    [provider]  influenza vaccine adjuvanted (FLUAD) 0.5 ML injection Inject into the muscle. Patient not taking: Reported on 06/29/2021 02/28/21   Judyann Munson, MD  latanoprost (XALATAN) 0.005 % ophthalmic solution Place 1 drop into both eyes at bedtime. 08/10/20   [provider]  loratadine (CLARITIN) 10 MG tablet Take 10 mg by mouth every morning. 05/02/20   [provider]  methotrexate (RHEUMATREX) 2.5 MG tablet Take 15 mg by mouth every Thursday. Patient not taking: Reported on 06/29/2021 05/28/20   [provider]  methotrexate 250 MG/10ML injection Inject 15 mg into the skin every Wednesday. 06/19/21   [provider]  metoprolol tartrate (LOPRESSOR) 25 MG tablet Take 25 mg by mouth 2 (two) times daily. 09/12/20   [provider]  montelukast (SINGULAIR) 10 MG tablet Take 10 mg by mouth daily. 07/17/20   [provider]  morphine (MS CONTIN) 15 MG 12 hr tablet Take 15 mg by mouth every 12 (twelve) hours.    [provider]  Multiple Vitamin (MULTIVITAMIN WITH MINERALS) TABS tablet Take 1 tablet by mouth daily.    [provider]  naproxen sodium (ALEVE) 220 MG tablet Take 440 mg by mouth daily as needed (pain).    [provider]  nystatin-triamcinolone ointment (MYCOLOG) Apply 1 application topically See admin instructions. Apply topically to perineal area up to three times daily as needed for rash/itching. 02/20/20   [provider]  oxyCODONE (OXY IR/ROXICODONE) 5 MG immediate release tablet Take 5 mg by mouth 2 (two) times daily  as needed for breakthrough pain. 09/21/20   [provider]  pantoprazole (PROTONIX) 40 MG tablet Take 40 mg by mouth every morning. 09/10/20   [provider]  pregabalin (LYRICA) 25 MG capsule Take 25 mg by mouth 2 (two) times daily.    [provider]  traMADol (ULTRAM-ER) 300 MG 24 hr tablet Take 300 mg by mouth every morning. Patient not taking: Reported on 06/29/2021 09/12/20   [provider]  Zoster Vaccine Adjuvanted [facility]) injection Inject into the muscle. Patient not taking: Reported on 06/29/2021 02/28/21   Judyann Munson, MD      Allergies    Patient has no known allergies.    Review of Systems   Review of Systems  Physical Exam Updated Vital Signs BP (!) 170/104 (BP Location: Right Arm)   Pulse (!) 59   Temp (!) 96.2  F (35.7 C)   Resp 17   Ht 5\' 1"  (1.549 m)   Wt 73 kg   SpO2 99%   BMI 30.42 kg/m  Physical Exam  ED Results / Procedures / Treatments   Labs (all labs ordered are listed, but only abnormal results are displayed) Labs Reviewed - No data to display  EKG EKG Interpretation  Date/Time:  Saturday February 22 2022 15:06:31 EST Ventricular Rate:  59 PR Interval:  176 QRS Duration: 104 QT Interval:  448 QTC Calculation: 444 R Axis:   40 Text Interpretation: Sinus rhythm Low voltage, precordial leads No significant change since last tracing Confirmed by [id number] (Alvira Monday) on 02/22/2022 3:12:36 PM  Radiology No results found.  Procedures Procedures  {Document cardiac monitor, telemetry assessment procedure when appropriate:1}  Medications Ordered in ED Medications - No data to display  ED Course/ Medical Decision Making/ A&P                           Medical Decision Making  ***  {Document critical care time when appropriate:1} {Document review of labs and clinical decision tools ie heart score, Chads2Vasc2 etc:1}  {Document your independent review of radiology images, and any outside  records:1} {Document your discussion with family members, caretakers, and with consultants:1} {Document social determinants of health affecting pt's care:1} {Document your decision making why or why not admission, treatments were needed:1} Final Clinical Impression(s) / ED Diagnoses Final diagnoses:  None    Rx / DC Orders ED Discharge Orders     None

## 2022-05-18 ENCOUNTER — Emergency Department (HOSPITAL_COMMUNITY): Payer: Medicare HMO

## 2022-05-18 ENCOUNTER — Other Ambulatory Visit: Payer: Self-pay

## 2022-05-18 ENCOUNTER — Emergency Department (HOSPITAL_COMMUNITY)
Admission: EM | Admit: 2022-05-18 | Discharge: 2022-05-18 | Disposition: A | Discharge status: Home / Self Care | Payer: Medicare HMO | Attending: Emergency Medicine | Admitting: Emergency Medicine

## 2022-05-18 DIAGNOSIS — Y900 Blood alcohol level of less than 20 mg/100 ml: Secondary | ICD-10-CM | POA: Diagnosis not present

## 2022-05-18 DIAGNOSIS — R4182 Altered mental status, unspecified: Secondary | ICD-10-CM | POA: Insufficient documentation

## 2022-05-18 DIAGNOSIS — R410 Disorientation, unspecified: Secondary | ICD-10-CM | POA: Diagnosis present

## 2022-05-18 DIAGNOSIS — E876 Hypokalemia: Secondary | ICD-10-CM | POA: Insufficient documentation

## 2022-05-18 DIAGNOSIS — N3000 Acute cystitis without hematuria: Secondary | ICD-10-CM

## 2022-05-18 DIAGNOSIS — Z7901 Long term (current) use of anticoagulants: Secondary | ICD-10-CM | POA: Diagnosis not present

## 2022-05-18 DIAGNOSIS — N179 Acute kidney failure, unspecified: Secondary | ICD-10-CM | POA: Insufficient documentation

## 2022-05-18 LAB — URINALYSIS, ROUTINE W REFLEX MICROSCOPIC
Bilirubin Urine: NEGATIVE
Glucose, UA: NEGATIVE mg/dL
Ketones, ur: NEGATIVE mg/dL
Nitrite: NEGATIVE
Protein, ur: NEGATIVE mg/dL
Specific Gravity, Urine: 1.009 (ref 1.005–1.030)
pH: 5 (ref 5.0–8.0)

## 2022-05-18 LAB — RAPID URINE DRUG SCREEN, HOSP PERFORMED
Amphetamines: NOT DETECTED
Barbiturates: NOT DETECTED
Benzodiazepines: NOT DETECTED
Cocaine: NOT DETECTED
Opiates: NOT DETECTED
Tetrahydrocannabinol: NOT DETECTED

## 2022-05-18 LAB — CBC WITH DIFFERENTIAL/PLATELET
Abs Immature Granulocytes: 0.03 10*3/uL (ref 0.00–0.07)
Basophils Absolute: 0.1 10*3/uL (ref 0.0–0.1)
Basophils Relative: 1 %
Eosinophils Absolute: 0.3 10*3/uL (ref 0.0–0.5)
Eosinophils Relative: 3 %
HCT: 37.4 % (ref 36.0–46.0)
Hemoglobin: 12.1 g/dL (ref 12.0–15.0)
Immature Granulocytes: 0 %
Lymphocytes Relative: 26 %
Lymphs Abs: 2.5 10*3/uL (ref 0.7–4.0)
MCH: 30.8 pg (ref 26.0–34.0)
MCHC: 32.4 g/dL (ref 30.0–36.0)
MCV: 95.2 fL (ref 80.0–100.0)
Monocytes Absolute: 1 10*3/uL (ref 0.1–1.0)
Monocytes Relative: 11 %
Neutro Abs: 5.6 10*3/uL (ref 1.7–7.7)
Neutrophils Relative %: 59 %
Platelets: 239 10*3/uL (ref 150–400)
RBC: 3.93 MIL/uL (ref 3.87–5.11)
RDW: 13.7 % (ref 11.5–15.5)
WBC: 9.5 10*3/uL (ref 4.0–10.5)
nRBC: 0 % (ref 0.0–0.2)

## 2022-05-18 LAB — COMPREHENSIVE METABOLIC PANEL
ALT: 20 U/L (ref 0–44)
AST: 12 U/L — ABNORMAL LOW (ref 15–41)
Albumin: 3.9 g/dL (ref 3.5–5.0)
Alkaline Phosphatase: 59 U/L (ref 38–126)
Anion gap: 10 (ref 5–15)
BUN: 29 mg/dL — ABNORMAL HIGH (ref 8–23)
CO2: 23 mmol/L (ref 22–32)
Calcium: 8.8 mg/dL — ABNORMAL LOW (ref 8.9–10.3)
Chloride: 107 mmol/L (ref 98–111)
Creatinine, Ser: 1.17 mg/dL — ABNORMAL HIGH (ref 0.44–1.00)
GFR, Estimated: 51 mL/min — ABNORMAL LOW (ref >60)
Glucose, Bld: 115 mg/dL — ABNORMAL HIGH (ref 70–99)
Potassium: 3.3 mmol/L — ABNORMAL LOW (ref 3.5–5.1)
Sodium: 140 mmol/L (ref 135–145)
Total Bilirubin: 1 mg/dL (ref 0.3–1.2)
Total Protein: 6.2 g/dL — ABNORMAL LOW (ref 6.5–8.1)

## 2022-05-18 LAB — ETHANOL: Alcohol, Ethyl (B): <10 mg/dL (ref <10)

## 2022-05-18 LAB — CBG MONITORING, ED: Glucose-Capillary: 143 mg/dL — ABNORMAL HIGH (ref 70–99)

## 2022-05-18 MED ORDER — SODIUM CHLORIDE 0.9 % IV BOLUS
1000.0000 mL | Freq: Once | INTRAVENOUS | Status: AC
  Administered 2022-05-18: 1000 mL via INTRAVENOUS

## 2022-05-18 MED ORDER — LORAZEPAM 1 MG PO TABS: 0.5000 mg | ORAL_TABLET | Freq: Once | ORAL | Status: DC

## 2022-05-18 MED ORDER — POTASSIUM CHLORIDE CRYS ER 20 MEQ PO TBCR
20.0000 meq | EXTENDED_RELEASE_TABLET | Freq: Once | ORAL | Status: AC
  Administered 2022-05-18: 20 meq via ORAL
  Filled 2022-05-18: qty 1

## 2022-05-18 MED ORDER — SODIUM CHLORIDE 0.9 % IV SOLN
1.0000 g | Freq: Once | INTRAVENOUS | Status: AC
  Administered 2022-05-18: 1 g via INTRAVENOUS
  Filled 2022-05-18: qty 10

## 2022-05-18 MED ORDER — CEPHALEXIN 500 MG PO CAPS: 500.0000 mg | ORAL_CAPSULE | Freq: Three times a day (TID) | ORAL | 0 refills | Status: DC

## 2022-05-18 MED ORDER — OXYCODONE-ACETAMINOPHEN 5-325 MG PO TABS: 1.0000 | ORAL_TABLET | Freq: Four times a day (QID) | ORAL | 0 refills | Status: DC | PRN

## 2022-05-18 NOTE — ED Notes (Signed)
Patient Alert and oriented to baseline. Stable and ambulatory to baseline. Patient verbalized understanding of the discharge instructions.  Patient belongings were taken by the patient.   

## 2022-05-18 NOTE — ED Notes (Signed)
Pts daughter is POA, she can assist with any questions if any, Anderson Malta [phone]

## 2022-05-18 NOTE — ED Provider Notes (Signed)
**Holly Holly** Holly Holly   CSN: [id number] Arrival date & time: 05/18/22  7672     History  Chief Complaint  Patient presents with   Altered Mental Status    Holly Holly is a 67 y.o. female with a past medical history of paroxysmal A-fib, TIA, UTI, altered mental status and fibromyalgia on chronic oxycodone presenting today due to altered mental status.  Patient's family called EMS because the patient was confused this morning.  They state that she was taking an extra long time to do tasks that she normally does.  They do Holly that there was cocaine all around her when they found her.   History limited, level 5 caveat.  She does tell me that she fell a couple days ago.  She is alert to herself, location but cannot tell me why she is here or the correct year.  Denies any drug use.  Denies any urinary symptoms.   Altered Mental Status      Home Medications Prior to Admission medications   Medication Sig Start Date End Date Taking? Authorizing Provider  acetaminophen (TYLENOL) 500 MG tablet Take 1,000 mg by mouth every 8 (eight) hours as needed for mild pain or headache.    [provider]  albuterol (PROVENTIL) (2.5 MG/3ML) 0.083% nebulizer solution Take 2.5 mg by nebulization every 6 (six) hours as needed for wheezing or shortness of breath (allergies).    [provider]  albuterol (VENTOLIN HFA) 108 (90 Base) MCG/ACT inhaler Inhale 2 puffs into the lungs every 6 (six) hours as needed for wheezing or shortness of breath (allergies). 04/25/20   [provider]  apixaban (ELIQUIS) 5 MG TABS tablet Take 5 mg by mouth 2 (two) times daily.    [provider]  atorvastatin (LIPITOR) 80 MG tablet Take 80 mg by mouth in the morning. 07/13/20   [provider]  baclofen (LIORESAL) 10 MG tablet Take 10 mg by mouth at bedtime as needed for muscle spasms. 07/20/20   [provider]  busPIRone (BUSPAR)  30 MG tablet Take 30 mg by mouth 2 (two) times daily. 09/18/20   [provider]  COVID-19 mRNA bivalent vaccine, Pfizer, (PFIZER COVID-19 VAC BIVALENT) injection Inject into the muscle. Patient not taking: Reported on 06/29/2021 02/28/21   Carlyle Basques, MD  diclofenac Sodium (VOLTAREN) 1 % GEL Apply 1 application topically 2 (two) times daily as needed (arthritis pain).    [provider]  ezetimibe (ZETIA) 10 MG tablet Take 10 mg by mouth in the morning. 07/28/20   [provider]  flecainide (TAMBOCOR) 50 MG tablet Take 50 mg by mouth 2 (two) times daily.    [provider]  folic acid (FOLVITE) 1 MG tablet Take 1 mg by mouth at bedtime. 09/27/20   [provider]  furosemide (LASIX) 20 MG tablet Take 1 tablet (20 mg total) by mouth daily. Patient taking differently: Take 20 mg by mouth daily as needed for fluid or edema (feet swelling). 06/21/20   Horton, Barbette Hair, MD  gabapentin (NEURONTIN) 600 MG tablet Take 600 mg by mouth 3 (three) times daily. Patient not taking: Reported on 06/29/2021 09/22/20   [provider]  hydrochlorothiazide (HYDRODIURIL) 25 MG tablet Take 25 mg by mouth in the morning. 07/13/20   [provider]  hydroxychloroquine (PLAQUENIL) 200 MG tablet Take 200 mg by mouth daily.    [provider]  hydrOXYzine (ATARAX) 50 MG tablet Take 50  mg by mouth at bedtime.    [provider]  influenza vaccine adjuvanted (FLUAD) 0.5 ML injection Inject into the muscle. Patient not taking: Reported on 06/29/2021 02/28/21   Carlyle Basques, MD  latanoprost (XALATAN) 0.005 % ophthalmic solution Place 1 drop into both eyes at bedtime. 08/10/20   [provider]  loratadine (CLARITIN) 10 MG tablet Take 10 mg by mouth every morning. 05/02/20   [provider]  methotrexate (RHEUMATREX) 2.5 MG tablet Take 15 mg by mouth every Thursday. Patient not taking: Reported on 06/29/2021 05/28/20   [provider]  methotrexate 250 MG/10ML injection Inject 15 mg into the skin every Wednesday. 06/19/21   [provider]  metoprolol tartrate (LOPRESSOR) 25 MG tablet Take 25 mg by mouth 2 (two) times daily. 09/12/20   [provider]  montelukast (SINGULAIR) 10 MG tablet Take 10 mg by mouth daily. 07/17/20   [provider]  morphine (MS CONTIN) 15 MG 12 hr tablet Take 15 mg by mouth every 12 (twelve) hours.    [provider]  Multiple Vitamin (MULTIVITAMIN WITH MINERALS) TABS tablet Take 1 tablet by mouth daily.    [provider]  naproxen sodium (ALEVE) 220 MG tablet Take 440 mg by mouth daily as needed (pain).    [provider]  nystatin-triamcinolone ointment (MYCOLOG) Apply 1 application topically See admin instructions. Apply topically to perineal area up to three times daily as needed for rash/itching. 02/20/20   [provider]  ondansetron (ZOFRAN-ODT) 4 MG disintegrating tablet Take 1 tablet (4 mg total) by mouth every 8 (eight) hours as needed for nausea or vomiting. 02/22/22   Gareth Morgan, MD  oxyCODONE (OXY IR/ROXICODONE) 5 MG immediate release tablet Take 5 mg by mouth 2 (two) times daily as needed for breakthrough pain. 09/21/20   [provider]  pantoprazole (PROTONIX) 40 MG tablet Take 40 mg by mouth every morning. 09/10/20   [provider]  pregabalin (LYRICA) 25 MG capsule Take 25 mg by mouth 2 (two) times daily.    [provider]  traMADol (ULTRAM-ER) 300 MG 24 hr tablet Take 300 mg by mouth every morning. Patient not taking: Reported on 06/29/2021 09/12/20   [provider]  Zoster Vaccine Adjuvanted [facility]) injection Inject into the muscle. Patient not taking: Reported on 06/29/2021 02/28/21   Carlyle Basques, MD      Allergies    Patient has no known allergies.    Review of Systems   Review of Systems  Physical Exam Updated Vital Signs BP (!) 170/86 (BP Location:  Right Arm)   Pulse 64   Temp (!) 97.2 F (36.2 C) (Oral)   Resp 14   Ht 5\' 1"  (1.549 m)   Wt 75 kg   SpO2 100%   BMI 31.24 kg/m  Physical Exam Vitals and nursing Holly reviewed.  Constitutional:      General: She is not in acute distress.    Appearance: Normal appearance. She is not ill-appearing.  HENT:     Head: Normocephalic and atraumatic.  Eyes:     General: No scleral icterus.    Conjunctiva/sclera: Conjunctivae normal.  Cardiovascular:     Rate and Rhythm: Normal rate and regular rhythm.  Pulmonary:     Effort: Pulmonary effort is normal. No respiratory distress.     Breath sounds: No wheezing.  Skin:    General: Skin is warm and dry.     Findings: No rash.  Neurological:  Mental Status: She is alert.     Comments: Cranial nerves II through XII grossly intact.  Patient is following commands.  Normal strength in bilateral upper and lower extremities.  Alert and oriented x 2, which is not her baseline.  Psychiatric:        Mood and Affect: Mood normal.     ED Results / Procedures / Treatments   Labs (all labs ordered are listed, but only abnormal results are displayed) Labs Reviewed - No data to display  EKG None  Radiology No results found.  Procedures Procedures   Medications Ordered in ED Medications  sodium chloride 0.9 % bolus 1,000 mL (0 mLs Intravenous Stopped 05/18/22 1113)  potassium chloride SA (KLOR-CON M) CR tablet 20 mEq (20 mEq Oral Given 05/18/22 0814)  sodium chloride 0.9 % bolus 1,000 mL (0 mLs Intravenous Stopped 05/18/22 1252)    ED Course/ Medical Decision Making/ A&P Clinical Course as of 05/18/22 1512  Sun May 18, 2022  0736 Call to daughter x2 [MR]  [phone] Resting comfortably, arousable and protecting her airway [MR]  Shedd(!): MODERATE [MR]  1454 WBC, UA: 11-20 [MR]  1454 Bacteria, UA(!): RARE [MR]    Clinical Course User Index [MR] Holly Holly, Holly Asper, PA-C                             Medical Decision  Making Amount and/or Complexity of Data Reviewed Labs: ordered. Decision-making details documented in ED Course. Radiology: ordered.  Risk Prescription drug management.   67 year old female presenting with altered mental status.  Differential includes but is not limited to to urinary tract infection, CVA/recurrent TIA, electrolyte abnormality, intoxication, dementia, uremia.  This is not an exhaustive differential.    Past Medical History / Co-morbidities / Social History: Paroxysmal A-fib, TIA both diagnosed in March 2023.  Recurrent UTIs leading to altered mental status.  Questionable drug use.  Chronic opioid use secondary to fibromyalgia   Additional history: Per chart review patient presented altered in November 2023.  She also was exhibiting the hyperactivity at that time.  At that time she was diagnosed with a UTI.  In June 2023 patient presented to [facility] with altered mental status.  At that time she also was off balance and complaining of a headache.  Negative for CVA at that time but she was also diagnosed with a UTI  Today in March 2023 secondary to paroxysmal A-fib.  At that time she had slurred speech and facial droop, not present currently.   I spoke with the patient's daughter.  She reports that last night her mother seemed to be having trouble with ADLs.  She went to sleep and the daughter went to check on her and she was talking to people who were not in the room, calling out "mama and papa" despite never calling her father papa and both of them being deceased.  Denies that there are any drugs in the house.  She reports that she controls her mother's oxycodone as well so there is no way that she took too much of this either.    Of Holly patient does wear a brief urinary and stool incontinence.  Likely the source of recurrent UTIs.  Physical Exam: Pertinent physical exam findings include Alert and oriented x 2 Some difficulty with EOM testing as she continues  to nod off.  She does not have any nystagmus and PERRLA bilaterally.  EMS reported pupillary constriction  that is not appreciated on my physical exam  Lab Tests: I ordered, and personally interpreted labs.  The pertinent results include: Normal white count Potassium 3.3, repleted Mild AKI   Imaging Studies: I ordered and independently visualized and interpreted CT head and I agree with the radiologist that there are no acute findings   Cardiac Monitoring:  The patient was maintained on a cardiac monitor.  I viewed and interpreted the cardiac monitored which showed an underlying rhythm of: Sinus   Medications: Fluids, K and ativan as needed   MDM/Disposition: This is a 67 year old female presenting today with altered mental status.  Has a history of this with urinary tract infections.  Also was found with cocaine around her.  Head CT was normal.  Lab work shows a mild hypokalemia and mild AKI however nothing that should be causing her AMS.  I have a strong suspicion ingestion versus UTI versus both.  Unfortunately nursing staff tried to do an In-N-Out cath x 2 and were unsuccessful.  She was placed on a pure wick with no urine output.  She has been given 2 fluid boluses.  Was able to urinate in the bedside commode however she also had a bowel movement so the sample was contaminated.  Patient will need to be signed out to oncoming team pending UA.  Suspect UTI causing her symptoms.  3:15pm: Spoke with the patient's daughter who will pick the patient up shortly and is comfortable with her being discharged home despite her previous altered mental status.  Given a dose of Rocephin and will be discharged home with Keflex.  Everybody is agreeable to the plan and patient will be discharged home.  She was complaining of pain so she will be discharged with 8 pills worth of Percocet which she takes chronically outpatient.  Final Clinical Impression(s) / ED Diagnoses Final diagnoses:  Hypokalemia  Acute  cystitis without hematuria    Rx / DC Orders ED Discharge Orders          Ordered    cephALEXin (KEFLEX) 500 MG capsule  3 times daily        05/18/22 1455    oxyCODONE-acetaminophen (PERCOCET/ROXICET) 5-325 MG tablet  Every 6 hours PRN        05/18/22 1517           Results and diagnoses were explained to the patient. Return precautions discussed in full. Patient had no additional questions and expressed complete understanding.   This chart was dictated using voice recognition software.  Despite best efforts to proofread,  errors can occur which can change the documentation meaning.     Saddie Benders, PA-C 05/18/22 1518    Melene Plan, DO 05/21/22 [phone]

## 2022-05-18 NOTE — Discharge Instructions (Addendum)
You have a urinary tract infection.  Take the antibiotics at your pharmacy 3 times a day.  Your pain medication has also been refilled.  Please follow-up with your PCP outpatient.  It was a pleasure to meet you and do not hesitate to return with any worsening symptoms.

## 2022-05-18 NOTE — ED Notes (Signed)
Bladder scan shows 300cc. PA notified. New orders for 1L NS.

## 2022-05-18 NOTE — ED Triage Notes (Addendum)
Pt BIB by [facility] from home. Pts daughter called ems due to pt "not acting right" and taking longer to perform task ADLs than usual. Pt also dosing off but easy to arouse; and hallucinating. Family also reports a fall on 2/2. Pt has hx of RA/osteoporosis controlled with Oxy, last taken at 8pm. Per EMS large amounts of cocaine noted on the table of the residence; pt denies using any.

## 2023-10-25 ENCOUNTER — Other Ambulatory Visit: Payer: Self-pay

## 2023-10-25 ENCOUNTER — Emergency Department (HOSPITAL_COMMUNITY)

## 2023-10-25 ENCOUNTER — Inpatient Hospital Stay (HOSPITAL_COMMUNITY)
Admission: EM | Admit: 2023-10-25 | Discharge: 2023-10-28 | DRG: 065 | Disposition: A | Discharge status: Rehabilitation Facility | Attending: Internal Medicine | Admitting: Internal Medicine

## 2023-10-25 DIAGNOSIS — I63512 Cerebral infarction due to unspecified occlusion or stenosis of left middle cerebral artery: Secondary | ICD-10-CM | POA: Diagnosis not present

## 2023-10-25 DIAGNOSIS — R7303 Prediabetes: Secondary | ICD-10-CM | POA: Diagnosis present

## 2023-10-25 DIAGNOSIS — E7800 Pure hypercholesterolemia, unspecified: Secondary | ICD-10-CM | POA: Diagnosis present

## 2023-10-25 DIAGNOSIS — R2990 Unspecified symptoms and signs involving the nervous system: Secondary | ICD-10-CM | POA: Diagnosis present

## 2023-10-25 DIAGNOSIS — M1990 Unspecified osteoarthritis, unspecified site: Secondary | ICD-10-CM | POA: Diagnosis present

## 2023-10-25 DIAGNOSIS — F419 Anxiety disorder, unspecified: Secondary | ICD-10-CM | POA: Diagnosis present

## 2023-10-25 DIAGNOSIS — I639 Cerebral infarction, unspecified: Principal | ICD-10-CM | POA: Diagnosis present

## 2023-10-25 DIAGNOSIS — R29704 NIHSS score 4: Secondary | ICD-10-CM | POA: Diagnosis not present

## 2023-10-25 DIAGNOSIS — F32A Depression, unspecified: Secondary | ICD-10-CM | POA: Diagnosis present

## 2023-10-25 DIAGNOSIS — I6381 Other cerebral infarction due to occlusion or stenosis of small artery: Secondary | ICD-10-CM

## 2023-10-25 DIAGNOSIS — I4891 Unspecified atrial fibrillation: Secondary | ICD-10-CM | POA: Diagnosis not present

## 2023-10-25 DIAGNOSIS — G8191 Hemiplegia, unspecified affecting right dominant side: Secondary | ICD-10-CM | POA: Diagnosis present

## 2023-10-25 DIAGNOSIS — M797 Fibromyalgia: Secondary | ICD-10-CM | POA: Diagnosis present

## 2023-10-25 DIAGNOSIS — K219 Gastro-esophageal reflux disease without esophagitis: Secondary | ICD-10-CM | POA: Diagnosis present

## 2023-10-25 DIAGNOSIS — M069 Rheumatoid arthritis, unspecified: Secondary | ICD-10-CM | POA: Diagnosis present

## 2023-10-25 DIAGNOSIS — N3281 Overactive bladder: Secondary | ICD-10-CM | POA: Diagnosis present

## 2023-10-25 DIAGNOSIS — Z79899 Other long term (current) drug therapy: Secondary | ICD-10-CM

## 2023-10-25 DIAGNOSIS — I10 Essential (primary) hypertension: Secondary | ICD-10-CM | POA: Diagnosis present

## 2023-10-25 DIAGNOSIS — Z7901 Long term (current) use of anticoagulants: Secondary | ICD-10-CM

## 2023-10-25 DIAGNOSIS — Z6831 Body mass index (BMI) 31.0-31.9, adult: Secondary | ICD-10-CM

## 2023-10-25 DIAGNOSIS — K5900 Constipation, unspecified: Secondary | ICD-10-CM | POA: Diagnosis present

## 2023-10-25 DIAGNOSIS — I700 Atherosclerosis of aorta: Secondary | ICD-10-CM | POA: Diagnosis present

## 2023-10-25 DIAGNOSIS — E669 Obesity, unspecified: Secondary | ICD-10-CM | POA: Diagnosis present

## 2023-10-25 DIAGNOSIS — I480 Paroxysmal atrial fibrillation: Secondary | ICD-10-CM | POA: Diagnosis present

## 2023-10-25 DIAGNOSIS — Z7982 Long term (current) use of aspirin: Secondary | ICD-10-CM

## 2023-10-25 DIAGNOSIS — I63412 Cerebral infarction due to embolism of left middle cerebral artery: Secondary | ICD-10-CM | POA: Diagnosis not present

## 2023-10-25 DIAGNOSIS — Z888 Allergy status to other drugs, medicaments and biological substances status: Secondary | ICD-10-CM

## 2023-10-25 DIAGNOSIS — I6522 Occlusion and stenosis of left carotid artery: Secondary | ICD-10-CM | POA: Diagnosis present

## 2023-10-25 LAB — COMPREHENSIVE METABOLIC PANEL WITH GFR
ALT: 18 U/L (ref 0–44)
AST: 14 U/L — ABNORMAL LOW (ref 15–41)
Albumin: 3.6 g/dL (ref 3.5–5.0)
Alkaline Phosphatase: 81 U/L (ref 38–126)
Anion gap: 10 (ref 5–15)
BUN: 17 mg/dL (ref 8–23)
CO2: 24 mmol/L (ref 22–32)
Calcium: 8.9 mg/dL (ref 8.9–10.3)
Chloride: 105 mmol/L (ref 98–111)
Creatinine, Ser: 0.92 mg/dL (ref 0.44–1.00)
GFR, Estimated: >60 mL/min (ref >60)
Glucose, Bld: 93 mg/dL (ref 70–99)
Potassium: 4 mmol/L (ref 3.5–5.1)
Sodium: 139 mmol/L (ref 135–145)
Total Bilirubin: 1 mg/dL (ref 0.0–1.2)
Total Protein: 6.4 g/dL — ABNORMAL LOW (ref 6.5–8.1)

## 2023-10-25 LAB — DIFFERENTIAL
Abs Immature Granulocytes: 0.02 K/uL (ref 0.00–0.07)
Basophils Absolute: 0.1 K/uL (ref 0.0–0.1)
Basophils Relative: 1 %
Eosinophils Absolute: 0.1 K/uL (ref 0.0–0.5)
Eosinophils Relative: 2 %
Immature Granulocytes: 0 %
Lymphocytes Relative: 19 %
Lymphs Abs: 1.5 K/uL (ref 0.7–4.0)
Monocytes Absolute: 1 K/uL (ref 0.1–1.0)
Monocytes Relative: 13 %
Neutro Abs: 5 K/uL (ref 1.7–7.7)
Neutrophils Relative %: 65 %

## 2023-10-25 LAB — I-STAT CHEM 8, ED
BUN: 18 mg/dL (ref 8–23)
Calcium, Ion: 1.1 mmol/L — ABNORMAL LOW (ref 1.15–1.40)
Chloride: 107 mmol/L (ref 98–111)
Creatinine, Ser: 1 mg/dL (ref 0.44–1.00)
Glucose, Bld: 88 mg/dL (ref 70–99)
HCT: 36 % (ref 36.0–46.0)
Hemoglobin: 12.2 g/dL (ref 12.0–15.0)
Potassium: 4 mmol/L (ref 3.5–5.1)
Sodium: 141 mmol/L (ref 135–145)
TCO2: 25 mmol/L (ref 22–32)

## 2023-10-25 LAB — CBC
HCT: 37.4 % (ref 36.0–46.0)
Hemoglobin: 12 g/dL (ref 12.0–15.0)
MCH: 30.3 pg (ref 26.0–34.0)
MCHC: 32.1 g/dL (ref 30.0–36.0)
MCV: 94.4 fL (ref 80.0–100.0)
Platelets: 254 K/uL (ref 150–400)
RBC: 3.96 MIL/uL (ref 3.87–5.11)
RDW: 13.1 % (ref 11.5–15.5)
WBC: 7.6 K/uL (ref 4.0–10.5)
nRBC: 0 % (ref 0.0–0.2)

## 2023-10-25 LAB — APTT: aPTT: 26 s (ref 24–36)

## 2023-10-25 LAB — CBG MONITORING, ED: Glucose-Capillary: 96 mg/dL (ref 70–99)

## 2023-10-25 LAB — PROTIME-INR
INR: 1.3 — ABNORMAL HIGH (ref 0.8–1.2)
Prothrombin Time: 17.1 s — ABNORMAL HIGH (ref 11.4–15.2)

## 2023-10-25 LAB — ETHANOL: Alcohol, Ethyl (B): <15 mg/dL (ref <15)

## 2023-10-25 MED ORDER — ATORVASTATIN CALCIUM 80 MG PO TABS
80.0000 mg | ORAL_TABLET | Freq: Every day | ORAL | Status: DC
  Administered 2023-10-26 – 2023-10-28 (×3): 80 mg via ORAL
  Filled 2023-10-25 (×2): qty 2
  Filled 2023-10-25: qty 1

## 2023-10-25 MED ORDER — IOHEXOL 350 MG/ML SOLN
75.0000 mL | Freq: Once | INTRAVENOUS | Status: AC | PRN
  Administered 2023-10-25: 75 mL via INTRAVENOUS

## 2023-10-25 MED ORDER — STROKE: EARLY STAGES OF RECOVERY BOOK
Freq: Once | Status: AC
  Filled 2023-10-25: qty 1

## 2023-10-25 MED ORDER — APIXABAN 5 MG PO TABS
5.0000 mg | ORAL_TABLET | Freq: Two times a day (BID) | ORAL | Status: DC
  Administered 2023-10-26 (×2): 5 mg via ORAL
  Filled 2023-10-25 (×2): qty 1

## 2023-10-25 NOTE — ED Provider Notes (Signed)
 [place] EMERGENCY DEPARTMENT AT [facility] Provider Note   CSN: [id number] Arrival date & time: 10/25/23  2209  An emergency department physician performed an initial assessment on this suspected stroke patient at 2210.  Patient presents with: Code Stroke   Boneta Standre is a 68 y.o. female.   68 year old female with past medical history of atrial fibrillation on Eliquis  as well as hypertension hyperlipidemia presenting to the emergency department today with right-sided weakness.  This apparently occurred at 9 PM.  The patient was doing dishes when she started feeling weakness in her right upper extremity and then right lower extremity.  This was noted with medics on arrival as well.  She is on Eliquis  and has been taking this as prescribed.  Last dose was this morning.  She reports a very mild headache with this.        Prior to Admission medications   Medication Sig Start Date End Date Taking? Authorizing Provider  acetaminophen  (TYLENOL ) 500 MG tablet Take 1,000 mg by mouth every 8 (eight) hours as needed for mild pain or headache.    [provider]  albuterol (PROVENTIL) (2.5 MG/3ML) 0.083% nebulizer solution Take 2.5 mg by nebulization every 6 (six) hours as needed for wheezing or shortness of breath (allergies).    [provider]  albuterol (VENTOLIN HFA) 108 (90 Base) MCG/ACT inhaler Inhale 2 puffs into the lungs every 6 (six) hours as needed for wheezing or shortness of breath (allergies). 04/25/20   [provider]  apixaban  (ELIQUIS ) 5 MG TABS tablet Take 5 mg by mouth 2 (two) times daily.    [provider]  atorvastatin  (LIPITOR ) 80 MG tablet Take 80 mg by mouth in the morning. 07/13/20   [provider]  baclofen (LIORESAL) 10 MG tablet Take 10 mg by mouth at bedtime as needed for muscle spasms. 07/20/20   [provider]  busPIRone  (BUSPAR ) 30 MG tablet Take 30 mg by mouth 2 (two) times daily. 09/18/20   [provider]  cephALEXin  (KEFLEX ) 500 MG capsule Take 1 capsule (500 mg total) by mouth 3 (three) times daily. 05/18/22   Redwine, Madison A, PA-C  COVID-19 mRNA bivalent vaccine, Pfizer, (PFIZER COVID-19 VAC BIVALENT) injection Inject into the muscle. Patient not taking: Reported on 06/29/2021 02/28/21   Luiz Channel, MD  diclofenac Sodium (VOLTAREN) 1 % GEL Apply 1 application topically 2 (two) times daily as needed (arthritis pain).    [provider]  ezetimibe  (ZETIA ) 10 MG tablet Take 10 mg by mouth in the morning. 07/28/20   [provider]  flecainide (TAMBOCOR) 50 MG tablet Take 50 mg by mouth 2 (two) times daily.    [provider]  folic acid (FOLVITE) 1 MG tablet Take 1 mg by mouth at bedtime. 09/27/20   [provider]  furosemide  (LASIX ) 20 MG tablet Take 1 tablet (20 mg total) by mouth daily. Patient taking differently: Take 20 mg by mouth daily as needed for fluid or edema (feet swelling). 06/21/20   Horton, Charmaine FALCON, MD  gabapentin  (NEURONTIN ) 600 MG tablet Take 600 mg by mouth 3 (three) times daily. Patient not taking: Reported on 06/29/2021 09/22/20   [provider]  hydrochlorothiazide (HYDRODIURIL) 25 MG tablet Take 25 mg by mouth in the morning. 07/13/20   [provider]  hydroxychloroquine (PLAQUENIL) 200 MG tablet Take 200 mg by mouth daily.    [provider]  hydrOXYzine (ATARAX) 50 MG tablet Take 50 mg by  mouth at bedtime.    [provider]  influenza vaccine adjuvanted (FLUAD) 0.5 ML injection Inject into the muscle. Patient not taking: Reported on 06/29/2021 02/28/21   Luiz Channel, MD  latanoprost (XALATAN) 0.005 % ophthalmic solution Place 1 drop into both eyes at bedtime. 08/10/20   [provider]  loratadine (CLARITIN) 10 MG tablet Take 10 mg by mouth every morning. 05/02/20   [provider]  methotrexate (RHEUMATREX) 2.5 MG tablet Take 15 mg by mouth every  Thursday. Patient not taking: Reported on 06/29/2021 05/28/20   [provider]  methotrexate 250 MG/10ML injection Inject 15 mg into the skin every Wednesday. 06/19/21   [provider]  metoprolol tartrate (LOPRESSOR) 25 MG tablet Take 25 mg by mouth 2 (two) times daily. 09/12/20   [provider]  montelukast (SINGULAIR) 10 MG tablet Take 10 mg by mouth daily. 07/17/20   [provider]  morphine (MS CONTIN) 15 MG 12 hr tablet Take 15 mg by mouth every 12 (twelve) hours.    [provider]  Multiple Vitamin (MULTIVITAMIN WITH MINERALS) TABS tablet Take 1 tablet by mouth daily.    [provider]  naproxen sodium (ALEVE) 220 MG tablet Take 440 mg by mouth daily as needed (pain).    [provider]  nystatin-triamcinolone ointment (MYCOLOG) Apply 1 application topically See admin instructions. Apply topically to perineal area up to three times daily as needed for rash/itching. 02/20/20   [provider]  ondansetron  (ZOFRAN -ODT) 4 MG disintegrating tablet Take 1 tablet (4 mg total) by mouth every 8 (eight) hours as needed for nausea or vomiting. 02/22/22   Dreama Longs, MD  oxyCODONE  (OXY IR/ROXICODONE ) 5 MG immediate release tablet Take 5 mg by mouth 2 (two) times daily as needed for breakthrough pain. 09/21/20   [provider]  oxyCODONE -acetaminophen  (PERCOCET/ROXICET) 5-325 MG tablet Take 1 tablet by mouth every 6 (six) hours as needed for severe pain. 05/18/22   Redwine, Madison A, PA-C  pantoprazole  (PROTONIX ) 40 MG tablet Take 40 mg by mouth every morning. 09/10/20   [provider]  pregabalin (LYRICA) 25 MG capsule Take 25 mg by mouth 2 (two) times daily.    [provider]  traMADol (ULTRAM-ER) 300 MG 24 hr tablet Take 300 mg by mouth every morning. Patient not taking: Reported on 06/29/2021 09/12/20   [provider]  Zoster Vaccine Adjuvanted (SHINGRIX ) injection Inject into the  muscle. Patient not taking: Reported on 06/29/2021 02/28/21   Luiz Channel, MD    Allergies: Patient has no known allergies.    Review of Systems  Neurological:  Positive for weakness and numbness.  All other systems reviewed and are negative.   Updated Vital Signs BP (!) 165/78   Pulse 66   Temp 98.1 F (36.7 C) (Oral)   Resp (!) 23   Ht 5' 1 (1.549 m)   Wt 74.8 kg   SpO2 96%   BMI 31.18 kg/m   Physical Exam Vitals and nursing note reviewed.   Gen: NAD Eyes: PERRL, EOMI HEENT: no oropharyngeal swelling Neck: trachea midline Resp: clear to auscultation bilaterally Card: RRR, no murmurs, rubs, or gallops Abd: nontender, nondistended Extremities: no calf tenderness, no edema Vascular: 2+ radial pulses bilaterally, 2+ DP pulses bilaterally Neuro: Cranial nerves intact, the patient has right upper and right lower extremity weakness compared to the left.  Please see NIH stroke scale from stroke team who evaluated patient on my initial evaluation as well Skin: no  rashes Psyc: acting appropriately   (all labs ordered are listed, but only abnormal results are displayed) Labs Reviewed  PROTIME-INR - Abnormal; Notable for the following components:      Result Value   Prothrombin Time 17.1 (*)    INR 1.3 (*)    All other components within normal limits  COMPREHENSIVE METABOLIC PANEL WITH GFR - Abnormal; Notable for the following components:   Total Protein 6.4 (*)    AST 14 (*)    All other components within normal limits  I-STAT CHEM 8, ED - Abnormal; Notable for the following components:   Calcium , Ion 1.10 (*)    All other components within normal limits  ETHANOL  APTT  CBC  DIFFERENTIAL  RAPID URINE DRUG SCREEN, HOSP PERFORMED  LIPID PANEL  HEMOGLOBIN A1C  CBG MONITORING, ED    EKG: None  Radiology: CT ANGIO HEAD NECK W WO CM (CODE STROKE) Result Date: 10/25/2023 CLINICAL DATA:  Neuro deficit, acute, stroke suspected EXAM: CT ANGIOGRAPHY HEAD AND NECK  WITH AND WITHOUT CONTRAST TECHNIQUE: Multidetector CT imaging of the head and neck was performed using the standard protocol during bolus administration of intravenous contrast. Multiplanar CT image reconstructions and MIPs were obtained to evaluate the vascular anatomy. Carotid stenosis measurements (when applicable) are obtained utilizing NASCET criteria, using the distal internal carotid diameter as the denominator. RADIATION DOSE REDUCTION: This exam was performed according to the departmental dose-optimization program which includes automated exposure control, adjustment of the mA and/or kV according to patient size and/or use of iterative reconstruction technique. CONTRAST:  75mL OMNIPAQUE  IOHEXOL  350 MG/ML SOLN COMPARISON:  None Available. FINDINGS: CTA NECK FINDINGS Aortic arch: Great vessel origins are patent. Aortic atherosclerosis. Right carotid system: No evidence of dissection, stenosis (50% or greater), or occlusion. Left carotid system: Approximately 50% stenosis of the left proximal ICA due to atherosclerosis. Vertebral arteries: Severe left vertebral artery origin stenosis. Patent bilaterally. Right dominant. Skeleton: No evidence of acute abnormality on limited assessment. Other neck: No evidence of acute abnormality on limited assessment. Upper chest: Visualized lung apices are clear. Review of the MIP images confirms the above findings CTA HEAD FINDINGS Anterior circulation: Bilateral intracranial ICAs, MCAs, and are patent without proximal hemodynamically significant stenosis. Posterior circulation: Bilateral intradural vertebral arteries, basilar artery and bilateral posterior arteries are patent without proximal 8 image least significant stenosis. Venous sinuses: As permitted by contrast timing, patent. Review of the MIP images confirms the above findings IMPRESSION: 1. No emergent large vessel occlusion. 2. Severe left vertebral artery origin stenosis. 3. Approximately 50% stenosis of the  left proximal ICA. 4. Aortic Atherosclerosis (ICD10-I70.0). Electronically Signed   By: Gilmore GORMAN Molt M.D.   On: 10/25/2023 22:53   CT HEAD CODE STROKE WO CONTRAST Result Date: 10/25/2023 CLINICAL DATA:  Code stroke.  Neuro deficit, acute, stroke suspected EXAM: CT HEAD WITHOUT CONTRAST TECHNIQUE: Contiguous axial images were obtained from the base of the skull through the vertex without intravenous contrast. RADIATION DOSE REDUCTION: This exam was performed according to the departmental dose-optimization program which includes automated exposure control, adjustment of the mA and/or kV according to patient size and/or use of iterative reconstruction technique. COMPARISON:  CT headMay 9, 2025. FINDINGS: Brain: Similar patchy hypodensities in the white matter and deep gray nuclei, compatible with chronic microvascular ischemic change. Small remote infarct in the left caudate superiorly. No evidence of acute large vascular territory infarct, acute hemorrhage, mass lesion or midline shift. Vascular: No hyperdense vessel. Skull: No acute fracture. Sinuses/Orbits: Clear  sinuses.  No acute orbital findings. Other: No mastoid effusions. ASPECTS [facility] Stroke Program Early CT Score) Total score (0-10 with 10 being normal): 10. IMPRESSION: 1. No evidence of acute intracranial abnormality. ASPECTS is 10. 2. Chronic microvascular ischemic change. Code stroke imaging results were communicated on 10/25/2023 at 10:34 pm to provider Dr. Merrianne via secure text paging. Electronically Signed   By: Gilmore GORMAN Molt M.D.   On: 10/25/2023 22:35     Procedures   Medications Ordered in the ED  apixaban  (ELIQUIS ) tablet 5 mg (has no administration in time range)   stroke: early stages of recovery book (has no administration in time range)  atorvastatin  (LIPITOR ) tablet 80 mg (has no administration in time range)  iohexol  (OMNIPAQUE ) 350 MG/ML injection 75 mL (75 mLs Intravenous Contrast Given 10/25/23 2230)                                     Medical Decision Making 68 year old female with past medical history of atrial fibrillation on Eliquis  and hypertension as well as TIA in the past presenting to the emergency department today with right-sided weakness.  A code stroke is initiated.  Will CT and CT angiogram the patient here and discussed case with neurology but she will likely require admission.  She is not a TNKase candidate due to being on anticoagulation.  The patient's labs are largely unremarkable.  CT angiogram does not show any large vessel occlusion.  Plan is for admission for permissive hypertension and medical management.  A call was placed to hospitalist service for admission.  Amount and/or Complexity of Data Reviewed Labs: ordered. Radiology: ordered.  Risk Decision regarding hospitalization.        Final diagnoses:  Cerebrovascular accident (CVA), unspecified mechanism [facility])    ED Discharge Orders     None          Ula Prentice SAUNDERS, MD 10/25/23 2341

## 2023-10-25 NOTE — Consult Note (Signed)
 NEUROLOGY CONSULT NOTE   Date of service: October 25, 2023 Patient Name: Holly Henry MRN:  [id number] DOB:  11-12-1955 Chief Complaint: Acute onset of right arm > leg weakness without sensory loss Requesting Provider: Ula Prentice SAUNDERS, MD  History of Present Illness  Holly Henry is a 68 y.o. female with a PMHx of atrial fibrillation (on Eliquis ), fibromyalgia, hypercholesterolemia, HTN, osteoarthritis, prediabetes and rheumatoid arthritis  who presents to the ED via EMS as a Code Stroke after acute onset of right sided weakness. Her symptoms started at 9 PM while she was at home playing with her grandchildren, at which time she noticed her right side was weaker than her left. She then noticed weakness in her right lower extremity. EMS noted right sided weakness with inability to ambulate. BP with EMS was 150/106. She is alert and oriented on arrival to the ED. She states that she took her last dose of Eliquis  this morning. She reports a very mild headache.   LKW: 2100 Modified rankin score: 0-Completely asymptomatic and back to baseline post- stroke IV Thrombolysis: No: On Eliquis  EVT: No: CTA is negative for LVO  NIHSS components Score: Comment  1a Level of Conscious 0[x]  1[]  2[]  3[]      1b LOC Questions 0[x]  1[]  2[]       1c LOC Commands 0[x]  1[]  2[]       2 Best Gaze 0[x]  1[]  2[]       3 Visual 0[x]  1[]  2[]  3[]      4 Facial Palsy 0[x]  1[]  2[]  3[]      5a Motor Arm - left 0[x]  1[]  2[]  3[]  4[]  UN[]    5b Motor Arm - Right 0[]  1[]  2[]  3[x]  4[]  UN[]   Able to move thumb slightly and weakly adduct fingers a few mm. Wrist flexion/extension 0/5, biceps 1/5, shoulder shrug 2/5  6a Motor Leg - Left 0[x]  1[]  2[]  3[]  4[]  UN[]    6b Motor Leg - Right 0[]  1[x]  2[]  3[]  4[]  UN[]   4/5 strength against resistance  7 Limb Ataxia 0[x]  1[]  2[]  UN[]      8 Sensory 0[x]  1[]  2[]  UN[]      9 Best Language 0[x]  1[]  2[]  3[]      10 Dysarthria 0[x]  1[]  2[]  UN[]      11 Extinct. and Inattention 0[x]  1[]  2[]       TOTAL:  4       ROS  Comprehensive ROS performed and pertinent positives documented in HPI    Past History   Past Medical History:  Diagnosis Date   A-fib (HCC)    Fibromyalgia    High cholesterol    Hypertension    Osteoarthritis    Prediabetes    Rheumatoid arthritis (HCC)     Past Surgical History:  Procedure Laterality Date   BACK SURGERY     CATARACT EXTRACTION Bilateral    TONSILLECTOMY      Family History: No family history on file.  Social History  reports that she has never smoked. She has never used smokeless tobacco. She reports that she does not drink alcohol and does not use drugs.  No Known Allergies  Medications  No current facility-administered medications for this encounter.  Current Outpatient Medications:    acetaminophen  (TYLENOL ) 500 MG tablet, Take 1,000 mg by mouth every 8 (eight) hours as needed for mild pain or headache., Disp: , Rfl:    albuterol (PROVENTIL) (2.5 MG/3ML) 0.083% nebulizer solution, Take 2.5 mg by nebulization every 6 (six) hours as needed for wheezing or shortness of breath (allergies).,  Disp: , Rfl:    albuterol (VENTOLIN HFA) 108 (90 Base) MCG/ACT inhaler, Inhale 2 puffs into the lungs every 6 (six) hours as needed for wheezing or shortness of breath (allergies)., Disp: , Rfl:    apixaban  (ELIQUIS ) 5 MG TABS tablet, Take 5 mg by mouth 2 (two) times daily., Disp: , Rfl:    atorvastatin  (LIPITOR ) 80 MG tablet, Take 80 mg by mouth in the morning., Disp: , Rfl:    baclofen (LIORESAL) 10 MG tablet, Take 10 mg by mouth at bedtime as needed for muscle spasms., Disp: , Rfl:    busPIRone  (BUSPAR ) 30 MG tablet, Take 30 mg by mouth 2 (two) times daily., Disp: , Rfl:    cephALEXin  (KEFLEX ) 500 MG capsule, Take 1 capsule (500 mg total) by mouth 3 (three) times daily., Disp: 20 capsule, Rfl: 0   COVID-19 mRNA bivalent vaccine, Pfizer, (PFIZER COVID-19 VAC BIVALENT) injection, Inject into the muscle. (Patient not taking: Reported on 06/29/2021), Disp:  0.3 mL, Rfl: 0   diclofenac Sodium (VOLTAREN) 1 % GEL, Apply 1 application topically 2 (two) times daily as needed (arthritis pain)., Disp: , Rfl:    ezetimibe  (ZETIA ) 10 MG tablet, Take 10 mg by mouth in the morning., Disp: , Rfl:    flecainide (TAMBOCOR) 50 MG tablet, Take 50 mg by mouth 2 (two) times daily., Disp: , Rfl:    folic acid (FOLVITE) 1 MG tablet, Take 1 mg by mouth at bedtime., Disp: , Rfl:    furosemide  (LASIX ) 20 MG tablet, Take 1 tablet (20 mg total) by mouth daily. (Patient taking differently: Take 20 mg by mouth daily as needed for fluid or edema (feet swelling).), Disp: 2 tablet, Rfl: 0   gabapentin  (NEURONTIN ) 600 MG tablet, Take 600 mg by mouth 3 (three) times daily. (Patient not taking: Reported on 06/29/2021), Disp: , Rfl:    hydrochlorothiazide (HYDRODIURIL) 25 MG tablet, Take 25 mg by mouth in the morning., Disp: , Rfl:    hydroxychloroquine (PLAQUENIL) 200 MG tablet, Take 200 mg by mouth daily., Disp: , Rfl:    hydrOXYzine (ATARAX) 50 MG tablet, Take 50 mg by mouth at bedtime., Disp: , Rfl:    influenza vaccine adjuvanted (FLUAD) 0.5 ML injection, Inject into the muscle. (Patient not taking: Reported on 06/29/2021), Disp: 0.5 mL, Rfl: 0   latanoprost (XALATAN) 0.005 % ophthalmic solution, Place 1 drop into both eyes at bedtime., Disp: , Rfl:    loratadine (CLARITIN) 10 MG tablet, Take 10 mg by mouth every morning., Disp: , Rfl:    methotrexate (RHEUMATREX) 2.5 MG tablet, Take 15 mg by mouth every Thursday. (Patient not taking: Reported on 06/29/2021), Disp: , Rfl:    methotrexate 250 MG/10ML injection, Inject 15 mg into the skin every Wednesday., Disp: , Rfl:    metoprolol tartrate (LOPRESSOR) 25 MG tablet, Take 25 mg by mouth 2 (two) times daily., Disp: , Rfl:    montelukast (SINGULAIR) 10 MG tablet, Take 10 mg by mouth daily., Disp: , Rfl:    morphine (MS CONTIN) 15 MG 12 hr tablet, Take 15 mg by mouth every 12 (twelve) hours., Disp: , Rfl:    Multiple Vitamin  (MULTIVITAMIN WITH MINERALS) TABS tablet, Take 1 tablet by mouth daily., Disp: , Rfl:    naproxen sodium (ALEVE) 220 MG tablet, Take 440 mg by mouth daily as needed (pain)., Disp: , Rfl:    nystatin-triamcinolone ointment (MYCOLOG), Apply 1 application topically See admin instructions. Apply topically to perineal area up to three times daily  as needed for rash/itching., Disp: , Rfl:    ondansetron  (ZOFRAN -ODT) 4 MG disintegrating tablet, Take 1 tablet (4 mg total) by mouth every 8 (eight) hours as needed for nausea or vomiting., Disp: 20 tablet, Rfl: 0   oxyCODONE  (OXY IR/ROXICODONE ) 5 MG immediate release tablet, Take 5 mg by mouth 2 (two) times daily as needed for breakthrough pain., Disp: , Rfl:    oxyCODONE -acetaminophen  (PERCOCET/ROXICET) 5-325 MG tablet, Take 1 tablet by mouth every 6 (six) hours as needed for severe pain., Disp: 8 tablet, Rfl: 0   pantoprazole  (PROTONIX ) 40 MG tablet, Take 40 mg by mouth every morning., Disp: , Rfl:    pregabalin (LYRICA) 25 MG capsule, Take 25 mg by mouth 2 (two) times daily., Disp: , Rfl:    traMADol (ULTRAM-ER) 300 MG 24 hr tablet, Take 300 mg by mouth every morning. (Patient not taking: Reported on 06/29/2021), Disp: , Rfl:    Zoster Vaccine Adjuvanted (SHINGRIX ) injection, Inject into the muscle. (Patient not taking: Reported on 06/29/2021), Disp: 0.5 mL, Rfl: 1  Vitals   There were no vitals filed for this visit.  There is no height or weight on file to calculate BMI.   Physical Exam   Constitutional: Appears well-developed and well-nourished.  Eyes: No scleral injection.  HENT: No OP obstruction.  Head: Normocephalic.  Respiratory: Effort normal, non-labored breathing.    Neurologic Examination   See NIHSS  Labs/Imaging/Neurodiagnostic studies   CBC:  Recent Labs  Lab Oct 31, 2023 2217  HGB 12.2  HCT 36.0     ASSESSMENT  Holly Henry is a 68 y.o. female with a PMHx of atrial fibrillation (on Eliquis ), fibromyalgia,  hypercholesterolemia, HTN, osteoarthritis, prediabetes and rheumatoid arthritis  who presents to the ED via EMS as a Code Stroke after acute onset of right sided weakness. Her symptoms started at 9 PM while she was at home playing with her grandchildren, at which time she noticed her right side was weaker than her left. She then noticed weakness in her right lower extremity. EMS noted right sided weakness with inability to ambulate. She is alert and oriented on arrival to the ED. She states that she took her last dose of Eliquis  this morning. She reports a very mild headache. - Exam reveals severe RUE and mild RLE weakness. NIHSS 4.  - CT head: No evidence of acute intracranial abnormality. ASPECTS is 10. Chronic microvascular ischemic change. - CTA of head and neck: No emergent large vessel occlusion. Severe left vertebral artery origin stenosis. 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 Aortic atherosclerosis  - EKG: Sinus rhythm; Normal ECG - Impression: Acute onset of right sided weakness. Overall presentation is most consistent with a left hemispheric stroke in the MCA territory, versus a lacunar infarction in the basal ganglia, thalamus or pons. Most likely etiology is atrial fibrillation, despite being on anticoagulation.   RECOMMENDATIONS  - Continue Eliquis . Will need to be administered her evening dose, which she had not yet taken at home prior to onset of stroke symptoms.  - Modified permissive HTN protocol given that she is anticoagulated. Treat SBP if > 180 - IVF - Continue her atorvastatin .  - MRI brain - HgbA1c, fasting lipid panel - PT consult, OT consult, Speech consult - TTE  - Risk factor modification - Telemetry monitoring - Frequent neuro checks - NPO until passes stroke swallow screen  ______________________________________________________________________    Bonney SHARK, Dallis Darden, MD [facility]

## 2023-10-25 NOTE — ED Triage Notes (Signed)
 Pt BIB [facility] from home. Pt had sudden onset of R arm and leg weakness, and unable to ambulate; LSN 2100. Pt does take eliquis  BID, has not taken HS dose. A/ox4

## 2023-10-25 NOTE — Code Documentation (Signed)
 Stroke Response Nurse Documentation Code Documentation  Holly Henry is a 68 y.o. female arriving to [facility]  via [facility] on 10/25/23 with past medical hx of AF, HTN, HLD, TIA, arthritis, and fibromyalgia. On Eliquis  (apixaban ) daily. Code stroke was activated by EMS.   Patient from home where she was LKW at 2100 and now complaining of Right sided weakness. Pt was at home playing with her grandchildren when she noticed her right side was weaker than her left. EMS was called and she was unable to lift her RUE.   Stroke team at the bedside on patient arrival. Labs drawn and patient cleared for CT. Patient to CT with team. NIHSS 4, see documentation for details and code stroke times. The following imaging was completed:  CT Head and CTA. Patient is not a candidate for IV Thrombolytic due to on Eliquis . Patient is not a candidate for IR due to no LVO.   Care Plan: q2h vitals and neuro checks.    Bedside handoff with ED RN.   Nat Blunt  Stroke Response RN

## 2023-10-26 ENCOUNTER — Other Ambulatory Visit (HOSPITAL_COMMUNITY): Payer: Self-pay

## 2023-10-26 ENCOUNTER — Observation Stay (HOSPITAL_COMMUNITY)

## 2023-10-26 ENCOUNTER — Telehealth (HOSPITAL_COMMUNITY): Payer: Self-pay | Admitting: Pharmacy Technician

## 2023-10-26 DIAGNOSIS — F411 Generalized anxiety disorder: Secondary | ICD-10-CM | POA: Diagnosis not present

## 2023-10-26 DIAGNOSIS — G43019 Migraine without aura, intractable, without status migrainosus: Secondary | ICD-10-CM | POA: Diagnosis not present

## 2023-10-26 DIAGNOSIS — M069 Rheumatoid arthritis, unspecified: Secondary | ICD-10-CM | POA: Diagnosis present

## 2023-10-26 DIAGNOSIS — I63412 Cerebral infarction due to embolism of left middle cerebral artery: Secondary | ICD-10-CM | POA: Diagnosis present

## 2023-10-26 DIAGNOSIS — F331 Major depressive disorder, recurrent, moderate: Secondary | ICD-10-CM | POA: Diagnosis not present

## 2023-10-26 DIAGNOSIS — M0520 Rheumatoid vasculitis with rheumatoid arthritis of unspecified site: Secondary | ICD-10-CM | POA: Diagnosis not present

## 2023-10-26 DIAGNOSIS — F32A Depression, unspecified: Secondary | ICD-10-CM | POA: Diagnosis present

## 2023-10-26 DIAGNOSIS — R2990 Unspecified symptoms and signs involving the nervous system: Secondary | ICD-10-CM | POA: Diagnosis present

## 2023-10-26 DIAGNOSIS — Z79899 Other long term (current) drug therapy: Secondary | ICD-10-CM | POA: Diagnosis not present

## 2023-10-26 DIAGNOSIS — F419 Anxiety disorder, unspecified: Secondary | ICD-10-CM | POA: Diagnosis present

## 2023-10-26 DIAGNOSIS — I639 Cerebral infarction, unspecified: Secondary | ICD-10-CM | POA: Diagnosis present

## 2023-10-26 DIAGNOSIS — E782 Mixed hyperlipidemia: Secondary | ICD-10-CM | POA: Diagnosis not present

## 2023-10-26 DIAGNOSIS — G894 Chronic pain syndrome: Secondary | ICD-10-CM | POA: Diagnosis not present

## 2023-10-26 DIAGNOSIS — R7989 Other specified abnormal findings of blood chemistry: Secondary | ICD-10-CM | POA: Diagnosis not present

## 2023-10-26 DIAGNOSIS — Z7901 Long term (current) use of anticoagulants: Secondary | ICD-10-CM | POA: Diagnosis not present

## 2023-10-26 DIAGNOSIS — K5900 Constipation, unspecified: Secondary | ICD-10-CM | POA: Diagnosis present

## 2023-10-26 DIAGNOSIS — I4891 Unspecified atrial fibrillation: Secondary | ICD-10-CM | POA: Diagnosis not present

## 2023-10-26 DIAGNOSIS — I6522 Occlusion and stenosis of left carotid artery: Secondary | ICD-10-CM | POA: Diagnosis present

## 2023-10-26 DIAGNOSIS — I480 Paroxysmal atrial fibrillation: Secondary | ICD-10-CM | POA: Diagnosis present

## 2023-10-26 DIAGNOSIS — I6340 Cerebral infarction due to embolism of unspecified cerebral artery: Secondary | ICD-10-CM | POA: Diagnosis not present

## 2023-10-26 DIAGNOSIS — K219 Gastro-esophageal reflux disease without esophagitis: Secondary | ICD-10-CM | POA: Diagnosis present

## 2023-10-26 DIAGNOSIS — R7303 Prediabetes: Secondary | ICD-10-CM | POA: Diagnosis present

## 2023-10-26 DIAGNOSIS — E669 Obesity, unspecified: Secondary | ICD-10-CM | POA: Diagnosis present

## 2023-10-26 DIAGNOSIS — Z888 Allergy status to other drugs, medicaments and biological substances status: Secondary | ICD-10-CM | POA: Diagnosis not present

## 2023-10-26 DIAGNOSIS — M797 Fibromyalgia: Secondary | ICD-10-CM | POA: Diagnosis present

## 2023-10-26 DIAGNOSIS — M1990 Unspecified osteoarthritis, unspecified site: Secondary | ICD-10-CM | POA: Diagnosis present

## 2023-10-26 DIAGNOSIS — I700 Atherosclerosis of aorta: Secondary | ICD-10-CM | POA: Diagnosis present

## 2023-10-26 DIAGNOSIS — R29703 NIHSS score 3: Secondary | ICD-10-CM | POA: Diagnosis not present

## 2023-10-26 DIAGNOSIS — I482 Chronic atrial fibrillation, unspecified: Secondary | ICD-10-CM | POA: Diagnosis not present

## 2023-10-26 DIAGNOSIS — Z6831 Body mass index (BMI) 31.0-31.9, adult: Secondary | ICD-10-CM | POA: Diagnosis not present

## 2023-10-26 DIAGNOSIS — D649 Anemia, unspecified: Secondary | ICD-10-CM | POA: Diagnosis not present

## 2023-10-26 DIAGNOSIS — I6381 Other cerebral infarction due to occlusion or stenosis of small artery: Secondary | ICD-10-CM | POA: Diagnosis not present

## 2023-10-26 DIAGNOSIS — E7800 Pure hypercholesterolemia, unspecified: Secondary | ICD-10-CM | POA: Diagnosis present

## 2023-10-26 DIAGNOSIS — R29704 NIHSS score 4: Secondary | ICD-10-CM | POA: Diagnosis present

## 2023-10-26 DIAGNOSIS — I739 Peripheral vascular disease, unspecified: Secondary | ICD-10-CM | POA: Diagnosis not present

## 2023-10-26 DIAGNOSIS — G8191 Hemiplegia, unspecified affecting right dominant side: Secondary | ICD-10-CM | POA: Diagnosis present

## 2023-10-26 DIAGNOSIS — Z7982 Long term (current) use of aspirin: Secondary | ICD-10-CM | POA: Diagnosis not present

## 2023-10-26 DIAGNOSIS — I10 Essential (primary) hypertension: Secondary | ICD-10-CM | POA: Diagnosis present

## 2023-10-26 DIAGNOSIS — N3281 Overactive bladder: Secondary | ICD-10-CM | POA: Diagnosis present

## 2023-10-26 LAB — ECHOCARDIOGRAM COMPLETE
Area-P 1/2: 3.53 cm2
Height: 61 in
S' Lateral: 2.5 cm
Weight: 2640 [oz_av]

## 2023-10-26 LAB — HEMOGLOBIN A1C
Hgb A1c MFr Bld: 6 % — ABNORMAL HIGH (ref 4.8–5.6)
Mean Plasma Glucose: 126 mg/dL

## 2023-10-26 LAB — LIPID PANEL
Cholesterol: 184 mg/dL (ref 0–200)
HDL: 38 mg/dL — ABNORMAL LOW (ref >40)
LDL Cholesterol: 112 mg/dL — ABNORMAL HIGH (ref 0–99)
Total CHOL/HDL Ratio: 4.8 ratio
Triglycerides: 171 mg/dL — ABNORMAL HIGH (ref <150)
VLDL: 34 mg/dL (ref 0–40)

## 2023-10-26 LAB — RAPID URINE DRUG SCREEN, HOSP PERFORMED
Amphetamines: NOT DETECTED
Barbiturates: NOT DETECTED
Benzodiazepines: NOT DETECTED
Cocaine: NOT DETECTED
Opiates: NOT DETECTED
Tetrahydrocannabinol: NOT DETECTED

## 2023-10-26 LAB — HIV ANTIBODY (ROUTINE TESTING W REFLEX): HIV Screen 4th Generation wRfx: NONREACTIVE

## 2023-10-26 MED ORDER — ACETAMINOPHEN 325 MG PO TABS
650.0000 mg | ORAL_TABLET | Freq: Four times a day (QID) | ORAL | Status: DC | PRN
  Administered 2023-10-26 – 2023-10-28 (×2): 650 mg via ORAL
  Filled 2023-10-26 (×2): qty 2

## 2023-10-26 MED ORDER — POLYETHYLENE GLYCOL 3350 17 G PO PACK
17.0000 g | PACK | Freq: Every day | ORAL | Status: DC | PRN
  Administered 2023-10-28: 17 g via ORAL
  Filled 2023-10-26: qty 1

## 2023-10-26 MED ORDER — EZETIMIBE 10 MG PO TABS
10.0000 mg | ORAL_TABLET | Freq: Every day | ORAL | Status: DC
  Administered 2023-10-26 – 2023-10-28 (×3): 10 mg via ORAL
  Filled 2023-10-26 (×3): qty 1

## 2023-10-26 MED ORDER — PANTOPRAZOLE SODIUM 40 MG PO TBEC
40.0000 mg | DELAYED_RELEASE_TABLET | Freq: Every morning | ORAL | Status: DC
  Administered 2023-10-26 – 2023-10-28 (×3): 40 mg via ORAL
  Filled 2023-10-26 (×3): qty 1

## 2023-10-26 MED ORDER — DABIGATRAN ETEXILATE MESYLATE 150 MG PO CAPS
150.0000 mg | ORAL_CAPSULE | Freq: Two times a day (BID) | ORAL | Status: DC
  Administered 2023-10-26 – 2023-10-28 (×4): 150 mg via ORAL
  Filled 2023-10-26 (×6): qty 1

## 2023-10-26 MED ORDER — HYDROMORPHONE HCL 1 MG/ML IJ SOLN
0.5000 mg | INTRAMUSCULAR | Status: AC
  Administered 2023-10-26: 0.5 mg via INTRAVENOUS
  Filled 2023-10-26: qty 1

## 2023-10-26 MED ORDER — DULOXETINE HCL 60 MG PO CPEP
90.0000 mg | ORAL_CAPSULE | Freq: Every day | ORAL | Status: DC
  Administered 2023-10-26 – 2023-10-28 (×3): 90 mg via ORAL
  Filled 2023-10-26 (×2): qty 1
  Filled 2023-10-26: qty 3

## 2023-10-26 MED ORDER — MELATONIN 5 MG PO TABS: 5.0000 mg | ORAL_TABLET | Freq: Every evening | ORAL | Status: DC | PRN

## 2023-10-26 MED ORDER — PROCHLORPERAZINE EDISYLATE 10 MG/2ML IJ SOLN: 5.0000 mg | Freq: Four times a day (QID) | INTRAMUSCULAR | Status: DC | PRN

## 2023-10-26 MED ORDER — OXYCODONE HCL 5 MG PO TABS
5.0000 mg | ORAL_TABLET | ORAL | Status: DC | PRN
  Administered 2023-10-26 – 2023-10-28 (×6): 5 mg via ORAL
  Filled 2023-10-26 (×6): qty 1

## 2023-10-26 MED ORDER — LACTATED RINGERS IV SOLN: INTRAVENOUS | Status: AC

## 2023-10-26 MED ORDER — ACETAMINOPHEN 325 MG PO TABS
650.0000 mg | ORAL_TABLET | Freq: Once | ORAL | Status: AC
  Administered 2023-10-26: 650 mg via ORAL
  Filled 2023-10-26: qty 2

## 2023-10-26 MED ORDER — BUSPIRONE HCL 10 MG PO TABS
15.0000 mg | ORAL_TABLET | Freq: Two times a day (BID) | ORAL | Status: DC
  Administered 2023-10-26 – 2023-10-28 (×5): 15 mg via ORAL
  Filled 2023-10-26 (×5): qty 2

## 2023-10-26 NOTE — Progress Notes (Addendum)
 Courtesy note- No billing  Patient is seen and examined today morning in emergency department room 36.  She is admitted for evaluation of acute right-sided weakness.  Patient does have history of paroxysmal A-fib on Eliquis , hypertension, hyperlipidemia, rheumatoid arthritis, GERD, IBS, chronic anxiety depression, prior CVA. Patient is admitted to the TRH service for stroke workup.  I reviewed MRI brain which did show acute nonhemorrhagic infarct left precentral gyrus, small lacunar white matter infarct involving left temporal occipital junction and left parietal lobe. She has right arm weakness, unable to lift it, able to move fingers. Will follow neurology recommendations.  Continue Eliquis , statin.  She is able to swallow good.  PT OT evaluation advised rehab.  Further management pending full stroke workup.

## 2023-10-26 NOTE — Progress Notes (Signed)
 PHARMACY - ANTICOAGULATION CONSULT NOTE  Pharmacy Consult for Pradaxa  Indication: atrial fibrillation  Allergies  Allergen Reactions   Gramineae Pollens Dermatitis    Dust, pollen  redtop grass pollen extract   [place] Grass Extract Cough   Buprenorphine Hcl Other (See Comments)    Other Reaction(s): Mental Status Changes, Psychosis   Pollen Extract Dermatitis    Dust, pollen    Patient Measurements: Height: 5' 1 (154.9 cm) Weight: 74.8 kg (165 lb) IBW/kg (Calculated) : 47.8 HEPARIN DW (KG): 64.3  Vital Signs: Temp: 97.2 F (36.2 C) (07/14 0749) Temp Source: Oral (07/14 0324) BP: 176/72 (07/14 0749) Pulse Rate: 64 (07/14 0749)  Labs: Recent Labs    10/25/23 2215 10/25/23 2217  HGB 12.0 12.2  HCT 37.4 36.0  PLT 254  --   APTT 26  --   LABPROT 17.1*  --   INR 1.3*  --   CREATININE 0.92 1.00    Estimated Creatinine Clearance: 49.1 mL/min (by C-G formula based on SCr of 1 mg/dL).   Medical History: Past Medical History:  Diagnosis Date   A-fib (HCC)    Fibromyalgia    High cholesterol    Hypertension    Osteoarthritis    Prediabetes    Rheumatoid arthritis (HCC)      Assessment: 29 yof with new atrial fibrillation. Patient was started on Eliquis  overnight and received one dose this morning. Neurology has asked pharmacy to transition from Eliquis  to Pradaxa . CrCl ~49, will schedule first dose tonight ~12 hours post Eliquis  dose.    Goal of Therapy:  Monitor platelets by anticoagulation protocol: Yes   Plan:  Pradaxa  150mg  BID starting tonight @ 2200 Monitor CBC/renal function and for s/sx of bleeding  Donnald Tabar L Tonyetta Berko 10/26/2023,10:53 AM

## 2023-10-26 NOTE — Progress Notes (Signed)

## 2023-10-26 NOTE — Progress Notes (Signed)
 Physical Therapy Evaluation Patient Details Name: Holly Henry MRN: [id number] DOB: 04/22/1955 Today's Date: 10/26/2023  History of Present Illness  Pt is a 68 y.o. female presenting of R sided weakness. CTH negative for acute findings. 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  PMH significant for P A-fib, HTN, HLD, RA, GERD, IBS, anxiety/depression, prior CVA.  Clinical Impression  Pt with limited evaluation completed as pt was taken to radiology. Pt with rt sided weakness and decr balance. Will see later today to assess further mobility and make dc recommendations.         If plan is discharge home, recommend the following:     Can travel by private vehicle        Equipment Recommendations Other (comment) (TBD)  Recommendations for Other Services       Functional Status Assessment       Precautions / Restrictions Precautions Precautions: Fall;Other (comment) Recall of Precautions/Restrictions: Intact Precaution/Restrictions Comments: watch bp      Mobility  Bed Mobility Overal bed mobility: Needs Assistance Bed Mobility: Supine to Sit, Sit to Supine     Supine to sit: Min assist, HOB elevated Sit to supine: Min assist, HOB elevated   General bed mobility comments: Assist to elevate trunk into sitting. Assist to bring legs back up into bed.    Transfers                   General transfer comment: NT due to pt taken to radiology    Ambulation/Gait                  Stairs            Wheelchair Mobility     Tilt Bed    Modified Rankin (Stroke Patients Only)       Balance Overall balance assessment: Needs assistance Sitting-balance support: Single extremity supported, Feet unsupported, No upper extremity supported Sitting balance-Leahy Scale: Fair                                       Pertinent Vitals/Pain Pain Assessment Pain Assessment: Faces Faces Pain Scale:  Hurts a little bit Pain Location: generalized Pain Descriptors / Indicators: Grimacing Pain Intervention(s): Limited activity within patient's tolerance, Repositioned, Monitored during session    Home Living Family/patient expects to be discharged to:: Private residence Living Arrangements: Children;Other (Comment) (daughter and grandchildren) Available Help at Discharge: Family Type of Home: House Home Access: Stairs to enter Entrance Stairs-Rails: None Entrance Stairs-Number of Steps: 1 Alternate Level Stairs-Number of Steps: flight Home Layout: Two level;Laundry or work area in basement;Able to live on main level with bedroom/bathroom Home Equipment: Hand held shower head;Grab bars - tub/shower;Grab bars - toilet Additional Comments: May have a w/c available    Prior Function Prior Level of Function : Independent/Modified Independent;History of Falls (last six months)             Mobility Comments: No assistive device. 2 falls in last month       Extremity/Trunk Assessment   Upper Extremity Assessment Upper Extremity Assessment: Defer to OT evaluation    Lower Extremity Assessment Lower Extremity Assessment: RLE deficits/detail RLE Deficits / Details: grossly 4/5       Communication   Communication Communication: No apparent difficulties    Cognition Arousal: Alert Behavior During Therapy: [facility] for tasks assessed/performed  PT - Cognitive impairments: No apparent impairments                         Following commands: Intact       Cueing Cueing Techniques: Verbal cues     General Comments General comments (skin integrity, edema, etc.): BP 120's/100's    Exercises     Assessment/Plan    PT Assessment Patient needs continued PT services  PT Problem List Decreased strength;Decreased balance;Decreased mobility       PT Treatment Interventions DME instruction;Gait training;Functional mobility training;Stair training;Therapeutic  activities;Therapeutic exercise;Balance training;Patient/family education;Neuromuscular re-education    PT Goals (Current goals can be found in the Care Plan section)       Frequency Min 2X/week     Co-evaluation               AM-PAC PT 6 Clicks Mobility  Outcome Measure                  End of Session   Activity Tolerance: Patient tolerated treatment well Patient left:  (Going to radiology)   PT Visit Diagnosis: Other abnormalities of gait and mobility (R26.89);Hemiplegia and hemiparesis;Muscle weakness (generalized) (M62.81);History of falling (Z91.81)    Time: 8961-8951 PT Time Calculation (min) (ACUTE ONLY): 10 min   Charges:   PT Evaluation $PT Eval Low Complexity: 1 Low   PT General Charges $$ ACUTE PT VISIT: 1 Visit         [facility] PT Acute Rehabilitation Services Office [phone]   Rodgers ORN [facility] 10/26/2023, 10:56 AM

## 2023-10-26 NOTE — Telephone Encounter (Signed)
 Patient Product/process development scientist completed.    The patient is insured through [employer]. Patient has Medicare and is not eligible for a copay card, but may be able to apply for patient assistance or Medicare RX Payment Plan (Patient Must reach out to their plan, if eligible for payment plan), if available.    Ran test claim for Eliquis  5 mg and the current 30 day co-pay is $0.00.  Ran test claim for Xarelto 20 mg and the current 30 day co-pay is $0.00.  Ran test claim for dabigatran  (Pradaxa ) 150 mg and the current 30 day co-pay is $0.00.  This test claim was processed through [facility]- copay amounts may vary at other pharmacies due to pharmacy/plan contracts, or as the patient moves through the different stages of their insurance plan.     Reyes Sharps, CPHT Pharmacy Technician III Certified Patient Advocate [facility] Patient Advocate Team Direct Number: [phone]  Fax: [phone]

## 2023-10-26 NOTE — Progress Notes (Signed)
 Physical Therapy Treatment Patient Details Name: Holly Henry MRN: [id number] DOB: [birth year] Today's Date: 10/26/2023   History of Present Illness Pt is a 68 y.o. female presenting of R sided weakness. CTH negative for acute findings. 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 MRI showed multiple lt cerebral infarcts. PMH significant for P A-fib, HTN, HLD, RA, GERD, IBS, anxiety/depression, prior CVA.    PT Comments  Pt seen again after return from radiology. Able to stand with assist on a raised step with noted weakness and decr balance. Did not attempt ambulation due to high stretcher and pt's short stature make getting back on the stretcher problematic. Pt has noted improvement in balance and LUE function since last night. Pt very motivated to return to independence. Patient will benefit from intensive inpatient follow-up therapy, >3 hours/day.    If plan is discharge home, recommend the following:     Can travel by private vehicle        Equipment Recommendations  Other (comment) (TBD)    Recommendations for Other Services Rehab consult     Precautions / Restrictions Precautions Precautions: Fall;Other (comment) Recall of Precautions/Restrictions: Intact Precaution/Restrictions Comments: watch bp Restrictions Weight Bearing Restrictions Per Provider Order: No     Mobility  Bed Mobility Overal bed mobility: Needs Assistance Bed Mobility: Sit to Supine     Supine to sit: Min assist, HOB elevated Sit to supine: Min assist, HOB elevated   General bed mobility comments: Assist to guide trunk and legs into bed.    Transfers Overall transfer level: Needs assistance Equipment used: 1 person hand held assist (Holding rail in other hand) Transfers: Sit to/from Stand Sit to Stand: Mod assist, From elevated surface           General transfer comment: Used step stool with hand rail due to height of stretcher and pt short stature.  Assist to power up and stabilize. Rt knee slightly flexed.    Ambulation/Gait               General Gait Details: Not attempted due to height of stretcher and pt short stature.   Stairs             Wheelchair Mobility     Tilt Bed    Modified Rankin (Stroke Patients Only)       Balance Overall balance assessment: Needs assistance Sitting-balance support: Single extremity supported, Feet unsupported, No upper extremity supported Sitting balance-Leahy Scale: Fair     Standing balance support: Single extremity supported, Bilateral upper extremity supported Standing balance-Leahy Scale: Poor Standing balance comment: UE support and min assist for static standing                            Communication Communication Communication: No apparent difficulties  Cognition Arousal: Alert Behavior During Therapy: WFL for tasks assessed/performed   PT - Cognitive impairments: No apparent impairments                         Following commands: Intact      Cueing Cueing Techniques: Verbal cues  Exercises      General Comments General comments (skin integrity, edema, etc.): BP 120's/100's      Pertinent Vitals/Pain Pain Assessment Pain Assessment: Faces Faces Pain Scale: Hurts a little bit Pain Location: generalized Pain Descriptors / Indicators: Grimacing Pain Intervention(s): Limited activity within patient's tolerance  Home Living Family/patient expects to be discharged to:: Private residence Living Arrangements: Children;Other (Comment) (daughter and grandchildren) Available Help at Discharge: Family Type of Home: House Home Access: Stairs to enter Entrance Stairs-Rails: None Entrance Stairs-Number of Steps: 1 Alternate Level Stairs-Number of Steps: flight Home Layout: Two level;Laundry or work area in basement;Able to live on main level with bedroom/bathroom Home Equipment: Hand held shower head;Grab bars - tub/shower;Grab  bars - toilet Additional Comments: May have a w/c available    Prior Function            PT Goals (current goals can now be found in the care plan section) Progress towards PT goals: Progressing toward goals    Frequency    Min 2X/week      PT Plan      Co-evaluation              AM-PAC PT 6 Clicks Mobility   Outcome Measure  Help needed turning from your back to your side while in a flat bed without using bedrails?: A Little Help needed moving from lying on your back to sitting on the side of a flat bed without using bedrails?: A Little Help needed moving to and from a bed to a chair (including a wheelchair)?: A Lot Help needed standing up from a chair using your arms (e.g., wheelchair or bedside chair)?: A Lot Help needed to walk in hospital room?: Total Help needed climbing 3-5 steps with a railing? : Total 6 Click Score: 12    End of Session Equipment Utilized During Treatment: Gait belt Activity Tolerance: Patient tolerated treatment well Patient left: in bed;with call bell/phone within reach (stretcher) Nurse Communication: Mobility status PT Visit Diagnosis: Other abnormalities of gait and mobility (R26.89);Hemiplegia and hemiparesis;Muscle weakness (generalized) (M62.81);History of falling (Z91.81) Hemiplegia - Right/Left: Right Hemiplegia - dominant/non-dominant: Dominant Hemiplegia - caused by: Cerebral infarction     Time: 1155-1210 PT Time Calculation (min) (ACUTE ONLY): 15 min  Charges:    $Therapeutic Activity: 8-22 mins PT General Charges $$ ACUTE PT VISIT: 1 Visit                     [facility] PT Acute Rehabilitation Services Office [phone]    Rodgers ORN [facility] 10/26/2023, 1:15 PM

## 2023-10-26 NOTE — H&P (Addendum)
 History and Physical  Holly Henry FMW:[id number] DOB: March 06, 1956 DOA: 10/25/2023  Referring physician: Dr. Raford, EDP  PCP: Evangelina Tinnie Norris, PA-C  Outpatient Specialists: Cardiology. Patient coming from: Home.  Chief Complaint: Right arm and leg weakness.  HPI: Holly Henry is a 68 y.o. female with medical history significant for paroxysmal A-fib on Eliquis , hypertension, hyperlipidemia, rheumatoid arthritis, GERD, IBS, chronic anxiety/depression, prior CVA, who presented to the ER from home via EMS with complaints of sudden onset of right arm and leg weakness.  Endorses compliance with her home medications including DOAC.  Last known well 2100.  She was at home with her grandchildren when she noticed her right side was weaker than her left side.  EMS was activated and code stroke was called by EMS for acute onset right-sided weakness.    In the ER, seen by neurology/stroke team.  Noncontrast head CT nonacute.  CT angio head and neck with no evidence of LVO.  Admitted by [facility], hospitalist service, for stroke workup.  ED Course: Temperature 98.4.  BP 150/118, pulse 67, respiratory 17, O2 saturation 94% on room air.  Review of Systems: Review of systems as noted in the HPI. All other systems reviewed and are negative.   Past Medical History:  Diagnosis Date   A-fib (HCC)    Fibromyalgia    High cholesterol    Hypertension    Osteoarthritis    Prediabetes    Rheumatoid arthritis (HCC)    Past Surgical History:  Procedure Laterality Date   BACK SURGERY     CATARACT EXTRACTION Bilateral    TONSILLECTOMY      Social History:  reports that she has never smoked. She has never used smokeless tobacco. She reports that she does not drink alcohol and does not use drugs.   Allergies  Allergen Reactions   Gramineae Pollens Dermatitis    Dust, pollen  redtop grass pollen extract   [place] Grass Extract Cough   Buprenorphine Hcl Other (See Comments)    Other Reaction(s):  Mental Status Changes, Psychosis   Pollen Extract Dermatitis    Dust, pollen   Family history: None reported  Prior to Admission medications   Medication Sig Start Date End Date Taking? Authorizing Provider  acetaminophen  (TYLENOL ) 500 MG tablet Take 1,000 mg by mouth every 6 (six) hours as needed for mild pain (pain score 1-3) or headache.   Yes [provider]  apixaban  (ELIQUIS ) 5 MG TABS tablet Take 5 mg by mouth 2 (two) times daily.   Yes [provider]  aspirin EC 81 MG tablet Take 81 mg by mouth at bedtime.   Yes [provider]  atorvastatin  (LIPITOR ) 80 MG tablet Take 80 mg by mouth at bedtime. 07/13/20  Yes [provider]  BELSOMRA 5 MG TABS Take 1 tablet by mouth at bedtime as needed. 07/22/23  Yes [provider]  busPIRone  (BUSPAR ) 15 MG tablet Take 15 mg by mouth 2 (two) times daily. 10/09/23  Yes [provider]  DULoxetine  (CYMBALTA ) 30 MG capsule Take 90 mg by mouth daily. 09/11/23  Yes [provider]  gabapentin  (NEURONTIN ) 300 MG capsule Take 300 mg by mouth 3 (three) times daily. 10/22/23  Yes [provider]  LUMIGAN 0.01 % SOLN 1 drop. 10/20/23  Yes [provider]  methenamine  (HIPREX) 1 g tablet Take 1 g by mouth 2 (two) times daily. 09/11/23  Yes [provider]  ORENCIA CLICKJECT 125 MG/ML SOAJ Inject 125 mg into the skin. 12/26/21  Yes [provider]  REPATHA SURECLICK 140 MG/ML SOAJ Inject 140 mg into the skin. 10/07/23  Yes [provider]  tiZANidine (ZANAFLEX) 2 MG tablet Take by mouth. 10/20/23  Yes [provider]  busPIRone  (BUSPAR ) 30 MG tablet Take 30 mg by mouth 2 (two) times daily. 09/18/20   [provider]  cephALEXin  (KEFLEX ) 500 MG capsule Take 1 capsule (500 mg total) by mouth 3 (three) times daily. 05/18/22   Redwine, Madison A, PA-C  COVID-19 mRNA bivalent vaccine, Pfizer, (PFIZER COVID-19 VAC BIVALENT) injection Inject into the  muscle. Patient not taking: Reported on 06/29/2021 02/28/21   Luiz Channel, MD  diclofenac Sodium (VOLTAREN) 1 % GEL Apply 1 application topically 2 (two) times daily as needed (arthritis pain).    [provider]  ezetimibe  (ZETIA ) 10 MG tablet Take 10 mg by mouth in the morning. 07/28/20   [provider]  flecainide (TAMBOCOR) 50 MG tablet Take 50 mg by mouth 2 (two) times daily.    [provider]  folic acid (FOLVITE) 1 MG tablet Take 1 mg by mouth at bedtime. 09/27/20   [provider]  furosemide  (LASIX ) 20 MG tablet Take 1 tablet (20 mg total) by mouth daily. Patient taking differently: Take 20 mg by mouth daily as needed for fluid or edema (feet swelling). 06/21/20   Horton, Charmaine FALCON, MD  gabapentin  (NEURONTIN ) 600 MG tablet Take 600 mg by mouth 3 (three) times daily. Patient not taking: Reported on 06/29/2021 09/22/20   [provider]  hydrochlorothiazide (HYDRODIURIL) 25 MG tablet Take 25 mg by mouth in the morning. 07/13/20   [provider]  hydroxychloroquine (PLAQUENIL) 200 MG tablet Take 200 mg by mouth daily.    [provider]  hydrOXYzine (ATARAX) 50 MG tablet Take 50 mg by mouth at bedtime.    [provider]  influenza vaccine adjuvanted (FLUAD) 0.5 ML injection Inject into the muscle. Patient not taking: Reported on 06/29/2021 02/28/21   Luiz Channel, MD  latanoprost (XALATAN) 0.005 % ophthalmic solution Place 1 drop into both eyes at bedtime. 08/10/20   [provider]  loratadine (CLARITIN) 10 MG tablet Take 10 mg by mouth every morning. 05/02/20   [provider]  methotrexate (RHEUMATREX) 2.5 MG tablet Take 15 mg by mouth every Thursday. Patient not taking: Reported on 06/29/2021 05/28/20   [provider]  methotrexate 250 MG/10ML injection Inject 15 mg into the skin every Wednesday. 06/19/21   [provider]  metoprolol tartrate (LOPRESSOR) 25 MG tablet Take 25 mg by  mouth 2 (two) times daily. 09/12/20   [provider]  montelukast (SINGULAIR) 10 MG tablet Take 10 mg by mouth daily. 07/17/20   [provider]  morphine (MS CONTIN) 15 MG 12 hr tablet Take 15 mg by mouth every 12 (twelve) hours.    [provider]  Multiple Vitamin (MULTIVITAMIN WITH MINERALS) TABS tablet Take 1 tablet by mouth daily.    [provider]  naproxen sodium (ALEVE) 220 MG tablet Take 440 mg by mouth daily as needed (pain).    [provider]  nystatin-triamcinolone ointment (MYCOLOG) Apply 1 application topically See admin instructions. Apply topically to perineal area up to three times daily as needed for rash/itching. 02/20/20   [provider]  ondansetron  (ZOFRAN -ODT) 4 MG disintegrating tablet Take 1 tablet (4 mg total) by mouth every 8 (eight) hours as needed for nausea or vomiting. 02/22/22   Dreama Longs, MD  oxyCODONE  (OXY  IR/ROXICODONE ) 5 MG immediate release tablet Take 5 mg by mouth 2 (two) times daily as needed for breakthrough pain. 09/21/20   [provider]  oxyCODONE -acetaminophen  (PERCOCET/ROXICET) 5-325 MG tablet Take 1 tablet by mouth every 6 (six) hours as needed for severe pain. 05/18/22   Redwine, Madison A, PA-C  pantoprazole  (PROTONIX ) 40 MG tablet Take 40 mg by mouth every morning. 09/10/20   [provider]  pregabalin (LYRICA) 25 MG capsule Take 25 mg by mouth 2 (two) times daily.    [provider]    Physical Exam: BP (!) 164/90   Pulse 64   Temp 98.1 F (36.7 C) (Oral)   Resp (!) 21   Ht 5' 1 (1.549 m)   Wt 74.8 kg   SpO2 98%   BMI 31.18 kg/m   General: 68 y.o. year-old female well developed well nourished in no acute distress.  Alert and oriented x3. Cardiovascular: Regular rate and rhythm with no rubs or gallops.  No thyromegaly or JVD noted.  No lower extremity edema. 2/4 pulses in all 4 extremities. Respiratory: Clear to auscultation with no wheezes or rales. Good  inspiratory effort. Abdomen: Soft nontender nondistended with normal bowel sounds x4 quadrants. Muskuloskeletal: No cyanosis, clubbing or edema noted bilaterally Neuro: CN II-XII intact, sensation, reflexes.  3 out of 5 strength in right upper extremity. Skin: No ulcerative lesions noted or rashes Psychiatry: Judgement and insight appear normal. Mood is appropriate for condition and setting          Labs on Admission:  Basic Metabolic Panel: Recent Labs  Lab 10/25/23 2215 10/25/23 2217  NA 139 141  K 4.0 4.0  CL 105 107  CO2 24  --   GLUCOSE 93 88  BUN 17 18  CREATININE 0.92 1.00  CALCIUM  8.9  --    Liver Function Tests: Recent Labs  Lab 10/25/23 2215  AST 14*  ALT 18  ALKPHOS 81  BILITOT 1.0  PROT 6.4*  ALBUMIN 3.6   No results for input(s): LIPASE, AMYLASE in the last 168 hours. No results for input(s): AMMONIA in the last 168 hours. CBC: Recent Labs  Lab 10/25/23 2215 10/25/23 2217  WBC 7.6  --   NEUTROABS 5.0  --   HGB 12.0 12.2  HCT 37.4 36.0  MCV 94.4  --   PLT 254  --    Cardiac Enzymes: No results for input(s): CKTOTAL, CKMB, CKMBINDEX, TROPONINI in the last 168 hours.  BNP (last 3 results) No results for input(s): BNP in the last 8760 hours.  ProBNP (last 3 results) No results for input(s): PROBNP in the last 8760 hours.  CBG: Recent Labs  Lab 10/25/23 2213  GLUCAP 96    Radiological Exams on Admission: CT ANGIO HEAD NECK W WO CM (CODE STROKE) Result Date: 10/25/2023 CLINICAL DATA:  Neuro deficit, acute, stroke suspected EXAM: CT ANGIOGRAPHY HEAD AND NECK WITH AND WITHOUT CONTRAST TECHNIQUE: Multidetector CT imaging of the head and neck was performed using the standard protocol during bolus administration of intravenous contrast. Multiplanar CT image reconstructions and MIPs were obtained to evaluate the vascular anatomy. Carotid stenosis measurements (when applicable) are obtained utilizing NASCET criteria, using the  distal internal carotid diameter as the denominator. RADIATION DOSE REDUCTION: This exam was performed according to the departmental dose-optimization program which includes automated exposure control, adjustment of the mA and/or kV according to patient size and/or use of iterative reconstruction technique. CONTRAST:  75mL OMNIPAQUE  IOHEXOL  350 MG/ML SOLN COMPARISON:  None  Available. FINDINGS: CTA NECK FINDINGS Aortic arch: Great vessel origins are patent. Aortic atherosclerosis. Right carotid system: No evidence of dissection, stenosis (50% or greater), or occlusion. Left carotid system: Approximately 50% stenosis of the left proximal ICA due to atherosclerosis. Vertebral arteries: Severe left vertebral artery origin stenosis. Patent bilaterally. Right dominant. Skeleton: No evidence of acute abnormality on limited assessment. Other neck: No evidence of acute abnormality on limited assessment. Upper chest: Visualized lung apices are clear. Review of the MIP images confirms the above findings CTA HEAD FINDINGS Anterior circulation: Bilateral intracranial ICAs, MCAs, and are patent without proximal hemodynamically significant stenosis. Posterior circulation: Bilateral intradural vertebral arteries, basilar artery and bilateral posterior arteries are patent without proximal 8 image least significant stenosis. Venous sinuses: As permitted by contrast timing, patent. Review of the MIP images confirms the above findings IMPRESSION: 1. No emergent large vessel occlusion. 2. Severe left vertebral artery origin stenosis. 3. 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 4. Aortic Atherosclerosis (ICD10-I70.0). Electronically Signed   By: Gilmore GORMAN Molt M.D.   On: 10/25/2023 22:53   CT HEAD CODE STROKE WO CONTRAST Result Date: 10/25/2023 CLINICAL DATA:  Code stroke.  Neuro deficit, acute, stroke suspected EXAM: CT HEAD WITHOUT CONTRAST TECHNIQUE: Contiguous axial images were obtained from the base of the skull  through the vertex without intravenous contrast. RADIATION DOSE REDUCTION: This exam was performed according to the departmental dose-optimization program which includes automated exposure control, adjustment of the mA and/or kV according to patient size and/or use of iterative reconstruction technique. COMPARISON:  CT headMay 9, 2025. FINDINGS: Brain: Similar patchy hypodensities in the white matter and deep gray nuclei, compatible with chronic microvascular ischemic change. Small remote infarct in the left caudate superiorly. No evidence of acute large vascular territory infarct, acute hemorrhage, mass lesion or midline shift. Vascular: No hyperdense vessel. Skull: No acute fracture. Sinuses/Orbits: Clear sinuses.  No acute orbital findings. Other: No mastoid effusions. ASPECTS [facility] Stroke Program Early CT Score) Total score (0-10 with 10 being normal): 10. IMPRESSION: 1. No evidence of acute intracranial abnormality. ASPECTS is 10. 2. Chronic microvascular ischemic change. Code stroke imaging results were communicated on 10/25/2023 at 10:34 pm to provider Dr. Merrianne via secure text paging. Electronically Signed   By: Gilmore GORMAN Molt M.D.   On: 10/25/2023 22:35    EKG: I independently viewed the EKG done and my findings are as followed: Normal sinus rhythm rate of 65.  Nonspecific ST-T changes.  QTC 432.  Assessment/Plan Present on Admission:  Stroke-like symptoms  Principal Problem:   Stroke-like symptoms  Strokelike symptoms Noncontrast head CT and CT angio head and neck were nonacute Follow MRI brain Frequent neurochecks PT OT speech therapist evaluation Permissive hypertension 2D echo Rest of management per neurology/stroke team.  Hypertension Permissive hypertension Hold of home oriented x 2 Closely monitor vital sign Treat SBP: 220 or DBP greater than 120  Paroxysmal A-fib on Eliquis  Resume home Eliquis  Monitor on telemetry  Hyperlipidemia High intensity  Lipitor   Obesity BMI 31 Recommend weight loss outpatient  Chronic anxiety/depression Resume home regimen.  GERD Resume home PPI   Time: 75 minutes.   DVT prophylaxis: Eliquis   Code Status: Full code.  Family Communication: Daughter at bedside.  The patient's daughter request to be updated frequently.  Disposition Plan: Admitted to telemetry medical unit.  Consults called: Neurology/stroke team.  Admission status: Observation status   Status is: Observation    Terry LOISE Hurst MD [facility] Pager [phone]  If 7PM-7AM, please  contact night-coverage [email or web address] Password [email or web address]  10/26/2023, 2:43 AM

## 2023-10-26 NOTE — Evaluation (Signed)
 Occupational Therapy Evaluation Patient Details Name: Holly Henry MRN: [id number] DOB: 02/10/1956 Today's Date: 10/26/2023   History of Present Illness   Pt is a 68 y.o. female presenting of R sided weakness. CTH negative for acute findings. 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 MRI showed multiple lt cerebral infarcts. PMH significant for P A-fib, HTN, HLD, RA, GERD, IBS, anxiety/depression, prior CVA.     Clinical Impressions PTA, pt lived with daughter and reports being independent. Upon eval, pt presents with R sided weakness, decr coordination, balance, safety. Pt needing up to mod A for UB ADL and max A for LB ADL. Pt extremely motivated to return to PLOF and playing with her grandchildren. Due to significant change in functional status, recommending intensive multidisciplinary rehabilitation >3 hours/day to optimize safety and independence in ADL.       If plan is discharge home, recommend the following:   A lot of help with walking and/or transfers;A lot of help with bathing/dressing/bathroom;Assistance with cooking/housework;Assist for transportation;Help with stairs or ramp for entrance;Assistance with feeding     Functional Status Assessment   Patient has had a recent decline in their functional status and demonstrates the ability to make significant improvements in function in a reasonable and predictable amount of time.     Equipment Recommendations   Other (comment) (defer)     Recommendations for Other Services   Rehab consult     Precautions/Restrictions   Precautions Precautions: Fall;Other (comment) Recall of Precautions/Restrictions: Intact Precaution/Restrictions Comments: watch bp Restrictions Weight Bearing Restrictions Per Provider Order: No     Mobility Bed Mobility Overal bed mobility: Needs Assistance Bed Mobility: Sit to Supine       Sit to supine: Min assist, HOB elevated    General bed mobility comments: Assist to guide trunk and legs into bed.    Transfers Overall transfer level: Needs assistance Equipment used: 1 person hand held assist (Holding rail in other hand) Transfers: Sit to/from Stand Sit to Stand: Mod assist, From elevated surface           General transfer comment: Used step stool with hand rail due to height of stretcher and pt short stature. Assist to power up and stabilize. Rt knee slightly flexed.      Balance Overall balance assessment: Needs assistance Sitting-balance support: Single extremity supported, Feet unsupported, No upper extremity supported Sitting balance-Leahy Scale: Fair     Standing balance support: Single extremity supported, Bilateral upper extremity supported Standing balance-Leahy Scale: Poor Standing balance comment: UE support and min assist for static standing                           ADL either performed or assessed with clinical judgement   ADL Overall ADL's : Needs assistance/impaired Eating/Feeding: Set up;Bed level   Grooming: Moderate assistance;Sitting Grooming Details (indicate cue type and reason): for bil UE tasks Upper Body Bathing: Moderate assistance;Sitting   Lower Body Bathing: Maximal assistance;Sit to/from stand   Upper Body Dressing : Moderate assistance;Sitting;Maximal assistance   Lower Body Dressing: Maximal assistance;Sit to/from stand   Toilet Transfer: Moderate assistance           Functional mobility during ADLs: Moderate assistance       Vision Baseline Vision/History: 1 Wears glasses Ability to See in Adequate Light: 0 Adequate Patient Visual Report: No change from baseline Vision Assessment?: No apparent visual deficits     Perception  Praxis         Pertinent Vitals/Pain Pain Assessment Pain Assessment: Faces Faces Pain Scale: Hurts a little bit Pain Location: generalized Pain Descriptors / Indicators: Grimacing Pain  Intervention(s): Limited activity within patient's tolerance, Monitored during session     Extremity/Trunk Assessment Upper Extremity Assessment Upper Extremity Assessment: RUE deficits/detail;Right hand dominant RUE Deficits / Details: grossly 2-/5. Facilitating neuromuscular activation with sweep-tap method and pt able to open and close fist with increased time. pt reports poor grasp at baseline with decr ROM in digits to make full fist due to RA. Pt with 2-/5 elbow flexion gravity eliminated. Denies changes in sensation, but seems to have decr proprioception/awareness of arm in space. trace activation with shoulder shrug RUE Sensation: decreased proprioception RUE Coordination: decreased fine motor;decreased gross motor   Lower Extremity Assessment Lower Extremity Assessment: Defer to PT evaluation       Communication Communication Communication: No apparent difficulties   Cognition Arousal: Alert Behavior During Therapy: WFL for tasks assessed/performed Cognition: No apparent impairments             OT - Cognition Comments: pleasant, conversational, oriented                 Following commands: Intact       Cueing  General Comments   Cueing Techniques: Verbal cues      Exercises Exercises: Other exercises Other Exercises Other Exercises: A/AAROM of RUE gravity eliminated   Shoulder Instructions      Home Living Family/patient expects to be discharged to:: Private residence Living Arrangements: Children;Other (Comment) (dtr and grandchildren) Available Help at Discharge: Family Type of Home: House Home Access: Stairs to enter [employer] of Steps: 1 Entrance Stairs-Rails: None Home Layout: Two level;Laundry or work area in basement;Able to live on main level with bedroom/bathroom Alternate Teacher, music of Steps: flight Alternate Level Stairs-Rails: Right Bathroom Shower/Tub: Tub/shower unit         Home Equipment: Hand held  shower head;Grab bars - tub/shower;Grab bars - toilet   Additional Comments: May have a w/c available      Prior Functioning/Environment Prior Level of Function : Independent/Modified Independent;History of Falls (last six months)             Mobility Comments: No assistive device. 2 falls in last month; several falls over last year ADLs Comments: independent    OT Problem List: Decreased strength;Impaired balance (sitting and/or standing);Decreased activity tolerance;Decreased range of motion;Decreased coordination;Decreased knowledge of use of DME or AE;Impaired UE functional use   OT Treatment/Interventions: Self-care/ADL training;Therapeutic exercise;DME and/or AE instruction;Neuromuscular education;Therapeutic activities;Patient/family education;Balance training;Cognitive remediation/compensation      OT Goals(Current goals can be found in the care plan section)   Acute Rehab OT Goals Patient Stated Goal: get better OT Goal Formulation: With patient Time For Goal Achievement: 11/09/23 Potential to Achieve Goals: Good   OT Frequency:  Min 2X/week    Co-evaluation              AM-PAC OT 6 Clicks Daily Activity     Outcome Measure Help from another person eating meals?: A Little Help from another person taking care of personal grooming?: A Lot Help from another person toileting, which includes using toliet, bedpan, or urinal?: A Lot Help from another person bathing (including washing, rinsing, drying)?: A Lot Help from another person to put on and taking off regular upper body clothing?: A Lot Help from another person to put on and taking off regular lower body clothing?:  A Lot 6 Click Score: 13   End of Session Equipment Utilized During Treatment: Gait belt;Other (comment) (step stool with handle) Nurse Communication: Mobility status  Activity Tolerance: Patient tolerated treatment well Patient left: in bed;with call bell/phone within reach  OT Visit  Diagnosis: Unsteadiness on feet (R26.81);Muscle weakness (generalized) (M62.81);Hemiplegia and hemiparesis Hemiplegia - Right/Left: Right Hemiplegia - dominant/non-dominant: Dominant                Time: 8861-8789 OT Time Calculation (min): 32 min Charges:  OT General Charges $OT Visit: 1 Visit OT Evaluation $OT Eval Moderate Complexity: 1 Mod  Elma JONETTA Lebron FREDERICK, OTR/L [facility] Acute Rehabilitation Office: [phone]   Elma JONETTA Lebron 10/26/2023, 4:36 PM

## 2023-10-26 NOTE — Progress Notes (Addendum)
 STROKE TEAM PROGRESS NOTE    SIGNIFICANT HOSPITAL EVENTS  7/13: presents to the ED via EMS as a Code Stroke after acute onset of right sided weakness.  7/14: MRI shows acute left infarcts.   INTERIM HISTORY/SUBJECTIVE  On exam, patient has improved right arm strength, but still has weakness in wrist and hand, ataxia and decreased fine motor movements.  MRI scan shows nonhemorrhagic small left precentral gyrus as well as small lacunar left temporal and parietal white matter infarcts.  Patient is on Eliquis  and states she has been compliant with taking it.  She is willing to change to Pradaxa  as long as she can afford the co-pay OBJECTIVE  CBC    Component Value Date/Time   WBC 7.6 10/25/2023 2215   RBC 3.96 10/25/2023 2215   HGB 12.2 10/25/2023 2217   HCT 36.0 10/25/2023 2217   PLT 254 10/25/2023 2215   MCV 94.4 10/25/2023 2215   MCH 30.3 10/25/2023 2215   MCHC 32.1 10/25/2023 2215   RDW 13.1 10/25/2023 2215   LYMPHSABS 1.5 10/25/2023 2215   MONOABS 1.0 10/25/2023 2215   EOSABS 0.1 10/25/2023 2215   BASOSABS 0.1 10/25/2023 2215    BMET    Component Value Date/Time   NA 141 10/25/2023 2217   K 4.0 10/25/2023 2217   CL 107 10/25/2023 2217   CO2 24 10/25/2023 2215   GLUCOSE 88 10/25/2023 2217   BUN 18 10/25/2023 2217   CREATININE 1.00 10/25/2023 2217   CALCIUM  8.9 10/25/2023 2215   GFRNONAA >60 10/25/2023 2215    IMAGING past 24 hours MR BRAIN WO CONTRAST Result Date: 10/26/2023 CLINICAL DATA:  Neuro deficit, acute, stroke suspected EXAM: MRI HEAD WITHOUT CONTRAST TECHNIQUE: Multiplanar, multiecho pulse sequences of the brain and surrounding structures were obtained without intravenous contrast. COMPARISON:  CT angiogram of the head dated October 25, 2023 and MRI of the head dated February 22, 2022. FINDINGS: Brain: There is restricted in diffusion involving the left precentral gyrus there is also a small acute subcortical lacunar infarct at the left temporal occipital  junction, which is seen on image 69 of series 5. An additional more subtle focus of restricted diffusion is present within the left parietal white matter on image 79. There is age related volume loss and extensive diffuse cerebral white matter disease. There is no evidence of hemorrhage, mass or hydrocephalus. Vascular: Normal vascular flow voids. Skull and upper cervical spine: Normal marrow signal. No osseous lesions. Sinuses/Orbits: Clear paranasal sinuses. Status post bilateral lens replacement. Other: None. IMPRESSION: 1. Acute nonhemorrhagic infarct involving the left precentral gyrus. 2. Small lacunar white matter infarcts involving the left temporal occipital junction and the left parietal lobe. 3. Advanced cerebral white matter disease. Electronically Signed   By: Evalene Coho M.D.   On: 10/26/2023 12:20   CT ANGIO HEAD NECK W WO CM (CODE STROKE) Result Date: 10/25/2023 CLINICAL DATA:  Neuro deficit, acute, stroke suspected EXAM: CT ANGIOGRAPHY HEAD AND NECK WITH AND WITHOUT CONTRAST TECHNIQUE: Multidetector CT imaging of the head and neck was performed using the standard protocol during bolus administration of intravenous contrast. Multiplanar CT image reconstructions and MIPs were obtained to evaluate the vascular anatomy. Carotid stenosis measurements (when applicable) are obtained utilizing NASCET criteria, using the distal internal carotid diameter as the denominator. RADIATION DOSE REDUCTION: This exam was performed according to the departmental dose-optimization program which includes automated exposure control, adjustment of the mA and/or kV according to patient size and/or use of iterative reconstruction technique. CONTRAST:  75mL OMNIPAQUE  IOHEXOL  350 MG/ML SOLN COMPARISON:  None Available. FINDINGS: CTA NECK FINDINGS Aortic arch: Great vessel origins are patent. Aortic atherosclerosis. Right carotid system: No evidence of dissection, stenosis (50% or greater), or occlusion. Left carotid  system: Approximately 50% stenosis of the left proximal ICA due to atherosclerosis. Vertebral arteries: Severe left vertebral artery origin stenosis. Patent bilaterally. Right dominant. Skeleton: No evidence of acute abnormality on limited assessment. Other neck: No evidence of acute abnormality on limited assessment. Upper chest: Visualized lung apices are clear. Review of the MIP images confirms the above findings CTA HEAD FINDINGS Anterior circulation: Bilateral intracranial ICAs, MCAs, and are patent without proximal hemodynamically significant stenosis. Posterior circulation: Bilateral intradural vertebral arteries, basilar artery and bilateral posterior arteries are patent without proximal 8 image least significant stenosis. Venous sinuses: As permitted by contrast timing, patent. Review of the MIP images confirms the above findings IMPRESSION: 1. No emergent large vessel occlusion. 2. Severe left vertebral artery origin stenosis. 3. 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 4. Aortic Atherosclerosis (ICD10-I70.0). Electronically Signed   By: Gilmore GORMAN Molt M.D.   On: 10/25/2023 22:53   CT HEAD CODE STROKE WO CONTRAST Result Date: 10/25/2023 CLINICAL DATA:  Code stroke.  Neuro deficit, acute, stroke suspected EXAM: CT HEAD WITHOUT CONTRAST TECHNIQUE: Contiguous axial images were obtained from the base of the skull through the vertex without intravenous contrast. RADIATION DOSE REDUCTION: This exam was performed according to the departmental dose-optimization program which includes automated exposure control, adjustment of the mA and/or kV according to patient size and/or use of iterative reconstruction technique. COMPARISON:  CT headMay 9, 2025. FINDINGS: Brain: Similar patchy hypodensities in the white matter and deep gray nuclei, compatible with chronic microvascular ischemic change. Small remote infarct in the left caudate superiorly. No evidence of acute large vascular territory infarct,  acute hemorrhage, mass lesion or midline shift. Vascular: No hyperdense vessel. Skull: No acute fracture. Sinuses/Orbits: Clear sinuses.  No acute orbital findings. Other: No mastoid effusions. ASPECTS [facility] Stroke Program Early CT Score) Total score (0-10 with 10 being normal): 10. IMPRESSION: 1. No evidence of acute intracranial abnormality. ASPECTS is 10. 2. Chronic microvascular ischemic change. Code stroke imaging results were communicated on 10/25/2023 at 10:34 pm to provider Dr. Merrianne via secure text paging. Electronically Signed   By: Gilmore GORMAN Molt M.D.   On: 10/25/2023 22:35    Vitals:   10/26/23 1300 10/26/23 1315 10/26/23 1330 10/26/23 1603  BP: (!) 177/86 (!) 162/94 123/72   Pulse: (!) 59 (!) 59 62   Resp: 16 14 (!) 23   Temp:    98.1 F (36.7 C)  TempSrc:    Oral  SpO2: 95% 95% 97%   Weight:      Height:         PHYSICAL EXAM General:  Alert, well-nourished, well-developed patient in no acute distress Psych:  Mood and affect appropriate for situation CV: Irregular Respiratory:  Regular, unlabored respirations on room air  NEURO:  Mental Status: AA&Ox3, patient is able to give clear and coherent history Speech/Language: speech is without dysarthria or aphasia.  Naming, repetition, fluency, and comprehension intact.  Cranial Nerves:  II: PERRL. Visual fields full.  III, IV, VI: EOMI. Eyelids elevate symmetrically.  V: Sensation is intact to light touch and symmetrical to face.  VII: Face is symmetrical resting and smiling VIII: hearing intact to voice. IX, X: No hoarseness or dysarthria.  KP:Dynloizm shrug 5/5. XII: tongue is midline without fasciculations. Motor:  RUE:  4-/5, increased strength from admission, able to move fingers and grip is stronger per patient.  RLE: 4+/5  Tone: is normal and bulk is normal Sensation- Intact to light touch bilaterally. Extinction absent to light touch to DSS.   Coordination: FTN decreased to RUE, with ataxia and  decreased fine motor movements noted.  Gait- deferred  Most Recent NIH:3    ASSESSMENT/PLAN  [name] is a 68 y.o. female with history of atrial fibrillation (on Eliquis ), fibromyalgia, hypercholesterolemia, HTN, osteoarthritis, prediabetes and rheumatoid arthritis  who presents to the ED via EMS as a Code Stroke after acute onset of right sided weakness  admitted for stroke workup. MRI shows left frontal, tempo-parietal lobe acute infarcts.  NIH on Admission 4.  Acute Ischemic Infarct:  left frontal, temporo-parietal Etiology:  likely due to Afib while on OAC likely eliquis  failure  Code Stroke CT head No acute abnormality. Small vessel disease.    CTA head & neck  No LVO Severe left vertebral artery origin stenosis Approximately 50% left proximal ICA stenosis MRI   Acute left precentral gyrus infarct Small lacunar white matter infarcts left temporal occipital junction and left parietal lobe Advanced cerebral white matter disease.  2D Echo: PENDING LDL 112 HgbA1c No results found for requested labs within last 1095 days. VTE prophylaxis - pradaxa  Eliquis  BID prior to admission, now on Pradaxa  (dabigatran ) twice a day  Therapy recommendations:  CIR Disposition:  pending  Atrial fibrillation Home Meds: Eliquis  5mg  Continue telemetry monitoring Change to anticoagulation with Pradaxa   150mg  Q12H  Hypertension Home meds:  metoprolol 25mg  daily Stable Permissive hypertension for 24 to 48 hours after symptom onset, then gradually normalize.  Hyperlipidemia Home meds:  Lipitor  80mg , Zetia  10mg --resumed. Also on Repatha at home.  LDL 112, goal < 70 Add Leqvio pending insurance approval, form to be submitted  Continue statin at discharge  Other Stroke Risk Factors Obesity, Body mass index is 31.18 kg/m., BMI >/= 30 associated with increased stroke risk, recommend weight loss, diet and exercise as appropriate   Other Active Problems History of rheumatoid arthritis,  History of fibromyalgia PTA meds: Gabapentin  300mg  TID, Aleve 440mg  BID PRN, Zanaflex 2mg  BID  Hospital day # 0   Pt seen by Neuro NP/APP and later by MD. Note/plan to be edited by MD as needed.    Rocky JAYSON Likes, DNP, AGACNP-BC [facility] Please use AMION for contact information & EPIC for messaging.  I have personally obtained history,examined this patient, reviewed notes, independently viewed imaging studies, participated in medical decision making and plan of care.ROS completed by me personally and pertinent positives fully documented  I have made any additions or clarifications directly to the above note. Agree with note above.  Patient presented with sudden onset of right-sided weakness secondary to embolic left frontal cortical as well as subcortical white matter embolic infarcts from A-fib despite being on anticoagulation with Eliquis .  Had a long discussion with the patient regarding alternatives to Eliquis  and lack of definitive data suggesting superiority.  She is willing to switch to Pradaxa  as long as she can afford the co-pay.  Continue ongoing stroke workup.  Aggressive risk factor modification.  No family available at the bedside for discussion.   I personally spent a total of 50 minutes in the care of the patient today including getting/reviewing separately obtained history, performing a medically appropriate exam/evaluation, counseling and educating, placing orders, referring and communicating with other health care professionals, documenting clinical information in the EHR, independently interpreting  results, and coordinating care.         Eather Popp, MD Medical Director [facility] Stroke Center Pager: [phone] 10/26/2023 4:32 PM  To contact Stroke Continuity provider, please refer to [email or web address]. After hours, contact General Neurology

## 2023-10-26 NOTE — ED Notes (Signed)
 Ordered breakfast tray

## 2023-10-26 NOTE — ED Notes (Signed)
 CCMD called.

## 2023-10-26 NOTE — ED Notes (Signed)
Placed patient on bedpan to urinate.

## 2023-10-26 NOTE — Progress Notes (Signed)
 Echocardiogram 2D Echocardiogram has been performed.  Koleen KANDICE Popper, RDCS 10/26/2023, 1:06 PM

## 2023-10-26 NOTE — ED Provider Notes (Signed)
 I have discussed case with Dr. Shona of [facility], who agrees to admit the patient.   Raford Lenis, MD 10/26/23 [phone]

## 2023-10-27 ENCOUNTER — Encounter (HOSPITAL_COMMUNITY): Payer: Self-pay | Admitting: Internal Medicine

## 2023-10-27 ENCOUNTER — Other Ambulatory Visit (HOSPITAL_COMMUNITY): Payer: Self-pay

## 2023-10-27 ENCOUNTER — Encounter (HOSPITAL_COMMUNITY): Payer: Self-pay | Admitting: Pharmacy Technician

## 2023-10-27 DIAGNOSIS — I739 Peripheral vascular disease, unspecified: Secondary | ICD-10-CM | POA: Diagnosis not present

## 2023-10-27 DIAGNOSIS — R2990 Unspecified symptoms and signs involving the nervous system: Secondary | ICD-10-CM | POA: Diagnosis not present

## 2023-10-27 DIAGNOSIS — R29703 NIHSS score 3: Secondary | ICD-10-CM

## 2023-10-27 DIAGNOSIS — I10 Essential (primary) hypertension: Secondary | ICD-10-CM

## 2023-10-27 DIAGNOSIS — E782 Mixed hyperlipidemia: Secondary | ICD-10-CM

## 2023-10-27 DIAGNOSIS — I639 Cerebral infarction, unspecified: Secondary | ICD-10-CM | POA: Diagnosis not present

## 2023-10-27 DIAGNOSIS — I6381 Other cerebral infarction due to occlusion or stenosis of small artery: Secondary | ICD-10-CM | POA: Diagnosis not present

## 2023-10-27 DIAGNOSIS — E785 Hyperlipidemia, unspecified: Secondary | ICD-10-CM

## 2023-10-27 DIAGNOSIS — I480 Paroxysmal atrial fibrillation: Secondary | ICD-10-CM | POA: Diagnosis not present

## 2023-10-27 DIAGNOSIS — E669 Obesity, unspecified: Secondary | ICD-10-CM

## 2023-10-27 DIAGNOSIS — I4891 Unspecified atrial fibrillation: Secondary | ICD-10-CM | POA: Diagnosis not present

## 2023-10-27 LAB — HEMOGLOBIN A1C
Hgb A1c MFr Bld: 5.6 % (ref 4.8–5.6)
Mean Plasma Glucose: 114.02 mg/dL

## 2023-10-27 NOTE — Progress Notes (Signed)
 Physical Therapy Treatment Patient Details Name: Holly Henry MRN: [id number] DOB: 1955/07/23 Today's Date: 10/27/2023   History of Present Illness Pt is a 68 y.o. female presenting of R sided weakness. CTH negative for acute findings. 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 MRI showed multiple lt cerebral infarcts. PMH significant for P A-fib, HTN, HLD, RA, GERD, IBS, anxiety/depression, prior CVA.    PT Comments  Pt received in supine and agreeable to session. Pt requests to use the bathroom and is able to perform pericare and hand hygiene with up to CGA for safety. Pt able to tolerate short gait trial in the room, but is limited by fatigue. RLE weakness noted, but no buckling. Pt able to perform BLE exercises with increased difficulty with RLE and focus on control. Encouraged increased mobility and up in chair during the day with pt verbalizing understanding. Pt continues to benefit from PT services to progress toward functional mobility goals.    If plan is discharge home, recommend the following:     Can travel by private vehicle        Equipment Recommendations  Other (comment) (TBD)    Recommendations for Other Services       Precautions / Restrictions Precautions Precautions: Fall;Other (comment) Recall of Precautions/Restrictions: Intact Precaution/Restrictions Comments: watch bp Restrictions Weight Bearing Restrictions Per Provider Order: No     Mobility  Bed Mobility Overal bed mobility: Needs Assistance Bed Mobility: Sit to Supine, Supine to Sit     Supine to sit: Contact guard, Used rails, HOB elevated Sit to supine: Contact guard assist, HOB elevated, Used rails   General bed mobility comments: increased time and effort    Transfers Overall transfer level: Needs assistance Equipment used: Rolling walker (2 wheels) Transfers: Sit to/from Stand Sit to Stand: Min assist           General transfer comment:  Light min A from EOB and low toilet seat with cues for hand placement    Ambulation/Gait Ambulation/Gait assistance: Min assist, Contact guard assist Gait Distance (Feet): 10 Feet (x2) Assistive device: Rolling walker (2 wheels) Gait Pattern/deviations: Step-through pattern, Decreased stride length, Decreased stance time - right, Knee flexed in stance - right, Trunk flexed       General Gait Details: Slow gait with mild RLE instability requiring cues for quad contraction during stance and intermittent min A for stability. Cues for RW management with pt running into obstacles, increased R step length, and upright posture   Stairs             Wheelchair Mobility     Tilt Bed    Modified Rankin (Stroke Patients Only) Modified Rankin (Stroke Patients Only) Pre-Morbid Rankin Score: No symptoms Modified Rankin: Moderately severe disability     Balance Overall balance assessment: Needs assistance Sitting-balance support: Single extremity supported, Feet unsupported, No upper extremity supported Sitting balance-Leahy Scale: Fair Sitting balance - Comments: sitting EOB   Standing balance support: Bilateral upper extremity supported, During functional activity, Reliant on assistive device for balance Standing balance-Leahy Scale: Poor Standing balance comment: with RW support                            Communication Communication Communication: No apparent difficulties  Cognition Arousal: Alert Behavior During Therapy: WFL for tasks assessed/performed   PT - Cognitive impairments: No apparent impairments  Following commands: Intact      Cueing Cueing Techniques: Verbal cues  Exercises General Exercises - Lower Extremity Long Arc Quad: AROM, Seated, Both, 10 reps Heel Slides: AROM, Supine, Right, 10 reps Hip Flexion/Marching: AROM, Seated, Both, 10 reps    General Comments        Pertinent Vitals/Pain Pain  Assessment Pain Assessment: Faces Faces Pain Scale: Hurts a little bit Pain Location: back Pain Descriptors / Indicators: Aching Pain Intervention(s): Limited activity within patient's tolerance, Monitored during session     PT Goals (current goals can now be found in the care plan section) Progress towards PT goals: Progressing toward goals    Frequency    Min 2X/week       AM-PAC PT 6 Clicks Mobility   Outcome Measure  Help needed turning from your back to your side while in a flat bed without using bedrails?: A Little Help needed moving from lying on your back to sitting on the side of a flat bed without using bedrails?: A Little Help needed moving to and from a bed to a chair (including a wheelchair)?: A Little Help needed standing up from a chair using your arms (e.g., wheelchair or bedside chair)?: A Little Help needed to walk in hospital room?: A Lot (>39ft) Help needed climbing 3-5 steps with a railing? : Total 6 Click Score: 15    End of Session Equipment Utilized During Treatment: Gait belt Activity Tolerance: Patient tolerated treatment well Patient left: in bed;with call bell/phone within reach;with bed alarm set Nurse Communication: Mobility status PT Visit Diagnosis: Other abnormalities of gait and mobility (R26.89);Hemiplegia and hemiparesis;Muscle weakness (generalized) (M62.81);History of falling (Z91.81) Hemiplegia - Right/Left: Right Hemiplegia - dominant/non-dominant: Dominant Hemiplegia - caused by: Cerebral infarction     Time: 0905-0930 PT Time Calculation (min) (ACUTE ONLY): 25 min  Charges:    $Gait Training: 8-22 mins $Therapeutic Activity: 8-22 mins PT General Charges $$ ACUTE PT VISIT: 1 Visit                    Darryle George, PTA Acute Rehabilitation Services Secure Chat Preferred  [phone]    Darryle George 10/27/2023, 9:58 AM

## 2023-10-27 NOTE — Progress Notes (Signed)
 STROKE TEAM PROGRESS NOTE    SIGNIFICANT HOSPITAL EVENTS  7/13: presents to the ED via EMS as a Code Stroke after acute onset of right sided weakness.  7/14: MRI shows acute left infarcts.   INTERIM HISTORY/SUBJECTIVE  Patient sitting up in bed.  She looks comfortable.  On exam patient has improved right arm strength, but still has weakness in wrist and hand, ataxia and decreased fine motor movements.  Echocardiogram showed EF 65 to 70% with mild left atrial dilatation.  OBJECTIVE  CBC    Component Value Date/Time   WBC 7.6 10/25/2023 2215   RBC 3.96 10/25/2023 2215   HGB 12.2 10/25/2023 2217   HCT 36.0 10/25/2023 2217   PLT 254 10/25/2023 2215   MCV 94.4 10/25/2023 2215   MCH 30.3 10/25/2023 2215   MCHC 32.1 10/25/2023 2215   RDW 13.1 10/25/2023 2215   LYMPHSABS 1.5 10/25/2023 2215   MONOABS 1.0 10/25/2023 2215   EOSABS 0.1 10/25/2023 2215   BASOSABS 0.1 10/25/2023 2215    BMET    Component Value Date/Time   NA 141 10/25/2023 2217   K 4.0 10/25/2023 2217   CL 107 10/25/2023 2217   CO2 24 10/25/2023 2215   GLUCOSE 88 10/25/2023 2217   BUN 18 10/25/2023 2217   CREATININE 1.00 10/25/2023 2217   CALCIUM  8.9 10/25/2023 2215   GFRNONAA >60 10/25/2023 2215    IMAGING past 24 hours No results found.   Vitals:   10/27/23 0550 10/27/23 0624 10/27/23 0751 10/27/23 1113  BP:  (!) 165/86 (!) 165/80 (!) 160/81  Pulse:  71 70 65  Resp:  18 18 18   Temp: 98.4 F (36.9 C) 97.9 F (36.6 C) 97.8 F (36.6 C) 97.8 F (36.6 C)  TempSrc: Oral Oral Oral Oral  SpO2:  97% 96% 95%  Weight:  76.7 kg    Height:  5' 1 (1.549 m)       PHYSICAL EXAM General:  Alert, well-nourished, well-developed patient in no acute distress Psych:  Mood and affect appropriate for situation CV: Irregular Respiratory:  Regular, unlabored respirations on room air  NEURO:  Mental Status: AA&Ox3, patient is able to give clear and coherent history Speech/Language: speech is without dysarthria  or aphasia.  Naming, repetition, fluency, and comprehension intact.  Cranial Nerves:  II: PERRL. Visual fields full.  III, IV, VI: EOMI. Eyelids elevate symmetrically.  V: Sensation is intact to light touch and symmetrical to face.  VII: Face is symmetrical resting and smiling VIII: hearing intact to voice. IX, X: No hoarseness or dysarthria.  KP:Dynloizm shrug 5/5. XII: tongue is midline without fasciculations. Motor:  RUE: 4-/5, increased strength from admission, able to move fingers and grip is stronger per patient.  RLE: 4+/5  Tone: is normal and bulk is normal Sensation- Intact to light touch bilaterally. Extinction absent to light touch to DSS.   Coordination: FTN decreased to RUE, with ataxia and decreased fine motor movements noted.  Gait- deferred  Most Recent NIH:3    ASSESSMENT/PLAN  Holly Henry is a 68 y.o. female with history of atrial fibrillation (on Eliquis ), fibromyalgia, hypercholesterolemia, HTN, osteoarthritis, prediabetes and rheumatoid arthritis  who presents to the ED via EMS as a Code Stroke after acute onset of right sided weakness  admitted for stroke workup. MRI shows left frontal, tempo-parietal lobe acute infarcts.  NIH on Admission 4.  Acute Ischemic Infarct:  left frontal, temporo-parietal Etiology:  likely due to Afib while on OAC likely eliquis  failure  Code  Stroke CT head No acute abnormality. Small vessel disease.    CTA head & neck  No LVO Severe left vertebral artery origin stenosis Approximately 50% left proximal ICA stenosis MRI   Acute left precentral gyrus infarct Small lacunar white matter infarcts left temporal occipital junction and left parietal lobe Advanced cerebral white matter disease.  2D Echo: EF 65 to 70%.  Mild left atrial dilatation.  LDL 112 HgbA1c 5.6 VTE prophylaxis - pradaxa  Eliquis  BID prior to admission, now on Pradaxa  (dabigatran ) twice a day  Therapy recommendations:  CIR Disposition:  pending  Atrial  fibrillation Home Meds: Eliquis  5mg  Continue telemetry monitoring Change to anticoagulation with Pradaxa   150mg  Q12H  Hypertension Home meds:  metoprolol 25mg  daily Stable Permissive hypertension for 24 to 48 hours after symptom onset, then gradually normalize.  Hyperlipidemia Home meds:  Lipitor  80mg , Zetia  10mg --resumed. Also on Repatha at home.  LDL 112, goal < 70 Add Leqvio pending insurance approval, form to be submitted  Continue statin at discharge  Other Stroke Risk Factors Obesity, Body mass index is 31.95 kg/m., BMI >/= 30 associated with increased stroke risk, recommend weight loss, diet and exercise as appropriate   Other Active Problems History of rheumatoid arthritis, History of fibromyalgia PTA meds: Gabapentin  300mg  TID, Aleve 440mg  BID PRN, Zanaflex 2mg  BID  Hospital day # 1     Patient presented with sudden onset of right-sided weakness secondary to embolic left frontal cortical as well as subcortical white matter embolic infarcts from A-fib despite being on anticoagulation with Eliquis .  Had a long discussion with the patient regarding alternatives to Eliquis  and lack of definitive data suggesting superiority.  She is willing to switch to Pradaxa   Aggressive risk factor modification.  No family available at the bedside for discussion.  Stroke team will sign off.  Follow-up as an outpatient with stroke clinic in 2 months with nurse practitioner   I personally spent a total of 25 minutes in the care of the patient today including getting/reviewing separately obtained history, performing a medically appropriate exam/evaluation, counseling and educating, placing orders, referring and communicating with other health care professionals, documenting clinical information in the EHR, independently interpreting results, and coordinating care.         Eather Popp, MD Medical Director [facility] Stroke Center Pager: [phone] 10/27/2023 1:17 PM  To contact Stroke  Continuity provider, please refer to [email or web address]. After hours, contact General Neurology

## 2023-10-27 NOTE — Progress Notes (Signed)
  Inpatient Rehabilitation Admissions Coordinator   Met with patient at bedside for rehab assessment. We discussed goals and expectations of a possible CIR admit. She prefers CIR for rehab. Patient's daughter can provide 24/7 supervision as needed after CIR. I will ask Rehab MD to consult to assist with obtaining Auth. I will begin insurance Auth with [facility] Medicare for possible CIR admit pending approval. Please call me with any questions.   Heron Leavell, RN, MSN Rehab Admissions Coordinator [phone]

## 2023-10-27 NOTE — Progress Notes (Signed)
 Progress Note   Patient: Holly Henry FMW:[id number] DOB: 1955-12-22 DOA: 10/25/2023     1 DOS: the patient was seen and examined on 10/27/2023   Brief hospital course: Holly Henry is a 68 y.o. female with medical history significant for paroxysmal A-fib on Eliquis , hypertension, hyperlipidemia, rheumatoid arthritis, GERD, IBS, chronic anxiety/depression, prior CVA, who presented to the ER from home via EMS with complaints of sudden onset of right arm and leg weakness.  Patient is admitted to the hospitalist service for further management evaluation of acute stroke.  MRI brain showed acute nonhemorrhagic infarct involving left precentral gyrus.  Neurology followed the patient.  Assessment and Plan: Acute ischemic stroke: MRI brain showed left precentral gyrus acute nonhemorrhagic stroke. Neurology evaluation appreciated. This could be related to Eliquis  failure, started on Pradaxa  twice daily therapy. PT OT recommended inpatient rehab. Patient is able to swallow well, tolerating diet. LDL 112, A1c 5.6. Echo EF 70% Continue statin therapy.  Atrial fibrillation: Patient will be continued metoprolol, Pradaxa  150 mg every 12 hourly.  Hypertension: BP stable.  Continue metoprolol.  Hyperlipidemia: Continue Lipitor , Zetia  therapy.  Obesity with BMI 31.95. Diet, exercise and weight reduction advised.     Out of bed to chair. Incentive spirometry. Nursing supportive care. Fall, aspiration precautions. Diet:  Diet Orders (From admission, onward)     Start     Ordered   10/26/23 0901  Diet Heart Room service appropriate? Yes; Fluid consistency: Thin  Diet effective now       Question Answer Comment  Room service appropriate? Yes   Fluid consistency: Thin      10/26/23 0901           DVT prophylaxis:  dabigatran  (PRADAXA ) capsule 150 mg  Level of care: Telemetry Medical   Code Status: Full Code  Subjective: Patient is seen and examined today morning.  She is lying  comfortably, states that her right arm weakness is improving gradually.  Able to squeeze my hand, lift it from bed.  Physical Exam: Vitals:   10/27/23 0550 10/27/23 0624 10/27/23 0751 10/27/23 1113  BP:  (!) 165/86 (!) 165/80 (!) 160/81  Pulse:  71 70 65  Resp:  18 18 18   Temp: 98.4 F (36.9 C) 97.9 F (36.6 C) 97.8 F (36.6 C) 97.8 F (36.6 C)  TempSrc: Oral Oral Oral Oral  SpO2:  97% 96% 95%  Weight:  76.7 kg    Height:  5' 1 (1.549 m)      General - Elderly Caucasian obese female, no apparent distress HEENT - PERRLA, EOMI, atraumatic head, non tender sinuses. Lung - Clear, no rales, rhonchi, wheezes. Heart - S1, S2 heard, no murmurs, rubs, no pedal edema. Abdomen - Soft, non tender, bowel sounds good Neuro - Alert, awake and oriented x 3, right arm weakness 3/5 Skin - Warm and dry.  Data Reviewed:      Latest Ref Rng & Units 10/25/2023   10:17 PM 10/25/2023   10:15 PM 05/18/2022    7:00 AM  CBC  WBC 4.0 - 10.5 K/uL  7.6  9.5   Hemoglobin 12.0 - 15.0 g/dL 87.7  87.9  87.8   Hematocrit 36.0 - 46.0 % 36.0  37.4  37.4   Platelets 150 - 400 K/uL  254  239       Latest Ref Rng & Units 10/25/2023   10:17 PM 10/25/2023   10:15 PM 05/18/2022    7:00 AM  BMP  Glucose 70 - 99 mg/dL 88  93  115   BUN 8 - 23 mg/dL 18  17  29    Creatinine 0.44 - 1.00 mg/dL 8.99  9.07  8.82   Sodium 135 - 145 mmol/L 141  139  140   Potassium 3.5 - 5.1 mmol/L 4.0  4.0  3.3   Chloride 98 - 111 mmol/L 107  105  107   CO2 22 - 32 mmol/L  24  23   Calcium  8.9 - 10.3 mg/dL  8.9  8.8    ECHOCARDIOGRAM COMPLETE Result Date: 10/26/2023    ECHOCARDIOGRAM REPORT   Patient Name:   Holly Henry Date of Exam: 10/26/2023 Medical Rec #:  [id number]   Height:       61.0 in Accession #:    [id number]  Weight:       165.0 lb Date of Birth:  Apr 24, 1955   BSA:          1.740 m Patient Age:    68 years    BP:           176/72 mmHg Patient Gender: F           HR:           62 bpm. Exam Location:  Inpatient Procedure: 2D  Echo, 3D Echo, Color Doppler, Cardiac Doppler and Strain Analysis            (Both Spectral and Color Flow Doppler were utilized during            procedure). Indications:     Stroke I63.9  History:         Patient has no prior history of Echocardiogram examinations.                  TIA; Risk Factors:Hypertension and Pre-diabetes.  Sonographer:     Koleen Popper RDCS Referring Phys:  [phone] ERIC LINDZEN Diagnosing Phys: Salena Negri MD  Sonographer Comments: Global longitudinal strain was attempted. IMPRESSIONS  1. Left ventricular ejection fraction, by estimation, is 65 to 70%. The left ventricle has normal function. The left ventricle has no regional wall motion abnormalities. Left ventricular diastolic parameters are consistent with Grade I diastolic dysfunction (impaired relaxation). The global longitudinal strain is normal.  2. Right ventricular systolic function is normal. The right ventricular size is normal. There is normal pulmonary artery systolic pressure.  3. Left atrial size was mildly dilated.  4. Right atrial size was mildly dilated.  5. The mitral valve is normal in structure. Mild mitral valve regurgitation.  6. The aortic valve is tricuspid. Aortic valve regurgitation is trivial. Aortic valve sclerosis is present, with no evidence of aortic valve stenosis.  7. The inferior vena cava is normal in size with greater than 50% respiratory variability, suggesting right atrial pressure of 3 mmHg. FINDINGS  Left Ventricle: Left ventricular ejection fraction, by estimation, is 65 to 70%. The left ventricle has normal function. The left ventricle has no regional wall motion abnormalities. Strain was performed and the global longitudinal strain is normal. The  left ventricular internal cavity size was normal in size. There is borderline concentric left ventricular hypertrophy. Left ventricular diastolic parameters are consistent with Grade I diastolic dysfunction (impaired relaxation). Right Ventricle: The  right ventricular size is normal. No increase in right ventricular wall thickness. Right ventricular systolic function is normal. There is normal pulmonary artery systolic pressure. The tricuspid regurgitant velocity is 2.78 m/s, and  with an assumed right atrial pressure of 3 mmHg, the estimated right ventricular systolic  pressure is 33.9 mmHg. Left Atrium: Left atrial size was mildly dilated. Right Atrium: Right atrial size was mildly dilated. Pericardium: There is no evidence of pericardial effusion. Mitral Valve: The mitral valve is normal in structure. Mild mitral valve regurgitation. Tricuspid Valve: The tricuspid valve is normal in structure. Tricuspid valve regurgitation is mild. Aortic Valve: The aortic valve is tricuspid. Aortic valve regurgitation is trivial. Aortic valve sclerosis is present, with no evidence of aortic valve stenosis. Pulmonic Valve: The pulmonic valve was normal in structure. Pulmonic valve regurgitation is trivial. Aorta: The aortic root is normal in size and structure. Venous: The inferior vena cava is normal in size with greater than 50% respiratory variability, suggesting right atrial pressure of 3 mmHg. IAS/Shunts: No atrial level shunt detected by color flow Doppler. Additional Comments: 3D was performed not requiring image post processing on an independent workstation and was normal.  LEFT VENTRICLE PLAX 2D LVIDd:         4.20 cm   Diastology LVIDs:         2.50 cm   LV e' medial:    6.74 cm/s LV PW:         1.00 cm   LV E/e' medial:  9.5 LV IVS:        1.10 cm   LV e' lateral:   8.70 cm/s LVOT diam:     1.90 cm   LV E/e' lateral: 7.3 LV SV:         59 LV SV Index:   34 LVOT Area:     2.84 cm                           3D Volume EF:                          3D EF:        65 %                          LV EDV:       109 ml                          LV ESV:       38 ml                          LV SV:        71 ml RIGHT VENTRICLE             IVC RV S prime:     15.10 cm/s  IVC diam:  1.20 cm TAPSE (M-mode): 1.6 cm LEFT ATRIUM             Index LA diam:        4.40 cm 2.53 cm/m LA Vol (A2C):   35.4 ml 20.34 ml/m LA Vol (A4C):   41.1 ml 23.61 ml/m LA Biplane Vol: 38.7 ml 22.24 ml/m  AORTIC VALVE LVOT Vmax:   94.30 cm/s LVOT Vmean:  60.300 cm/s LVOT VTI:    0.209 m  AORTA Ao Root diam: 3.20 cm Ao Asc diam:  3.20 cm MITRAL VALVE               TRICUSPID VALVE MV Area (PHT): 3.53 cm    TR Peak grad:   30.9 mmHg MV Decel Time: 215 msec  TR Vmax:        278.00 cm/s MV E velocity: 63.90 cm/s MV A velocity: 67.10 cm/s  SHUNTS MV E/A ratio:  0.95        Systemic VTI:  0.21 m                            Systemic Diam: 1.90 cm Salena Negri MD Electronically signed by Salena Negri MD Signature Date/Time: 10/26/2023/6:01:57 PM    Final    MR BRAIN WO CONTRAST Result Date: 10/26/2023 CLINICAL DATA:  Neuro deficit, acute, stroke suspected EXAM: MRI HEAD WITHOUT CONTRAST TECHNIQUE: Multiplanar, multiecho pulse sequences of the brain and surrounding structures were obtained without intravenous contrast. COMPARISON:  CT angiogram of the head dated October 25, 2023 and MRI of the head dated February 22, 2022. FINDINGS: Brain: There is restricted in diffusion involving the left precentral gyrus there is also a small acute subcortical lacunar infarct at the left temporal occipital junction, which is seen on image 69 of series 5. An additional more subtle focus of restricted diffusion is present within the left parietal white matter on image 79. There is age related volume loss and extensive diffuse cerebral white matter disease. There is no evidence of hemorrhage, mass or hydrocephalus. Vascular: Normal vascular flow voids. Skull and upper cervical spine: Normal marrow signal. No osseous lesions. Sinuses/Orbits: Clear paranasal sinuses. Status post bilateral lens replacement. Other: None. IMPRESSION: 1. Acute nonhemorrhagic infarct involving the left precentral gyrus. 2. Small lacunar white matter infarcts  involving the left temporal occipital junction and the left parietal lobe. 3. Advanced cerebral white matter disease. Electronically Signed   By: Evalene Coho M.D.   On: 10/26/2023 12:20   CT ANGIO HEAD NECK W WO CM (CODE STROKE) Result Date: 10/25/2023 CLINICAL DATA:  Neuro deficit, acute, stroke suspected EXAM: CT ANGIOGRAPHY HEAD AND NECK WITH AND WITHOUT CONTRAST TECHNIQUE: Multidetector CT imaging of the head and neck was performed using the standard protocol during bolus administration of intravenous contrast. Multiplanar CT image reconstructions and MIPs were obtained to evaluate the vascular anatomy. Carotid stenosis measurements (when applicable) are obtained utilizing NASCET criteria, using the distal internal carotid diameter as the denominator. RADIATION DOSE REDUCTION: This exam was performed according to the departmental dose-optimization program which includes automated exposure control, adjustment of the mA and/or kV according to patient size and/or use of iterative reconstruction technique. CONTRAST:  75mL OMNIPAQUE  IOHEXOL  350 MG/ML SOLN COMPARISON:  None Available. FINDINGS: CTA NECK FINDINGS Aortic arch: Great vessel origins are patent. Aortic atherosclerosis. Right carotid system: No evidence of dissection, stenosis (50% or greater), or occlusion. Left carotid system: Approximately 50% stenosis of the left proximal ICA due to atherosclerosis. Vertebral arteries: Severe left vertebral artery origin stenosis. Patent bilaterally. Right dominant. Skeleton: No evidence of acute abnormality on limited assessment. Other neck: No evidence of acute abnormality on limited assessment. Upper chest: Visualized lung apices are clear. Review of the MIP images confirms the above findings CTA HEAD FINDINGS Anterior circulation: Bilateral intracranial ICAs, MCAs, and are patent without proximal hemodynamically significant stenosis. Posterior circulation: Bilateral intradural vertebral arteries, basilar  artery and bilateral posterior arteries are patent without proximal 8 image least significant stenosis. Venous sinuses: As permitted by contrast timing, patent. Review of the MIP images confirms the above findings IMPRESSION: 1. No emergent large vessel occlusion. 2. Severe left vertebral artery origin stenosis. 3. 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 4. Aortic Atherosclerosis (ICD10-I70.0).  Electronically Signed   By: Gilmore GORMAN Molt M.D.   On: 10/25/2023 22:53   CT HEAD CODE STROKE WO CONTRAST Result Date: 10/25/2023 CLINICAL DATA:  Code stroke.  Neuro deficit, acute, stroke suspected EXAM: CT HEAD WITHOUT CONTRAST TECHNIQUE: Contiguous axial images were obtained from the base of the skull through the vertex without intravenous contrast. RADIATION DOSE REDUCTION: This exam was performed according to the departmental dose-optimization program which includes automated exposure control, adjustment of the mA and/or kV according to patient size and/or use of iterative reconstruction technique. COMPARISON:  CT headMay 9, 2025. FINDINGS: Brain: Similar patchy hypodensities in the white matter and deep gray nuclei, compatible with chronic microvascular ischemic change. Small remote infarct in the left caudate superiorly. No evidence of acute large vascular territory infarct, acute hemorrhage, mass lesion or midline shift. Vascular: No hyperdense vessel. Skull: No acute fracture. Sinuses/Orbits: Clear sinuses.  No acute orbital findings. Other: No mastoid effusions. ASPECTS [facility] Stroke Program Early CT Score) Total score (0-10 with 10 being normal): 10. IMPRESSION: 1. No evidence of acute intracranial abnormality. ASPECTS is 10. 2. Chronic microvascular ischemic change. Code stroke imaging results were communicated on 10/25/2023 at 10:34 pm to provider Dr. Merrianne via secure text paging. Electronically Signed   By: Gilmore GORMAN Molt M.D.   On: 10/25/2023 22:35    Family Communication: Discussed  with patient, understand and agree. All questions answered.  Disposition: Status is: Inpatient Remains inpatient appropriate because: Stroke workup  Planned Discharge Destination: Rehab     Time spent: 42 minutes  Author: Concepcion Riser, MD 10/27/2023 2:55 PM Secure chat 7am to 7pm For on call review [email or web address].

## 2023-10-27 NOTE — TOC CAGE-AID Note (Signed)
 Transition of Care [facility]) - CAGE-AID Screening   Patient Details  Name: Holly Henry MRN: [id number] Date of Birth: 11-08-1955  [facility] of [facility]) CM/SW Contact:    Pervis Macintyre E Tiffanee Mcnee, LCSW Phone Number: 10/27/2023, 10:00 AM   Clinical Narrative: No SA noted.   CAGE-AID Screening:    Have You Ever Felt You Ought to Cut Down on Your Drinking or Drug Use?: No Have People Annoyed You By Critizing Your Drinking Or Drug Use?: No Have You Felt Bad Or Guilty About Your Drinking Or Drug Use?: No Have You Ever Had a Drink or Used Drugs First Thing In The Morning to Steady Your Nerves or to Get Rid of a Hangover?: No CAGE-AID Score: 0  Substance Abuse Education Offered: No

## 2023-10-27 NOTE — Discharge Instructions (Addendum)
Information on my medicine - Pradaxa® (dabigatran) ° °This medication education was reviewed with me or my healthcare representative as part of my discharge preparation.  ° °Why was Pradaxa® prescribed for you? °Pradaxa® was prescribed for you to reduce the risk of forming blood clots that cause a stroke if you have a medical condition called atrial fibrillation (a type of irregular heartbeat).   ° °What do you Need to know about PradAXa®? °Take your Pradaxa® TWICE DAILY - one capsule in the morning and one tablet in the evening with or without food.  It would be best to take the doses about the same time each day. ° °The capsules should not be broken, chewed or opened - they must be swallowed whole. ° °Do not store Pradaxa in other medication containers - once the bottle is opened the Pradaxa should be used within FOUR months; throw away any capsules that haven’t been by that time. ° °Take Pradaxa® exactly as prescribed by your doctor.  DO NOT stop taking Pradaxa® without talking to the doctor who prescribed the medication.  Stopping without other stroke prevention medication to take the place of Pradaxa may increase your risk of developing a clot that causes a stroke.  Refill your prescription before you run out. ° °After discharge, you should have regular check-up appointments with your healthcare provider that is prescribing your Pradaxa®.  In the future your dose may need to be changed if your kidney function or weight changes by a significant amount. ° °What do you do if you miss a dose? °If you miss a dose, take it as soon as you remember on the same day.  If your next dose is less than 6 hours away, skip the missed dose.  Do not take two doses of PRADAXA at the same time. ° °Important Safety Information °A possible side effect of Pradaxa® is bleeding. You should call your healthcare provider right away if you experience any of the following: °? Bleeding from an injury or your nose that does not  stop. °? Unusual colored urine (red or dark brown) or unusual colored stools (red or black). °? Unusual bruising for unknown reasons. °? A serious fall or if you hit your head (even if there is no bleeding). ° °Some medicines may interact with Pradaxa® and might increase your risk of bleeding or clotting while on Pradaxa®. To help avoid this, consult your healthcare provider or pharmacist prior to using any new prescription or non-prescription medications, including herbals, vitamins, non-steroidal anti-inflammatory drugs (NSAIDs) and supplements. ° °This website has more information on Pradaxa® (dabigatran): [email or web address].pradaxa.com ° ° ° °

## 2023-10-27 NOTE — Telephone Encounter (Signed)
 Error

## 2023-10-27 NOTE — PMR Pre-admission (Signed)
 PMR Admission Coordinator Pre-Admission Assessment  Patient: Holly Henry is an 68 y.o., female MRN: [id number] DOB: February 07, 1956 Height: 5' 1 (154.9 cm) Weight: 76.7 kg             Insurance Information HMO:     PPO: yes     PCP:      IPA:      80/20:      OTHER:  PRIMARY: [employer] Choice  PPO    Policy#: [id number]   ; Medicare: [id number]   Subscriber: pt CM Name: Charlott Perfect      Phone#: [phone] ext [phone]     Fax#: [phone] Pre-Cert#: [id number] approved 7/16 until 7/25 with f/u by Loraine phone [phone] ext [phone] fax [phone]      Employer:  Benefits:  Phone #: [phone]     Name: 7/15 Eff. Date: 04/15/23     Deduct: none      Out of Pocket Max: $9350      Life Max: none  CIR: $399 co pay per day days 1 until 3($7605)      SNF: no co pay per day days 1 until 20; $214 co pay per day days 21 until 100 Outpatient: $25 per visit     Co-Pay:  Home Health: 100%      Co-Pay:  DME: 80%     Co-Pay: 20% Providers: in network  SECONDARY: none      Policy#:       Phone#:   Artist:       Phone#:   The Data processing manager" for patients in Inpatient Rehabilitation Facilities with attached "Privacy Act Statement-Health Care Records" was provided and verbally reviewed with: Patient  Emergency Contact Information Contact Information     Name Relation Home Work Mobile   Munshi,Jennifer Daughter [phone]  [phone]      Other Contacts   None on File    Current Medical History  Patient Admitting Diagnosis: CVA  History of Present Illness: 68 yo female with history for paroxysmal a fib on ELiquis , HTN, HLD, RA and fibromyalgia, GERD, IBS, chronic anxiety/depression, prior CVA, prediabetes who presented on 10/26/23 with sudden onset of right arm and leg weakness.  Blood pressure 150/106 per EMS. Cranial CT scan negative for acute changes. Chronic microvascular ischemic changes. CTA showed no emergent large vessel occlusion.  Patient did not receive TNK. MRI of the brain showed acute nonhemorrhagic infarct involving the left precentral gyrus. Small lacunar white matter infarct involving the left temporal occipital junction and the left parietal lobe. Admission chemistries unremarkable, hemoglobin A1c 6.0. Echocardiogram with ejection fraction of 65 to 70% no wall motion abnormalities grade 1 diastolic dysfunction. Neurology follow-up felt left frontal, temporoparietal infarct secondary to atrial fibrillation while on OAC likely Eliquis  failure transition to Pradaxa . Tolerating a regular consistency diet. Therapy evaluations completed due to patient decreased functional ability right side weakness was admitted for a comprehensive rehab program.   Complete NIHSS TOTAL: 3 Glasgow Coma Scale Score: 15  Patient's medical record from [facility] has been reviewed by the rehabilitation admission coordinator and physician.  Past Medical History  Past Medical History:  Diagnosis Date   A-fib (HCC)    Fibromyalgia    High cholesterol    Hypertension    Osteoarthritis    Prediabetes    Rheumatoid arthritis (HCC)    Has the patient had major surgery during 100 days prior to admission? No  Family History  family history is not on file.  Current Medications   Current Facility-Administered Medications:    acetaminophen  (TYLENOL ) tablet 650 mg, 650 mg, Oral, Q6H PRN, Shona Terry SAILOR, DO, 650 mg at 10/28/23 1451   atorvastatin  (LIPITOR ) tablet 80 mg, 80 mg, Oral, Daily, Lindzen, Eric, MD, 80 mg at 10/28/23 0941   busPIRone  (BUSPAR ) tablet 15 mg, 15 mg, Oral, BID, Hall, Carole N, DO, 15 mg at 10/28/23 0941   dabigatran  (PRADAXA ) capsule 150 mg, 150 mg, Oral, Q12H, Sreeram, Narendranath, MD, 150 mg at 10/28/23 0942   DULoxetine  (CYMBALTA ) DR capsule 90 mg, 90 mg, Oral, Daily, Shona Terry SAILOR, DO, 90 mg at 10/28/23 9057   ezetimibe  (ZETIA ) tablet 10 mg, 10 mg, Oral, Daily, Hall, Carole N, DO, 10 mg at 10/28/23 0945    melatonin tablet 5 mg, 5 mg, Oral, QHS PRN, Shona, Carole N, DO   oxyCODONE  (Oxy IR/ROXICODONE ) immediate release tablet 5 mg, 5 mg, Oral, Q4H PRN, Shona Terry N, DO, 5 mg at 10/28/23 9185   pantoprazole  (PROTONIX ) EC tablet 40 mg, 40 mg, Oral, q morning, Hall, Carole N, DO, 40 mg at 10/28/23 0941   polyethylene glycol (MIRALAX  / GLYCOLAX ) packet 17 g, 17 g, Oral, Daily PRN, Shona Terry N, DO, 17 g at 10/28/23 9057   prochlorperazine  (COMPAZINE ) injection 5 mg, 5 mg, Intravenous, Q6H PRN, Hall, Carole N, DO   sorbitol  70 % solution 30 mL, 30 mL, Oral, Daily PRN, Darci Pore, MD, 30 mL at 10/28/23 1448  Patients Current Diet:  Diet Order             Diet Heart Room service appropriate? Yes; Fluid consistency: Thin  Diet effective now                  Precautions / Restrictions Precautions Precautions: Fall, Other (comment) Precaution/Restrictions Comments: watch bp Restrictions Weight Bearing Restrictions Per Provider Order: No   Has the patient had 2 or more falls or a fall with injury in the past year?No  Prior Activity Level Community (5-7x/wk): Independent without AD, rarely drives  Prior Functional Level Prior Function Prior Level of Function : Independent/Modified Independent, History of Falls (last six months) Mobility Comments: No assistive device. 2 falls in last month; several falls over last year ADLs Comments: independent  Self Care: Did the patient need help bathing, dressing, using the toilet or eating?  Independent  Indoor Mobility: Did the patient need assistance with walking from room to room (with or without device)? Independent  Stairs: Did the patient need assistance with internal or external stairs (with or without device)? Independent  Functional Cognition: Did the patient need help planning regular tasks such as shopping or remembering to take medications? Independent  Patient Information Are you of Hispanic, Latino/a,or Spanish origin?:  A. No, not of Hispanic, Latino/a, or Spanish origin What is your race?: A. White Do you need or want an interpreter to communicate with a doctor or health care staff?: 0. No  Patient's Response To:  Health Literacy and Transportation Is the patient able to respond to health literacy and transportation needs?: Yes Health Literacy - How often do you need to have someone help you when you read instructions, pamphlets, or other written material from your doctor or pharmacy?: Never In the past 12 months, has lack of transportation kept you from medical appointments or from getting medications?: No In the past 12 months, has lack of transportation kept you from meetings, work, or from getting things needed for daily living?: No  Home Assistive Devices /  Equipment Home Equipment: Hand held shower head, Grab bars - tub/shower, Grab bars - toilet  Prior Device Use: Indicate devices/aids used by the patient prior to current illness, exacerbation or injury? None of the above  Current Functional Level Cognition  Arousal/Alertness: Awake/alert Overall Cognitive Status: Within Functional Limits for tasks assessed Orientation Level: Oriented X4    Extremity Assessment (includes Sensation/Coordination)  Upper Extremity Assessment: RUE deficits/detail, Right hand dominant RUE Deficits / Details: grossly 2-/5. pt able to complete lap slides with + time, pt abel to demonstrate gross opposition with + time and hold Rw, seems to have more of a proprioceptive issue RUE Sensation: decreased proprioception RUE Coordination: decreased fine motor, decreased gross motor  Lower Extremity Assessment: Defer to PT evaluation RLE Deficits / Details: grossly 4/5    ADLs  Overall ADL's : Needs assistance/impaired Eating/Feeding: Set up, Sitting Eating/Feeding Details (indicate cue type and reason): set- up to open lids and set- up coffee Grooming: Moderate assistance, Sitting Grooming Details (indicate cue type  and reason): for bil UE tasks Upper Body Bathing: Minimal assistance, Sitting Upper Body Bathing Details (indicate cue type and reason): simulated via UB dressing Lower Body Bathing: Maximal assistance, Sit to/from stand Lower Body Bathing Details (indicate cue type and reason): simulated via LB dressing Upper Body Dressing : Minimal assistance, Sitting Upper Body Dressing Details (indicate cue type and reason): to don back side gown Lower Body Dressing: Maximal assistance, Sit to/from stand Lower Body Dressing Details (indicate cue type and reason): attempted to don socsk from EOB but had LOB posteriorly Toilet Transfer: Minimal assistance, Rolling walker (2 wheels), Ambulation Toilet Transfer Details (indicate cue type and reason): simulated via functioanl mobility Functional mobility during ADLs: Minimal assistance, Rolling walker (2 wheels) General ADL Comments: ADL participation impacted mostly by impaired balance and RUE hemiparesis    Mobility  Overal bed mobility: Needs Assistance Bed Mobility: Supine to Sit Supine to sit: Min assist, HOB elevated Sit to supine: Contact guard assist, HOB elevated, Used rails General bed mobility comments: light MIN A to elevate trunk    Transfers  Overall transfer level: Needs assistance Equipment used: Rolling walker (2 wheels) Transfers: Sit to/from Stand Sit to Stand: Min assist General transfer comment: MIN A to rise from EOB with cues for hand placement    Ambulation / Gait / Stairs / Wheelchair Mobility  Ambulation/Gait Ambulation/Gait assistance: Min assist, Contact guard assist Gait Distance (Feet): 10 Feet (x2) Assistive device: Rolling walker (2 wheels) Gait Pattern/deviations: Step-through pattern, Decreased stride length, Decreased stance time - right, Knee flexed in stance - right, Trunk flexed General Gait Details: Slow gait with mild RLE instability requiring cues for quad contraction during stance and intermittent min A for  stability. Cues for RW management with pt running into obstacles, increased R step length, and upright posture    Posture / Balance Dynamic Sitting Balance Sitting balance - Comments: able to static sit with CGA but did experience posterior LOB with ADL task Balance Overall balance assessment: Needs assistance Sitting-balance support: Single extremity supported, Feet unsupported, No upper extremity supported Sitting balance-Leahy Scale: Fair Sitting balance - Comments: able to static sit with CGA but did experience posterior LOB with ADL task Postural control: Posterior lean Standing balance support: Bilateral upper extremity supported, During functional activity, Reliant on assistive device for balance Standing balance-Leahy Scale: Poor Standing balance comment: reliant on RW    Special needs/care consideration      Previous Home Environment  Living Arrangements:  (lives with  daughter and grandchildren)  Lives With: Family Available Help at Discharge: Family, Available 24 hours/day Type of Home: House Home Layout: Two level, Laundry or work area in basement, Able to live on main level with bedroom/bathroom Alternate Level Stairs-Rails: Right Alternate Level Stairs-Number of Steps: flight Home Access: Stairs to enter Entrance Stairs-Rails: None Entrance Stairs-Number of Steps: 1 Bathroom Shower/Tub: Engineer, manufacturing systems: Standard Bathroom Accessibility: Yes How Accessible: Accessible via walker Home Care Services: No Additional Comments: May have a w/c available  Discharge Living Setting Plans for Discharge Living Setting: Patient's home, Lives with (comment) (daughter and grandchilren) Type of Home at Discharge: House Discharge Home Layout: Two level, Laundry or work area in basement, Able to live on main level with bedroom/bathroom Alternate Level Stairs-Rails: Right Alternate Level Stairs-Number of Steps: flight Discharge Home Access: Stairs to enter Entrance  Stairs-Rails: None Entrance Stairs-Number of Steps: 1 Discharge Bathroom Shower/Tub: Tub/shower unit Discharge Bathroom Toilet: Standard Discharge Bathroom Accessibility: Yes How Accessible: Accessible via walker Does the patient have any problems obtaining your medications?: No  Social/Family/Support Systems Patient Roles: Parent Contact Information: daughter, Delon Anticipated Caregiver: daughter Anticipated Industrial/product designer Information: see contacts Ability/Limitations of Caregiver: no limitaion, unemployed an dfiling for disability Caregiver Availability: 24/7 Discharge Plan Discussed with Primary Caregiver: Yes Is Caregiver In Agreement with Plan?: Yes Does Caregiver/Family have Issues with Lodging/Transportation while Pt is in Rehab?: No  Goals Patient/Family Goal for Rehab: Mod I to supervision with PT, OT and SLP Expected length of stay: ELOS 7 to 10 days Pt/Family Agrees to Admission and willing to participate: Yes Program Orientation Provided & Reviewed with Pt/Caregiver Including Roles  & Responsibilities: Yes  Decrease burden of Care through IP rehab admission: n/a  Possible need for SNF placement upon discharge:Not anticipated  Patient Condition: This patient's condition remains as documented in the consult dated 10/27/23, in which the Rehabilitation Physician determined and documented that the patient's condition is appropriate for intensive rehabilitative care in an inpatient rehabilitation facility. Will admit to inpatient rehab today.  Preadmission Screen Completed By:  Alison Heron Lot, RN MSN 10/28/2023 3:13 PM ______________________________________________________________________   Discussed status with Dr. Urbano on 10/28/23 at 1513 and received approval for admission today.  Admission Coordinator:  Alison Heron Lot, RN MSN time [id number] Date 10/28/23

## 2023-10-27 NOTE — TOC CM/SW Note (Signed)
 Transition of Care [facility]) - Inpatient Brief Assessment   Patient Details  Name: Holly Henry MRN: [id number] Date of Birth: 12-20-1955  [facility] of [facility] CM/SW Contact:    Andrez JULIANNA George, RN Phone Number: 10/27/2023, 1:26 PM   Clinical Narrative:  CIR has started insurance for a rehab admission. IP Care management following.  Transition of Care Asessment: Insurance and Status: Insurance coverage has been reviewed Patient has primary care physician: Yes Home environment has been reviewed: home with daughter   Prior/Current Home Services: No current home services Social Drivers of Health Review: SDOH reviewed no interventions necessary Readmission risk has been reviewed: Yes Transition of care needs: transition of care needs identified, TOC will continue to follow

## 2023-10-27 NOTE — Consult Note (Signed)
 Physical Medicine and Rehabilitation Consult Reason for Consult:CIR Referring Physician: Dr Merl   HPI: Holly Henry is a 68 y.o.  R handed female  with hx of: pAfib, on Eliquis  that she's compliant with at home; HTN, HLD, RA, GERD, IBS-C; depression, anxiety, Fibromyalgia, TIA 6/24; OA, OAB uses briefs admitted 10/25/23 as a code stroke- she had R sided hemiparesis and NIHSS was 4.  She underwent MRI- which showed: multiple L cerebral infarcts-- Lparietal and L precentral gyrus as well as white matter infarcts. Also found on CTA to have:  L vertebral artery severe stenosis and 50% stenosis of L proximal ICA.   She was changed to Pradaxa  from Eliquis - and maximized her other meds.   Her LBM was 3-4 days ago.  She hasn't had one since admission.   She's voiding OK, but has chronic overactive bladder and wears briefs at home.   For pain, she's always in pain- and used to take Oxycodone  a couple of times per day, however her pain doctor didn't get a positive UDS so discharged her. She also was taking Aleve or Motrin with her Eliquis , of note and tylenol  as well. She's tried lidoderm  for her knees- wasn't helpful.  She's currently complaining of new upper/mid back pain- really painful- occurred since she was admitted. Also has OA and RA joint pain.  She also notes she easily gets skin breakdown on her buttocks due to OAB   Social Hx: Pt lives with her daughters who doesn't work- she's doing ok physically, but applying for disability due to Rheum disease- she was independent before her admission.    Review of Systems  Constitutional:  Positive for malaise/fatigue.  HENT: Negative.    Eyes: Negative.   Respiratory: Negative.    Cardiovascular:  Negative for chest pain and leg swelling.  Gastrointestinal:  Positive for constipation.  Genitourinary:  Positive for frequency.       OAB  Musculoskeletal:  Positive for back pain, joint pain and myalgias.  Skin:        Thinks  she has skin breakdown on buttocks  Neurological:  Positive for focal weakness and weakness. Negative for sensory change and speech change.  Endo/Heme/Allergies: Negative.   Psychiatric/Behavioral:  Positive for depression. The patient is nervous/anxious.   All other systems reviewed and are negative.  Past Medical History:  Diagnosis Date   A-fib (HCC)    Fibromyalgia    High cholesterol    Hypertension    Osteoarthritis    Prediabetes    Rheumatoid arthritis (HCC)    Past Surgical History:  Procedure Laterality Date   BACK SURGERY     CATARACT EXTRACTION Bilateral    TONSILLECTOMY     History reviewed. No pertinent family history. Social History:  reports that she has never smoked. She has never used smokeless tobacco. She reports that she does not drink alcohol and does not use drugs. Allergies:  Allergies  Allergen Reactions   Gramineae Pollens Dermatitis    Dust, pollen  redtop grass pollen extract   [place] Grass Extract Cough   Buprenorphine Hcl Other (See Comments)    Other Reaction(s): Mental Status Changes, Psychosis   Pollen Extract Dermatitis    Dust, pollen   Medications Prior to Admission  Medication Sig Dispense Refill   acetaminophen  (TYLENOL ) 500 MG tablet Take 1,000 mg by mouth every 6 (six) hours as needed for mild pain (pain score 1-3) or headache.     apixaban  (ELIQUIS ) 5 MG TABS  tablet Take 5 mg by mouth 2 (two) times daily.     ascorbic acid  (VITAMIN C ) 500 MG tablet Take 500 mg by mouth 2 (two) times daily.     aspirin EC 81 MG tablet Take 81 mg by mouth at bedtime.     atorvastatin  (LIPITOR ) 80 MG tablet Take 80 mg by mouth at bedtime.     busPIRone  (BUSPAR ) 15 MG tablet Take 15 mg by mouth 2 (two) times daily.     diclofenac Sodium (VOLTAREN) 1 % GEL Apply 1 application topically 2 (two) times daily as needed (arthritis pain).     DULoxetine  (CYMBALTA ) 30 MG capsule Take 90 mg by mouth daily.     ezetimibe  (ZETIA ) 10 MG tablet Take 10 mg by  mouth in the morning.     flecainide (TAMBOCOR) 50 MG tablet Take 50 mg by mouth 2 (two) times daily.     furosemide  (LASIX ) 20 MG tablet Take 1 tablet (20 mg total) by mouth daily. (Patient taking differently: Take 20 mg by mouth daily as needed for fluid or edema (feet swelling).) 2 tablet 0   gabapentin  (NEURONTIN ) 300 MG capsule Take 300 mg by mouth 3 (three) times daily.     hydroxypropyl methylcellulose / hypromellose (ISOPTO TEARS / GONIOVISC) 2.5 % ophthalmic solution Place 1 drop into both eyes 3 (three) times daily as needed for dry eyes.     LUMIGAN 0.01 % SOLN Place 1 drop into both eyes at bedtime.     magnesium oxide (MAG-OX) 400 (240 Mg) MG tablet Take 400 mg by mouth at bedtime.     methenamine  (HIPREX) 1 g tablet Take 1 g by mouth 2 (two) times daily.     metoprolol tartrate (LOPRESSOR) 25 MG tablet Take 25 mg by mouth daily.     Multiple Vitamin (MULTIVITAMIN WITH MINERALS) TABS tablet Take 1 tablet by mouth daily.     naproxen sodium (ALEVE) 220 MG tablet Take 440 mg by mouth 2 (two) times daily as needed (pain).     ondansetron  (ZOFRAN ) 4 MG tablet Take 4 mg by mouth every 8 (eight) hours as needed for nausea or vomiting.     ORENCIA CLICKJECT 125 MG/ML SOAJ Inject 125 mg into the skin once a week. Inject subcutaneously on Sunday.     pantoprazole  (PROTONIX ) 40 MG tablet Take 40 mg by mouth every morning.     REPATHA SURECLICK 140 MG/ML SOAJ Inject 140 mg into the skin every 14 (fourteen) days. Inject subcutaneously every 14 days.     tiZANidine (ZANAFLEX) 2 MG tablet Take 2 mg by mouth in the morning and at bedtime. Take one tablet (2mg ) in the morning, and take two tablets (4mg ) at bedtime.      Home: Home Living Family/patient expects to be discharged to:: Private residence Living Arrangements:  (lives with daughter and grandchildren) Available Help at Discharge: Family, Available 24 hours/day Type of Home: House Home Access: Stairs to enter Secretary/administrator of  Steps: 1 Entrance Stairs-Rails: None Home Layout: Two level, Laundry or work area in basement, Able to live on main level with bedroom/bathroom Alternate Level Stairs-Number of Steps: flight Alternate Level Stairs-Rails: Right Bathroom Shower/Tub: Engineer, manufacturing systems: Standard Bathroom Accessibility: Yes Home Equipment: Hand held shower head, Grab bars - tub/shower, Grab bars - toilet Additional Comments: May have a w/c available  Lives With: Family  Functional History: Prior Function Prior Level of Function : Independent/Modified Independent, History of Falls (last six months) Mobility Comments: No  assistive device. 2 falls in last month; several falls over last year ADLs Comments: independent Functional Status:  Mobility: Bed Mobility Overal bed mobility: Needs Assistance Bed Mobility: Sit to Supine, Supine to Sit Supine to sit: Contact guard, Used rails, HOB elevated Sit to supine: Contact guard assist, HOB elevated, Used rails General bed mobility comments: increased time and effort Transfers Overall transfer level: Needs assistance Equipment used: Rolling walker (2 wheels) Transfers: Sit to/from Stand Sit to Stand: Min assist General transfer comment: Light min A from EOB and low toilet seat with cues for hand placement Ambulation/Gait Ambulation/Gait assistance: Min assist, Contact guard assist Gait Distance (Feet): 10 Feet (x2) Assistive device: Rolling walker (2 wheels) Gait Pattern/deviations: Step-through pattern, Decreased stride length, Decreased stance time - right, Knee flexed in stance - right, Trunk flexed General Gait Details: Slow gait with mild RLE instability requiring cues for quad contraction during stance and intermittent min A for stability. Cues for RW management with pt running into obstacles, increased R step length, and upright posture    ADL: ADL Overall ADL's : Needs assistance/impaired Eating/Feeding: Set up, Bed level Grooming:  Moderate assistance, Sitting Grooming Details (indicate cue type and reason): for bil UE tasks Upper Body Bathing: Moderate assistance, Sitting Lower Body Bathing: Maximal assistance, Sit to/from stand Upper Body Dressing : Moderate assistance, Sitting, Maximal assistance Lower Body Dressing: Maximal assistance, Sit to/from stand Toilet Transfer: Moderate assistance Functional mobility during ADLs: Moderate assistance  Cognition: Cognition Overall Cognitive Status: Within Functional Limits for tasks assessed Arousal/Alertness: Awake/alert Orientation Level: Oriented X4 Cognition Arousal: Alert Behavior During Therapy: WFL for tasks assessed/performed Overall Cognitive Status: Within Functional Limits for tasks assessed  Blood pressure (!) 169/90, pulse 69, temperature 97.9 F (36.6 C), temperature source Oral, resp. rate 18, height 5' 1 (1.549 m), weight 76.7 kg, SpO2 94%. Physical Exam Vitals and nursing note reviewed.  Constitutional:      Appearance: Normal appearance. She is obese.     Comments: Pt awake, alert, sitting up in bed; appears younger than stated age, NAD  HENT:     Head: Normocephalic and atraumatic.     Comments: Face symmetrical No sensory deficits on face Tongue midline    Right Ear: External ear normal.     Left Ear: External ear normal.     Nose: Nose normal. No congestion.     Mouth/Throat:     Mouth: Mucous membranes are dry.     Pharynx: Oropharynx is clear. No oropharyngeal exudate.  Eyes:     General:        Right eye: No discharge.        Left eye: No discharge.     Extraocular Movements: Extraocular movements intact.  Cardiovascular:     Rate and Rhythm: Normal rate and regular rhythm.     Heart sounds: Normal heart sounds. No murmur heard.    No gallop.  Pulmonary:     Effort: Pulmonary effort is normal. No respiratory distress.     Breath sounds: Normal breath sounds. No wheezing, rhonchi or rales.  Abdominal:     Palpations: Abdomen  is soft.     Comments: Somewhat protuberant vs distension- hypoactive BS  Musculoskeletal:     Cervical back: Neck supple.     Comments: RUE_ biceps 4-/5; Triceps 3-/5; WE 3-/5; Grip 2-/5 and FA 1/5 LUE 5/5 RLE- 4/5 throughout LLE- 5/5 Has RA changes in fingers/hands and feet  Skin:    General: Skin is warm and dry.  Comments: L forearm IV_ looks OK Was not able to assess skin on backside  Neurological:     Mental Status: She is alert and oriented to person, place, and time.     Comments: Intact to light touch of light touch in all 4 extremities Mild word finding deficits- chronic per pt No clonus; no increased tone; no hoffman's DTR's 1+ in Ue's and LEs  Psychiatric:        Mood and Affect: Mood normal.        Behavior: Behavior normal.     Results for orders placed or performed during the hospital encounter of 10/25/23 (from the past 24 hours)  Hemoglobin A1c     Status: None   Collection Time: 10/27/23 10:09 AM  Result Value Ref Range   Hgb A1c MFr Bld 5.6 4.8 - 5.6 %   Mean Plasma Glucose 114.02 mg/dL   ECHOCARDIOGRAM COMPLETE Result Date: 10/26/2023    ECHOCARDIOGRAM REPORT   Patient Name:   MARGERT EDSALL Date of Exam: 10/26/2023 Medical Rec #:  [id number]   Height:       61.0 in Accession #:    [id number]  Weight:       165.0 lb Date of Birth:  Aug 12, 1955   BSA:          1.740 m Patient Age:    68 years    BP:           176/72 mmHg Patient Gender: F           HR:           62 bpm. Exam Location:  Inpatient Procedure: 2D Echo, 3D Echo, Color Doppler, Cardiac Doppler and Strain Analysis            (Both Spectral and Color Flow Doppler were utilized during            procedure). Indications:     Stroke I63.9  History:         Patient has no prior history of Echocardiogram examinations.                  TIA; Risk Factors:Hypertension and Pre-diabetes.  Sonographer:     Koleen Popper RDCS Referring Phys:  [phone] ERIC LINDZEN Diagnosing Phys: Salena Negri MD  Sonographer Comments:  Global longitudinal strain was attempted. IMPRESSIONS  1. Left ventricular ejection fraction, by estimation, is 65 to 70%. The left ventricle has normal function. The left ventricle has no regional wall motion abnormalities. Left ventricular diastolic parameters are consistent with Grade I diastolic dysfunction (impaired relaxation). The global longitudinal strain is normal.  2. Right ventricular systolic function is normal. The right ventricular size is normal. There is normal pulmonary artery systolic pressure.  3. Left atrial size was mildly dilated.  4. Right atrial size was mildly dilated.  5. The mitral valve is normal in structure. Mild mitral valve regurgitation.  6. The aortic valve is tricuspid. Aortic valve regurgitation is trivial. Aortic valve sclerosis is present, with no evidence of aortic valve stenosis.  7. The inferior vena cava is normal in size with greater than 50% respiratory variability, suggesting right atrial pressure of 3 mmHg. FINDINGS  Left Ventricle: Left ventricular ejection fraction, by estimation, is 65 to 70%. The left ventricle has normal function. The left ventricle has no regional wall motion abnormalities. Strain was performed and the global longitudinal strain is normal. The  left ventricular internal cavity size was normal in size. There is borderline concentric left ventricular  hypertrophy. Left ventricular diastolic parameters are consistent with Grade I diastolic dysfunction (impaired relaxation). Right Ventricle: The right ventricular size is normal. No increase in right ventricular wall thickness. Right ventricular systolic function is normal. There is normal pulmonary artery systolic pressure. The tricuspid regurgitant velocity is 2.78 m/s, and  with an assumed right atrial pressure of 3 mmHg, the estimated right ventricular systolic pressure is 33.9 mmHg. Left Atrium: Left atrial size was mildly dilated. Right Atrium: Right atrial size was mildly dilated. Pericardium:  There is no evidence of pericardial effusion. Mitral Valve: The mitral valve is normal in structure. Mild mitral valve regurgitation. Tricuspid Valve: The tricuspid valve is normal in structure. Tricuspid valve regurgitation is mild. Aortic Valve: The aortic valve is tricuspid. Aortic valve regurgitation is trivial. Aortic valve sclerosis is present, with no evidence of aortic valve stenosis. Pulmonic Valve: The pulmonic valve was normal in structure. Pulmonic valve regurgitation is trivial. Aorta: The aortic root is normal in size and structure. Venous: The inferior vena cava is normal in size with greater than 50% respiratory variability, suggesting right atrial pressure of 3 mmHg. IAS/Shunts: No atrial level shunt detected by color flow Doppler. Additional Comments: 3D was performed not requiring image post processing on an independent workstation and was normal.  LEFT VENTRICLE PLAX 2D LVIDd:         4.20 cm   Diastology LVIDs:         2.50 cm   LV e' medial:    6.74 cm/s LV PW:         1.00 cm   LV E/e' medial:  9.5 LV IVS:        1.10 cm   LV e' lateral:   8.70 cm/s LVOT diam:     1.90 cm   LV E/e' lateral: 7.3 LV SV:         59 LV SV Index:   34 LVOT Area:     2.84 cm                           3D Volume EF:                          3D EF:        65 %                          LV EDV:       109 ml                          LV ESV:       38 ml                          LV SV:        71 ml RIGHT VENTRICLE             IVC RV S prime:     15.10 cm/s  IVC diam: 1.20 cm TAPSE (M-mode): 1.6 cm LEFT ATRIUM             Index LA diam:        4.40 cm 2.53 cm/m LA Vol (A2C):   35.4 ml 20.34 ml/m LA Vol (A4C):   41.1 ml 23.61 ml/m LA Biplane Vol: 38.7 ml 22.24 ml/m  AORTIC VALVE LVOT Vmax:  94.30 cm/s LVOT Vmean:  60.300 cm/s LVOT VTI:    0.209 m  AORTA Ao Root diam: 3.20 cm Ao Asc diam:  3.20 cm MITRAL VALVE               TRICUSPID VALVE MV Area (PHT): 3.53 cm    TR Peak grad:   30.9 mmHg MV Decel Time: 215 msec    TR  Vmax:        278.00 cm/s MV E velocity: 63.90 cm/s MV A velocity: 67.10 cm/s  SHUNTS MV E/A ratio:  0.95        Systemic VTI:  0.21 m                            Systemic Diam: 1.90 cm Salena Negri MD Electronically signed by Salena Negri MD Signature Date/Time: 10/26/2023/6:01:57 PM    Final    MR BRAIN WO CONTRAST Result Date: 10/26/2023 CLINICAL DATA:  Neuro deficit, acute, stroke suspected EXAM: MRI HEAD WITHOUT CONTRAST TECHNIQUE: Multiplanar, multiecho pulse sequences of the brain and surrounding structures were obtained without intravenous contrast. COMPARISON:  CT angiogram of the head dated October 25, 2023 and MRI of the head dated February 22, 2022. FINDINGS: Brain: There is restricted in diffusion involving the left precentral gyrus there is also a small acute subcortical lacunar infarct at the left temporal occipital junction, which is seen on image 69 of series 5. An additional more subtle focus of restricted diffusion is present within the left parietal white matter on image 79. There is age related volume loss and extensive diffuse cerebral white matter disease. There is no evidence of hemorrhage, mass or hydrocephalus. Vascular: Normal vascular flow voids. Skull and upper cervical spine: Normal marrow signal. No osseous lesions. Sinuses/Orbits: Clear paranasal sinuses. Status post bilateral lens replacement. Other: None. IMPRESSION: 1. Acute nonhemorrhagic infarct involving the left precentral gyrus. 2. Small lacunar white matter infarcts involving the left temporal occipital junction and the left parietal lobe. 3. Advanced cerebral white matter disease. Electronically Signed   By: Evalene Coho M.D.   On: 10/26/2023 12:20   CT ANGIO HEAD NECK W WO CM (CODE STROKE) Result Date: 10/25/2023 CLINICAL DATA:  Neuro deficit, acute, stroke suspected EXAM: CT ANGIOGRAPHY HEAD AND NECK WITH AND WITHOUT CONTRAST TECHNIQUE: Multidetector CT imaging of the head and neck was performed using the standard  protocol during bolus administration of intravenous contrast. Multiplanar CT image reconstructions and MIPs were obtained to evaluate the vascular anatomy. Carotid stenosis measurements (when applicable) are obtained utilizing NASCET criteria, using the distal internal carotid diameter as the denominator. RADIATION DOSE REDUCTION: This exam was performed according to the departmental dose-optimization program which includes automated exposure control, adjustment of the mA and/or kV according to patient size and/or use of iterative reconstruction technique. CONTRAST:  75mL OMNIPAQUE  IOHEXOL  350 MG/ML SOLN COMPARISON:  None Available. FINDINGS: CTA NECK FINDINGS Aortic arch: Great vessel origins are patent. Aortic atherosclerosis. Right carotid system: No evidence of dissection, stenosis (50% or greater), or occlusion. Left carotid system: Approximately 50% stenosis of the left proximal ICA due to atherosclerosis. Vertebral arteries: Severe left vertebral artery origin stenosis. Patent bilaterally. Right dominant. Skeleton: No evidence of acute abnormality on limited assessment. Other neck: No evidence of acute abnormality on limited assessment. Upper chest: Visualized lung apices are clear. Review of the MIP images confirms the above findings CTA HEAD FINDINGS Anterior circulation: Bilateral intracranial ICAs, MCAs, and are patent without proximal  hemodynamically significant stenosis. Posterior circulation: Bilateral intradural vertebral arteries, basilar artery and bilateral posterior arteries are patent without proximal 8 image least significant stenosis. Venous sinuses: As permitted by contrast timing, patent. Review of the MIP images confirms the above findings IMPRESSION: 1. No emergent large vessel occlusion. 2. Severe left vertebral artery origin stenosis. 3. 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 4. Aortic Atherosclerosis (ICD10-I70.0). Electronically Signed   By: Gilmore GORMAN Molt M.D.   On:  10/25/2023 22:53   CT HEAD CODE STROKE WO CONTRAST Result Date: 10/25/2023 CLINICAL DATA:  Code stroke.  Neuro deficit, acute, stroke suspected EXAM: CT HEAD WITHOUT CONTRAST TECHNIQUE: Contiguous axial images were obtained from the base of the skull through the vertex without intravenous contrast. RADIATION DOSE REDUCTION: This exam was performed according to the departmental dose-optimization program which includes automated exposure control, adjustment of the mA and/or kV according to patient size and/or use of iterative reconstruction technique. COMPARISON:  CT headMay 9, 2025. FINDINGS: Brain: Similar patchy hypodensities in the white matter and deep gray nuclei, compatible with chronic microvascular ischemic change. Small remote infarct in the left caudate superiorly. No evidence of acute large vascular territory infarct, acute hemorrhage, mass lesion or midline shift. Vascular: No hyperdense vessel. Skull: No acute fracture. Sinuses/Orbits: Clear sinuses.  No acute orbital findings. Other: No mastoid effusions. ASPECTS [facility] Stroke Program Early CT Score) Total score (0-10 with 10 being normal): 10. IMPRESSION: 1. No evidence of acute intracranial abnormality. ASPECTS is 10. 2. Chronic microvascular ischemic change. Code stroke imaging results were communicated on 10/25/2023 at 10:34 pm to provider Dr. Merrianne via secure text paging. Electronically Signed   By: Gilmore GORMAN Molt M.D.   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 Yes Co-Morbidities requiring supervision/potential complications: pAfib, on Eliquis ; now Pradaxa , RA, HTN, HLD; GERD, FMS; IBS-C, prior TIA 6/24; OAB, wears briefs; OA; Type 1 Obesity Due to bladder management, bowel management, safety,  skin/wound care, disease management, medication administration, pain management, and patient education, does the patient require 24 hr/day rehab nursing? Yes Does the patient require coordinated care of a physician, rehab nurse, therapy disciplines of PT and OT to address physical and functional deficits in the context of the above medical diagnosis(es)? Yes Addressing deficits in the following areas: balance, endurance, locomotion, strength, transferring, bowel/bladder control, bathing, dressing, feeding, grooming, and toileting Can the patient actively participate in an intensive therapy program of at least 3 hrs of therapy per day at least 5 days per week? Yes The potential for patient to make measurable gains while on inpatient rehab is good Anticipated functional outcomes upon discharge from inpatient rehab are modified independent and supervision  with PT, modified independent and supervision with OT, n/a with SLP. Estimated rehab length of stay to reach the above functional goals is: 7-10 days Anticipated discharge destination: Home Overall Rehab/Functional Prognosis: good  RECOMMENDATIONS: This patient's condition is appropriate for continued rehabilitative care in the following setting: CIR Patient has agreed to participate in recommended program. Yes Note that insurance prior authorization may be required for reimbursement for recommended care.  Comment:  Suggest Sorbitol  30cc x1 since LBM 3-4 days ago and feeling constipated- normally has IBS with Constipation and would suggest adding Senna-S for more long term, but needs to  be cleaned out.  Suggest trying Lidoderm  patches 2 patches for upper/mid back pain- was taking Oxycodone  at home, however when Oxy wasn't in UDS, was discharged fro [facility] pain clinic, of note.  Pt is a great candidate for inpt rehab- CIR- will have admissions coordinator submit for insurance.  4. Thank you for this consult.  Of note, word finding issues are  chronic per pt.   I spent a total of  53  minutes on total care today- >50% coordination of care- due to    D/w pt' snursing as well as review of chart, interview and exam, and documentation.   Virgil Lightner, MD 10/27/2023

## 2023-10-27 NOTE — Evaluation (Signed)
 Speech Language Pathology Evaluation Patient Details Name: Pearle Wandler MRN: [id number] DOB: 1955-12-09 Today's Date: 10/27/2023 Time: 1340-1405 SLP Time Calculation (min) (ACUTE ONLY): 25 min  Problem List:  Patient Active Problem List   Diagnosis Date Noted   Stroke-like symptoms 10/26/2023   Acute stroke due to ischemia [facility]) 10/26/2023   Past Medical History:  Past Medical History:  Diagnosis Date   A-fib (HCC)    Fibromyalgia    High cholesterol    Hypertension    Osteoarthritis    Prediabetes    Rheumatoid arthritis (HCC)    Past Surgical History:  Past Surgical History:  Procedure Laterality Date   BACK SURGERY     CATARACT EXTRACTION Bilateral    TONSILLECTOMY     HPI:  68yo female admitted 10/25/23 with right side weakness. PMH: PAFib, HTN, HLD, RA, Fibromyalgia, GERD, IBS, chronic anxiety/depression, CVA.MRI - acute infarct left precentral gyrus, small lacunar white matter infarct left temporo-occipital junction and left parietal lobe.   Assessment / Plan / Recommendation Clinical Impression  Pt seen at bedside for cognitive linguistic assessment. She is awake and alert, pleasant and cooperative. She reports having a college degree. She lives with her daughter and 3 grandchildren. Pt and her daughter share management of finances and household responsibilities. She is independent with medications. Pt speech is fully intelligible, CN exam unremarkable. Receptive and Expressive Language are intact, although pt reports some word finding difficulty prior to admit. No issues were noted during this assessment. The [employer] Mental Status (SLUMS) Examination was administered today. Ptscored 29/30, indicating performance WFL. Further acute ST not recommended at this time. If needs arise, reconsult ST at next venue of care.    SLP Assessment  SLP Recommendation/Assessment: All further Speech Language Pathology needs can be addressed in the next venue of care (if needs  arise) SLP Visit Diagnosis: Cognitive communication deficit (R41.841)     Assistance Recommended at Discharge  Other (comment) (TBD)  Functional Status Assessment Patient has not had a recent decline in their functional status     SLP Evaluation Cognition  Overall Cognitive Status: Within Functional Limits for tasks assessed Arousal/Alertness: Awake/alert Orientation Level: Oriented X4       Comprehension  Auditory Comprehension Overall Auditory Comprehension: Appears within functional limits for tasks assessed    Expression Expression Primary Mode of Expression: Verbal Verbal Expression Overall Verbal Expression: Appears within functional limits for tasks assessed Written Expression Dominant Hand: Right   Oral / Motor  Oral Motor/Sensory Function Overall Oral Motor/Sensory Function: Within functional limits Motor Speech Overall Motor Speech: Appears within functional limits for tasks assessed           Brinly Maietta B. Dory, MSP, CCC-SLP Speech Language Pathologist Office: [phone]  Dory Caprice Daring 10/27/2023, 2:25 PM

## 2023-10-28 ENCOUNTER — Other Ambulatory Visit: Payer: Self-pay

## 2023-10-28 ENCOUNTER — Inpatient Hospital Stay (HOSPITAL_COMMUNITY)
Admission: AD | Admit: 2023-10-28 | Discharge: 2023-11-11 | DRG: 057 | Disposition: A | Discharge status: Home / Self Care | Source: Intra-hospital | Attending: Physical Medicine & Rehabilitation | Admitting: Physical Medicine & Rehabilitation

## 2023-10-28 ENCOUNTER — Encounter (HOSPITAL_COMMUNITY): Payer: Self-pay | Admitting: Physical Medicine & Rehabilitation

## 2023-10-28 DIAGNOSIS — Z9109 Other allergy status, other than to drugs and biological substances: Secondary | ICD-10-CM

## 2023-10-28 DIAGNOSIS — Z6831 Body mass index (BMI) 31.0-31.9, adult: Secondary | ICD-10-CM

## 2023-10-28 DIAGNOSIS — M1990 Unspecified osteoarthritis, unspecified site: Secondary | ICD-10-CM | POA: Diagnosis present

## 2023-10-28 DIAGNOSIS — Z888 Allergy status to other drugs, medicaments and biological substances status: Secondary | ICD-10-CM | POA: Diagnosis not present

## 2023-10-28 DIAGNOSIS — L89322 Pressure ulcer of left buttock, stage 2: Secondary | ICD-10-CM | POA: Diagnosis present

## 2023-10-28 DIAGNOSIS — E7800 Pure hypercholesterolemia, unspecified: Secondary | ICD-10-CM | POA: Diagnosis present

## 2023-10-28 DIAGNOSIS — F331 Major depressive disorder, recurrent, moderate: Secondary | ICD-10-CM | POA: Diagnosis present

## 2023-10-28 DIAGNOSIS — D649 Anemia, unspecified: Secondary | ICD-10-CM | POA: Diagnosis present

## 2023-10-28 DIAGNOSIS — Z79899 Other long term (current) drug therapy: Secondary | ICD-10-CM

## 2023-10-28 DIAGNOSIS — Z7982 Long term (current) use of aspirin: Secondary | ICD-10-CM | POA: Diagnosis not present

## 2023-10-28 DIAGNOSIS — I639 Cerebral infarction, unspecified: Secondary | ICD-10-CM | POA: Diagnosis not present

## 2023-10-28 DIAGNOSIS — I69351 Hemiplegia and hemiparesis following cerebral infarction affecting right dominant side: Principal | ICD-10-CM

## 2023-10-28 DIAGNOSIS — E669 Obesity, unspecified: Secondary | ICD-10-CM | POA: Diagnosis present

## 2023-10-28 DIAGNOSIS — M069 Rheumatoid arthritis, unspecified: Secondary | ICD-10-CM | POA: Diagnosis present

## 2023-10-28 DIAGNOSIS — K5900 Constipation, unspecified: Secondary | ICD-10-CM | POA: Diagnosis present

## 2023-10-28 DIAGNOSIS — I10 Essential (primary) hypertension: Secondary | ICD-10-CM | POA: Diagnosis present

## 2023-10-28 DIAGNOSIS — G8191 Hemiplegia, unspecified affecting right dominant side: Secondary | ICD-10-CM | POA: Diagnosis not present

## 2023-10-28 DIAGNOSIS — K219 Gastro-esophageal reflux disease without esophagitis: Secondary | ICD-10-CM | POA: Diagnosis present

## 2023-10-28 DIAGNOSIS — Z7901 Long term (current) use of anticoagulants: Secondary | ICD-10-CM | POA: Diagnosis not present

## 2023-10-28 DIAGNOSIS — I480 Paroxysmal atrial fibrillation: Secondary | ICD-10-CM | POA: Diagnosis present

## 2023-10-28 DIAGNOSIS — G43909 Migraine, unspecified, not intractable, without status migrainosus: Secondary | ICD-10-CM | POA: Diagnosis present

## 2023-10-28 DIAGNOSIS — M797 Fibromyalgia: Secondary | ICD-10-CM | POA: Diagnosis present

## 2023-10-28 DIAGNOSIS — E782 Mixed hyperlipidemia: Secondary | ICD-10-CM | POA: Diagnosis not present

## 2023-10-28 DIAGNOSIS — L89312 Pressure ulcer of right buttock, stage 2: Secondary | ICD-10-CM | POA: Diagnosis present

## 2023-10-28 DIAGNOSIS — F411 Generalized anxiety disorder: Secondary | ICD-10-CM | POA: Diagnosis present

## 2023-10-28 DIAGNOSIS — I4891 Unspecified atrial fibrillation: Secondary | ICD-10-CM

## 2023-10-28 DIAGNOSIS — M0520 Rheumatoid vasculitis with rheumatoid arthritis of unspecified site: Secondary | ICD-10-CM

## 2023-10-28 DIAGNOSIS — G894 Chronic pain syndrome: Secondary | ICD-10-CM | POA: Diagnosis not present

## 2023-10-28 DIAGNOSIS — I482 Chronic atrial fibrillation, unspecified: Secondary | ICD-10-CM | POA: Diagnosis not present

## 2023-10-28 MED ORDER — DABIGATRAN ETEXILATE MESYLATE 150 MG PO CAPS: 150.0000 mg | ORAL_CAPSULE | Freq: Two times a day (BID) | ORAL | Status: DC

## 2023-10-28 MED ORDER — MELATONIN 5 MG PO TABS
5.0000 mg | ORAL_TABLET | Freq: Every evening | ORAL | Status: DC | PRN
  Administered 2023-11-06 – 2023-11-10 (×5): 5 mg via ORAL
  Filled 2023-10-28 (×5): qty 1

## 2023-10-28 MED ORDER — SORBITOL 70 % SOLN
30.0000 mL | Freq: Every day | Status: DC | PRN
  Administered 2023-11-02: 30 mL via ORAL
  Filled 2023-10-28: qty 30

## 2023-10-28 MED ORDER — GABAPENTIN 100 MG PO CAPS
100.0000 mg | ORAL_CAPSULE | Freq: Three times a day (TID) | ORAL | Status: DC
  Administered 2023-10-28 – 2023-11-02 (×14): 100 mg via ORAL
  Filled 2023-10-28 (×14): qty 1

## 2023-10-28 MED ORDER — ATORVASTATIN CALCIUM 80 MG PO TABS
80.0000 mg | ORAL_TABLET | Freq: Every day | ORAL | Status: DC
  Administered 2023-10-29 – 2023-11-11 (×14): 80 mg via ORAL
  Filled 2023-10-28 (×14): qty 1

## 2023-10-28 MED ORDER — SORBITOL 70 % SOLN
30.0000 mL | Freq: Every day | Status: DC | PRN
  Administered 2023-10-28: 30 mL via ORAL
  Filled 2023-10-28 (×2): qty 30

## 2023-10-28 MED ORDER — EZETIMIBE 10 MG PO TABS
10.0000 mg | ORAL_TABLET | Freq: Every day | ORAL | Status: DC
  Administered 2023-10-29 – 2023-11-11 (×14): 10 mg via ORAL
  Filled 2023-10-28 (×14): qty 1

## 2023-10-28 MED ORDER — OXYCODONE HCL 5 MG PO TABS
5.0000 mg | ORAL_TABLET | ORAL | Status: DC | PRN
  Administered 2023-10-29 – 2023-10-30 (×4): 5 mg via ORAL
  Filled 2023-10-28 (×4): qty 1

## 2023-10-28 MED ORDER — PANTOPRAZOLE SODIUM 40 MG PO TBEC
40.0000 mg | DELAYED_RELEASE_TABLET | Freq: Every morning | ORAL | Status: DC
  Administered 2023-10-29 – 2023-11-10 (×13): 40 mg via ORAL
  Filled 2023-10-28 (×13): qty 1

## 2023-10-28 MED ORDER — DULOXETINE HCL 60 MG PO CPEP
90.0000 mg | ORAL_CAPSULE | Freq: Every day | ORAL | Status: DC
  Administered 2023-10-29 – 2023-11-11 (×14): 90 mg via ORAL
  Filled 2023-10-28 (×14): qty 1

## 2023-10-28 MED ORDER — ACETAMINOPHEN 325 MG PO TABS
650.0000 mg | ORAL_TABLET | Freq: Four times a day (QID) | ORAL | Status: DC | PRN
  Administered 2023-10-29 – 2023-11-06 (×8): 650 mg via ORAL
  Filled 2023-10-28 (×8): qty 2

## 2023-10-28 MED ORDER — POLYETHYLENE GLYCOL 3350 17 G PO PACK
17.0000 g | PACK | Freq: Every day | ORAL | Status: DC | PRN
  Administered 2023-11-02: 17 g via ORAL

## 2023-10-28 MED ORDER — DABIGATRAN ETEXILATE MESYLATE 150 MG PO CAPS
150.0000 mg | ORAL_CAPSULE | Freq: Two times a day (BID) | ORAL | Status: DC
  Administered 2023-10-28 – 2023-11-11 (×28): 150 mg via ORAL
  Filled 2023-10-28 (×29): qty 1

## 2023-10-28 MED ORDER — BUSPIRONE HCL 15 MG PO TABS
15.0000 mg | ORAL_TABLET | Freq: Two times a day (BID) | ORAL | Status: DC
  Administered 2023-10-28 – 2023-11-11 (×28): 15 mg via ORAL
  Filled 2023-10-28 (×28): qty 1

## 2023-10-28 NOTE — Progress Notes (Addendum)
   Inpatient Rehabilitation Admissions Coordinator   I await insurance approval for possible CIR admit. I met with patient at bedside and reviewed estimated cost of CIR if approved. She is in agreement.  Heron Leavell, RN, MSN Rehab Admissions Coordinator [phone] 10/28/2023 11:44 AM   I have insurance approval and CIR bed to admit her to today. Acute team and TOC made aware. I will make the arrangements.  Heron Leavell, RN, MSN Rehab Admissions Coordinator [phone] 10/28/2023 3:21 PM

## 2023-10-28 NOTE — Progress Notes (Signed)
 Cornelio Bouchard, MD  Physician Physical Medicine and Rehabilitation   Consult Note    Signed   Date of Service: 10/27/2023  5:53 PM  Related encounter: ED to Hosp-Admission (Discharged) from 10/25/2023 in [facility]   Signed     Expand All Collapse All  Show:Clear all [x] Written[x] Templated[] Copied  Added by: [x] Lovorn, Megan, MD  [] Hover for details          Physical Medicine and Rehabilitation Consult Reason for Consult:CIR Referring Physician: Dr Merl     HPI: Holly Henry is a 68 y.o.  R handed female  with hx of: pAfib, on Eliquis  that she's compliant with at home; HTN, HLD, RA, GERD, IBS-C; depression, anxiety, Fibromyalgia, TIA 6/24; OA, OAB uses briefs admitted 10/25/23 as a code stroke- she had R sided hemiparesis and NIHSS was 4.  She underwent MRI- which showed: multiple L cerebral infarcts-- Lparietal and L precentral gyrus as well as white matter infarcts. Also found on CTA to have:  L vertebral artery severe stenosis and 50% stenosis of L proximal ICA.    She was changed to Pradaxa  from Eliquis - and maximized her other meds.    Her LBM was 3-4 days ago.  She hasn't had one since admission.    She's voiding OK, but has chronic overactive bladder and wears briefs at home.    For pain, she's always in pain- and used to take Oxycodone  a couple of times per day, however her pain doctor didn't get a positive UDS so discharged her. She also was taking Aleve or Motrin with her Eliquis , of note and tylenol  as well. She's tried lidoderm  for her knees- wasn't helpful.  She's currently complaining of new upper/mid back pain- really painful- occurred since she was admitted. Also has OA and RA joint pain.   She also notes she easily gets skin breakdown on her buttocks due to OAB    Social Hx: Pt lives with her daughters who doesn't work- she's doing ok physically, but applying for disability due to Rheum disease- she was independent before  her admission.      Review of Systems  Constitutional:  Positive for malaise/fatigue.  HENT: Negative.    Eyes: Negative.   Respiratory: Negative.    Cardiovascular:  Negative for chest pain and leg swelling.  Gastrointestinal:  Positive for constipation.  Genitourinary:  Positive for frequency.       OAB  Musculoskeletal:  Positive for back pain, joint pain and myalgias.  Skin:        Thinks she has skin breakdown on buttocks  Neurological:  Positive for focal weakness and weakness. Negative for sensory change and speech change.  Endo/Heme/Allergies: Negative.   Psychiatric/Behavioral:  Positive for depression. The patient is nervous/anxious.   All other systems reviewed and are negative.      Past Medical History:  Diagnosis Date   A-fib (HCC)     Fibromyalgia     High cholesterol     Hypertension     Osteoarthritis     Prediabetes     Rheumatoid arthritis (HCC)               Past Surgical History:  Procedure Laterality Date   BACK SURGERY       CATARACT EXTRACTION Bilateral     TONSILLECTOMY            History reviewed. No pertinent family history.     Social History:  reports that she has never smoked.  She has never used smokeless tobacco. She reports that she does not drink alcohol and does not use drugs. Allergies:  Allergies       Allergies  Allergen Reactions   Gramineae Pollens Dermatitis      Dust, pollen   redtop grass pollen extract   [place] Grass Extract Cough   Buprenorphine Hcl Other (See Comments)      Other Reaction(s): Mental Status Changes, Psychosis   Pollen Extract Dermatitis      Dust, pollen            Medications Prior to Admission  Medication Sig Dispense Refill   acetaminophen  (TYLENOL ) 500 MG tablet Take 1,000 mg by mouth every 6 (six) hours as needed for mild pain (pain score 1-3) or headache.       apixaban  (ELIQUIS ) 5 MG TABS tablet Take 5 mg by mouth 2 (two) times daily.       ascorbic acid  (VITAMIN C ) 500 MG tablet  Take 500 mg by mouth 2 (two) times daily.       aspirin EC 81 MG tablet Take 81 mg by mouth at bedtime.       atorvastatin  (LIPITOR ) 80 MG tablet Take 80 mg by mouth at bedtime.       busPIRone  (BUSPAR ) 15 MG tablet Take 15 mg by mouth 2 (two) times daily.       diclofenac Sodium (VOLTAREN) 1 % GEL Apply 1 application topically 2 (two) times daily as needed (arthritis pain).       DULoxetine  (CYMBALTA ) 30 MG capsule Take 90 mg by mouth daily.       ezetimibe  (ZETIA ) 10 MG tablet Take 10 mg by mouth in the morning.       flecainide (TAMBOCOR) 50 MG tablet Take 50 mg by mouth 2 (two) times daily.       furosemide  (LASIX ) 20 MG tablet Take 1 tablet (20 mg total) by mouth daily. (Patient taking differently: Take 20 mg by mouth daily as needed for fluid or edema (feet swelling).) 2 tablet 0   gabapentin  (NEURONTIN ) 300 MG capsule Take 300 mg by mouth 3 (three) times daily.       hydroxypropyl methylcellulose / hypromellose (ISOPTO TEARS / GONIOVISC) 2.5 % ophthalmic solution Place 1 drop into both eyes 3 (three) times daily as needed for dry eyes.       LUMIGAN 0.01 % SOLN Place 1 drop into both eyes at bedtime.       magnesium oxide (MAG-OX) 400 (240 Mg) MG tablet Take 400 mg by mouth at bedtime.       methenamine  (HIPREX) 1 g tablet Take 1 g by mouth 2 (two) times daily.       metoprolol tartrate (LOPRESSOR) 25 MG tablet Take 25 mg by mouth daily.       Multiple Vitamin (MULTIVITAMIN WITH MINERALS) TABS tablet Take 1 tablet by mouth daily.       naproxen sodium (ALEVE) 220 MG tablet Take 440 mg by mouth 2 (two) times daily as needed (pain).       ondansetron  (ZOFRAN ) 4 MG tablet Take 4 mg by mouth every 8 (eight) hours as needed for nausea or vomiting.       ORENCIA CLICKJECT 125 MG/ML SOAJ Inject 125 mg into the skin once a week. Inject subcutaneously on Sunday.       pantoprazole  (PROTONIX ) 40 MG tablet Take 40 mg by mouth every morning.       REPATHA SURECLICK 140 MG/ML SOAJ  Inject 140 mg into  the skin every 14 (fourteen) days. Inject subcutaneously every 14 days.       tiZANidine (ZANAFLEX) 2 MG tablet Take 2 mg by mouth in the morning and at bedtime. Take one tablet (2mg ) in the morning, and take two tablets (4mg ) at bedtime.              Home: Home Living Family/patient expects to be discharged to:: Private residence Living Arrangements:  (lives with daughter and grandchildren) Available Help at Discharge: Family, Available 24 hours/day Type of Home: House Home Access: Stairs to enter Secretary/administrator of Steps: 1 Entrance Stairs-Rails: None Home Layout: Two level, Laundry or work area in basement, Able to live on main level with bedroom/bathroom Alternate Level Stairs-Number of Steps: flight Alternate Level Stairs-Rails: Right Bathroom Shower/Tub: Engineer, manufacturing systems: Standard Bathroom Accessibility: Yes Home Equipment: Hand held shower head, Grab bars - tub/shower, Grab bars - toilet Additional Comments: May have a w/c available  Lives With: Family  Functional History: Prior Function Prior Level of Function : Independent/Modified Independent, History of Falls (last six months) Mobility Comments: No assistive device. 2 falls in last month; several falls over last year ADLs Comments: independent Functional Status:  Mobility: Bed Mobility Overal bed mobility: Needs Assistance Bed Mobility: Sit to Supine, Supine to Sit Supine to sit: Contact guard, Used rails, HOB elevated Sit to supine: Contact guard assist, HOB elevated, Used rails General bed mobility comments: increased time and effort Transfers Overall transfer level: Needs assistance Equipment used: Rolling walker (2 wheels) Transfers: Sit to/from Stand Sit to Stand: Min assist General transfer comment: Light min A from EOB and low toilet seat with cues for hand placement Ambulation/Gait Ambulation/Gait assistance: Min assist, Contact guard assist Gait Distance (Feet): 10 Feet  (x2) Assistive device: Rolling walker (2 wheels) Gait Pattern/deviations: Step-through pattern, Decreased stride length, Decreased stance time - right, Knee flexed in stance - right, Trunk flexed General Gait Details: Slow gait with mild RLE instability requiring cues for quad contraction during stance and intermittent min A for stability. Cues for RW management with pt running into obstacles, increased R step length, and upright posture   ADL: ADL Overall ADL's : Needs assistance/impaired Eating/Feeding: Set up, Bed level Grooming: Moderate assistance, Sitting Grooming Details (indicate cue type and reason): for bil UE tasks Upper Body Bathing: Moderate assistance, Sitting Lower Body Bathing: Maximal assistance, Sit to/from stand Upper Body Dressing : Moderate assistance, Sitting, Maximal assistance Lower Body Dressing: Maximal assistance, Sit to/from stand Toilet Transfer: Moderate assistance Functional mobility during ADLs: Moderate assistance   Cognition: Cognition Overall Cognitive Status: Within Functional Limits for tasks assessed Arousal/Alertness: Awake/alert Orientation Level: Oriented X4 Cognition Arousal: Alert Behavior During Therapy: WFL for tasks assessed/performed Overall Cognitive Status: Within Functional Limits for tasks assessed   Blood pressure (!) 169/90, pulse 69, temperature 97.9 F (36.6 C), temperature source Oral, resp. rate 18, height 5' 1 (1.549 m), weight 76.7 kg, SpO2 94%. Physical Exam Vitals and nursing note reviewed.  Constitutional:      Appearance: Normal appearance. She is obese.     Comments: Pt awake, alert, sitting up in bed; appears younger than stated age, NAD  HENT:     Head: Normocephalic and atraumatic.     Comments: Face symmetrical No sensory deficits on face Tongue midline    Right Ear: External ear normal.     Left Ear: External ear normal.     Nose: Nose normal. No congestion.  Mouth/Throat:     Mouth: Mucous membranes  are dry.     Pharynx: Oropharynx is clear. No oropharyngeal exudate.  Eyes:     General:        Right eye: No discharge.        Left eye: No discharge.     Extraocular Movements: Extraocular movements intact.  Cardiovascular:     Rate and Rhythm: Normal rate and regular rhythm.     Heart sounds: Normal heart sounds. No murmur heard.    No gallop.  Pulmonary:     Effort: Pulmonary effort is normal. No respiratory distress.     Breath sounds: Normal breath sounds. No wheezing, rhonchi or rales.  Abdominal:     Palpations: Abdomen is soft.     Comments: Somewhat protuberant vs distension- hypoactive BS  Musculoskeletal:     Cervical back: Neck supple.     Comments: RUE_ biceps 4-/5; Triceps 3-/5; WE 3-/5; Grip 2-/5 and FA 1/5 LUE 5/5 RLE- 4/5 throughout LLE- 5/5 Has RA changes in fingers/hands and feet  Skin:    General: Skin is warm and dry.     Comments: L forearm IV_ looks OK Was not able to assess skin on backside  Neurological:     Mental Status: She is alert and oriented to person, place, and time.     Comments: Intact to light touch of light touch in all 4 extremities Mild word finding deficits- chronic per pt No clonus; no increased tone; no hoffman's DTR's 1+ in Ue's and LEs  Psychiatric:        Mood and Affect: Mood normal.        Behavior: Behavior normal.       Lab Results Last 24 Hours       Results for orders placed or performed during the hospital encounter of 10/25/23 (from the past 24 hours)  Hemoglobin A1c     Status: None    Collection Time: 10/27/23 10:09 AM  Result Value Ref Range    Hgb A1c MFr Bld 5.6 4.8 - 5.6 %    Mean Plasma Glucose 114.02 mg/dL       Imaging Results (Last 48 hours)  ECHOCARDIOGRAM COMPLETE Result Date: 10/26/2023    ECHOCARDIOGRAM REPORT   Patient Name:   KENTRELL GUETTLER Date of Exam: 10/26/2023 Medical Rec #:  [id number]   Height:       61.0 in Accession #:    [id number]  Weight:       165.0 lb Date of Birth:  1955-06-24   BSA:           1.740 m Patient Age:    68 years    BP:           176/72 mmHg Patient Gender: F           HR:           62 bpm. Exam Location:  Inpatient Procedure: 2D Echo, 3D Echo, Color Doppler, Cardiac Doppler and Strain Analysis            (Both Spectral and Color Flow Doppler were utilized during            procedure). Indications:     Stroke I63.9  History:         Patient has no prior history of Echocardiogram examinations.                  TIA; Risk Factors:Hypertension and Pre-diabetes.  Sonographer:  Koleen Popper RDCS Referring Phys:  [id number] ERIC LINDZEN Diagnosing Phys: Salena Negri MD  Sonographer Comments: Global longitudinal strain was attempted. IMPRESSIONS  1. Left ventricular ejection fraction, by estimation, is 65 to 70%. The left ventricle has normal function. The left ventricle has no regional wall motion abnormalities. Left ventricular diastolic parameters are consistent with Grade I diastolic dysfunction (impaired relaxation). The global longitudinal strain is normal.  2. Right ventricular systolic function is normal. The right ventricular size is normal. There is normal pulmonary artery systolic pressure.  3. Left atrial size was mildly dilated.  4. Right atrial size was mildly dilated.  5. The mitral valve is normal in structure. Mild mitral valve regurgitation.  6. The aortic valve is tricuspid. Aortic valve regurgitation is trivial. Aortic valve sclerosis is present, with no evidence of aortic valve stenosis.  7. The inferior vena cava is normal in size with greater than 50% respiratory variability, suggesting right atrial pressure of 3 mmHg. FINDINGS  Left Ventricle: Left ventricular ejection fraction, by estimation, is 65 to 70%. The left ventricle has normal function. The left ventricle has no regional wall motion abnormalities. Strain was performed and the global longitudinal strain is normal. The  left ventricular internal cavity size was normal in size. There is borderline concentric left  ventricular hypertrophy. Left ventricular diastolic parameters are consistent with Grade I diastolic dysfunction (impaired relaxation). Right Ventricle: The right ventricular size is normal. No increase in right ventricular wall thickness. Right ventricular systolic function is normal. There is normal pulmonary artery systolic pressure. The tricuspid regurgitant velocity is 2.78 m/s, and  with an assumed right atrial pressure of 3 mmHg, the estimated right ventricular systolic pressure is 33.9 mmHg. Left Atrium: Left atrial size was mildly dilated. Right Atrium: Right atrial size was mildly dilated. Pericardium: There is no evidence of pericardial effusion. Mitral Valve: The mitral valve is normal in structure. Mild mitral valve regurgitation. Tricuspid Valve: The tricuspid valve is normal in structure. Tricuspid valve regurgitation is mild. Aortic Valve: The aortic valve is tricuspid. Aortic valve regurgitation is trivial. Aortic valve sclerosis is present, with no evidence of aortic valve stenosis. Pulmonic Valve: The pulmonic valve was normal in structure. Pulmonic valve regurgitation is trivial. Aorta: The aortic root is normal in size and structure. Venous: The inferior vena cava is normal in size with greater than 50% respiratory variability, suggesting right atrial pressure of 3 mmHg. IAS/Shunts: No atrial level shunt detected by color flow Doppler. Additional Comments: 3D was performed not requiring image post processing on an independent workstation and was normal.  LEFT VENTRICLE PLAX 2D LVIDd:         4.20 cm   Diastology LVIDs:         2.50 cm   LV e' medial:    6.74 cm/s LV PW:         1.00 cm   LV E/e' medial:  9.5 LV IVS:        1.10 cm   LV e' lateral:   8.70 cm/s LVOT diam:     1.90 cm   LV E/e' lateral: 7.3 LV SV:         59 LV SV Index:   34 LVOT Area:     2.84 cm                           3D Volume EF:  3D EF:        65 %                          LV EDV:       109 ml                           LV ESV:       38 ml                          LV SV:        71 ml RIGHT VENTRICLE             IVC RV S prime:     15.10 cm/s  IVC diam: 1.20 cm TAPSE (M-mode): 1.6 cm LEFT ATRIUM             Index LA diam:        4.40 cm 2.53 cm/m LA Vol (A2C):   35.4 ml 20.34 ml/m LA Vol (A4C):   41.1 ml 23.61 ml/m LA Biplane Vol: 38.7 ml 22.24 ml/m  AORTIC VALVE LVOT Vmax:   94.30 cm/s LVOT Vmean:  60.300 cm/s LVOT VTI:    0.209 m  AORTA Ao Root diam: 3.20 cm Ao Asc diam:  3.20 cm MITRAL VALVE               TRICUSPID VALVE MV Area (PHT): 3.53 cm    TR Peak grad:   30.9 mmHg MV Decel Time: 215 msec    TR Vmax:        278.00 cm/s MV E velocity: 63.90 cm/s MV A velocity: 67.10 cm/s  SHUNTS MV E/A ratio:  0.95        Systemic VTI:  0.21 m                            Systemic Diam: 1.90 cm Salena Negri MD Electronically signed by Salena Negri MD Signature Date/Time: 10/26/2023/6:01:57 PM    Final     MR BRAIN WO CONTRAST Result Date: 10/26/2023 CLINICAL DATA:  Neuro deficit, acute, stroke suspected EXAM: MRI HEAD WITHOUT CONTRAST TECHNIQUE: Multiplanar, multiecho pulse sequences of the brain and surrounding structures were obtained without intravenous contrast. COMPARISON:  CT angiogram of the head dated October 25, 2023 and MRI of the head dated February 22, 2022. FINDINGS: Brain: There is restricted in diffusion involving the left precentral gyrus there is also a small acute subcortical lacunar infarct at the left temporal occipital junction, which is seen on image 69 of series 5. An additional more subtle focus of restricted diffusion is present within the left parietal white matter on image 79. There is age related volume loss and extensive diffuse cerebral white matter disease. There is no evidence of hemorrhage, mass or hydrocephalus. Vascular: Normal vascular flow voids. Skull and upper cervical spine: Normal marrow signal. No osseous lesions. Sinuses/Orbits: Clear paranasal sinuses. Status post bilateral  lens replacement. Other: None. IMPRESSION: 1. Acute nonhemorrhagic infarct involving the left precentral gyrus. 2. Small lacunar white matter infarcts involving the left temporal occipital junction and the left parietal lobe. 3. Advanced cerebral white matter disease. Electronically Signed   By: Evalene Coho M.D.   On: 10/26/2023 12:20    CT ANGIO HEAD NECK W WO CM (CODE STROKE) Result Date: 10/25/2023 CLINICAL DATA:  Neuro deficit, acute,  stroke suspected EXAM: CT ANGIOGRAPHY HEAD AND NECK WITH AND WITHOUT CONTRAST TECHNIQUE: Multidetector CT imaging of the head and neck was performed using the standard protocol during bolus administration of intravenous contrast. Multiplanar CT image reconstructions and MIPs were obtained to evaluate the vascular anatomy. Carotid stenosis measurements (when applicable) are obtained utilizing NASCET criteria, using the distal internal carotid diameter as the denominator. RADIATION DOSE REDUCTION: This exam was performed according to the departmental dose-optimization program which includes automated exposure control, adjustment of the mA and/or kV according to patient size and/or use of iterative reconstruction technique. CONTRAST:  75mL OMNIPAQUE  IOHEXOL  350 MG/ML SOLN COMPARISON:  None Available. FINDINGS: CTA NECK FINDINGS Aortic arch: Great vessel origins are patent. Aortic atherosclerosis. Right carotid system: No evidence of dissection, stenosis (50% or greater), or occlusion. Left carotid system: Approximately 50% stenosis of the left proximal ICA due to atherosclerosis. Vertebral arteries: Severe left vertebral artery origin stenosis. Patent bilaterally. Right dominant. Skeleton: No evidence of acute abnormality on limited assessment. Other neck: No evidence of acute abnormality on limited assessment. Upper chest: Visualized lung apices are clear. Review of the MIP images confirms the above findings CTA HEAD FINDINGS Anterior circulation: Bilateral intracranial  ICAs, MCAs, and are patent without proximal hemodynamically significant stenosis. Posterior circulation: Bilateral intradural vertebral arteries, basilar artery and bilateral posterior arteries are patent without proximal 8 image least significant stenosis. Venous sinuses: As permitted by contrast timing, patent. Review of the MIP images confirms the above findings IMPRESSION: 1. No emergent large vessel occlusion. 2. Severe left vertebral artery origin stenosis. 3. 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 4. Aortic Atherosclerosis (ICD10-I70.0). Electronically Signed   By: Gilmore GORMAN Molt M.D.   On: 10/25/2023 22:53    CT HEAD CODE STROKE WO CONTRAST Result Date: 10/25/2023 CLINICAL DATA:  Code stroke.  Neuro deficit, acute, stroke suspected EXAM: CT HEAD WITHOUT CONTRAST TECHNIQUE: Contiguous axial images were obtained from the base of the skull through the vertex without intravenous contrast. RADIATION DOSE REDUCTION: This exam was performed according to the departmental dose-optimization program which includes automated exposure control, adjustment of the mA and/or kV according to patient size and/or use of iterative reconstruction technique. COMPARISON:  CT headMay 9, 2025. FINDINGS: Brain: Similar patchy hypodensities in the white matter and deep gray nuclei, compatible with chronic microvascular ischemic change. Small remote infarct in the left caudate superiorly. No evidence of acute large vascular territory infarct, acute hemorrhage, mass lesion or midline shift. Vascular: No hyperdense vessel. Skull: No acute fracture. Sinuses/Orbits: Clear sinuses.  No acute orbital findings. Other: No mastoid effusions. ASPECTS [facility] Stroke Program Early CT Score) Total score (0-10 with 10 being normal): 10. IMPRESSION: 1. No evidence of acute intracranial abnormality. ASPECTS is 10. 2. Chronic microvascular ischemic change. Code stroke imaging results were communicated on 10/25/2023 at 10:34 pm to  provider Dr. Merrianne via secure text paging. Electronically Signed   By: Gilmore GORMAN Molt M.D.   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 Yes Co-Morbidities requiring supervision/potential complications: pAfib, on Eliquis ; now Pradaxa , RA, HTN, HLD; GERD, FMS; IBS-C, prior TIA 6/24; OAB, wears briefs; OA; Type 1 Obesity Due to bladder management, bowel management, safety, skin/wound care, disease management, medication administration, pain management, and patient education,  does the patient require 24 hr/day rehab nursing? Yes Does the patient require coordinated care of a physician, rehab nurse, therapy disciplines of PT and OT to address physical and functional deficits in the context of the above medical diagnosis(es)? Yes Addressing deficits in the following areas: balance, endurance, locomotion, strength, transferring, bowel/bladder control, bathing, dressing, feeding, grooming, and toileting Can the patient actively participate in an intensive therapy program of at least 3 hrs of therapy per day at least 5 days per week? Yes The potential for patient to make measurable gains while on inpatient rehab is good Anticipated functional outcomes upon discharge from inpatient rehab are modified independent and supervision  with PT, modified independent and supervision with OT, n/a with SLP. Estimated rehab length of stay to reach the above functional goals is: 7-10 days Anticipated discharge destination: Home Overall Rehab/Functional Prognosis: good   RECOMMENDATIONS: This patient's condition is appropriate for continued rehabilitative care in the following setting: CIR Patient has agreed to participate in recommended program. Yes Note that insurance prior  authorization may be required for reimbursement for recommended care.   Comment:  Suggest Sorbitol  30cc x1 since LBM 3-4 days ago and feeling constipated- normally has IBS with Constipation and would suggest adding Senna-S for more long term, but needs to be cleaned out.  Suggest trying Lidoderm  patches 2 patches for upper/mid back pain- was taking Oxycodone  at home, however when Oxy wasn't in UDS, was discharged fro [facility] pain clinic, of note.  Pt is a great candidate for inpt rehab- CIR- will have admissions coordinator submit for insurance.  4. Thank you for this consult.  Of note, word finding issues are chronic per pt.    I spent a total of  53  minutes on total care today- >50% coordination of care- due to    D/w pt' snursing as well as review of chart, interview and exam, and documentation.    Megan Lovorn, MD 10/27/2023          Routing History

## 2023-10-28 NOTE — Progress Notes (Addendum)
 Urbano Albright, MD  Physician Physical Medicine and Rehabilitation   PMR Pre-admission    Signed   Date of Service: 10/27/2023 12:56 PM  Related encounter: ED to Hosp-Admission (Discharged) from 10/25/2023 in [place] [facility]   Signed     Expand All Collapse All  Show:Clear all [x] Written[x] Templated[x] Copied  Added by: [x] Alison Heron [name], RN  [] Hover for details PMR Admission Coordinator Pre-Admission Assessment   Patient: Holly Henry is an 68 y.o., female MRN: [id number] DOB: 08-08-1955 Height: 5' 1 (154.9 cm) Weight: 76.7 kg                                                                                                                                      Insurance Information HMO:     PPO: yes     PCP:      IPA:      80/20:      OTHER:  PRIMARY: [employer] Choice  PPO    Policy#: [id number]   ; Medicare: [id number]   Subscriber: pt CM Name: Charlott Perfect      Phone#: [phone] ext [phone]     Fax#: [phone] Pre-Cert#: [id number] approved 7/16 until 7/23 with f/u by Loraine phone [phone] fax [phone]      Employer:  Benefits:  Phone #: [phone]     Name: 7/15 Eff. Date: 04/15/23     Deduct: none      Out of Pocket Max: $9350      Life Max: none  CIR: $399 co pay per day days 1 until 3($7605)      SNF: no co pay per day days 1 until 20; $214 co pay per day days 21 until 100 Outpatient: $25 per visit     Co-Pay:  Home Health: 100%      Co-Pay:  DME: 80%     Co-Pay: 20% Providers: in network  SECONDARY: none      Policy#:       Phone#:    Artist:       Phone#:    The Data processing manager" for patients in Inpatient Rehabilitation Facilities with attached "Privacy Act Statement-Health Care Records" was provided and verbally reviewed with: Patient   Emergency Contact Information Contact Information       Name Relation Home Work Mobile    Benyo,Jennifer Daughter [phone]    [phone]         Other Contacts   None on File      Current Medical History  Patient Admitting Diagnosis: CVA   History of Present Illness: 68 yo female with history for paroxysmal a fib on ELiquis , HTN, HLD, RA and fibromyalgia, GERD, IBS, chronic anxiety/depression, prior CVA, prediabetes who presented on 10/26/23 with sudden onset of right arm and leg weakness.  Blood pressure 150/106 per EMS. Cranial CT scan negative for acute changes. Chronic microvascular ischemic changes. CTA showed  no emergent large vessel occlusion. Patient did not receive TNK. MRI of the brain showed acute nonhemorrhagic infarct involving the left precentral gyrus. Small lacunar white matter infarct involving the left temporal occipital junction and the left parietal lobe. Admission chemistries unremarkable, hemoglobin A1c 6.0. Echocardiogram with ejection fraction of 65 to 70% no wall motion abnormalities grade 1 diastolic dysfunction. Neurology follow-up felt left frontal, temporoparietal infarct secondary to atrial fibrillation while on OAC likely Eliquis  failure transition to Pradaxa . Tolerating a regular consistency diet. Therapy evaluations completed due to patient decreased functional ability right side weakness was admitted for a comprehensive rehab program.    Complete NIHSS TOTAL: 3 Glasgow Coma Scale Score: 15   Patient[facility] has been reviewed by the rehabilitation admission coordinator and physician.   Past Medical History      Past Medical History:  Diagnosis Date   A-fib (HCC)     Fibromyalgia     High cholesterol     Hypertension     Osteoarthritis     Prediabetes     Rheumatoid arthritis (HCC)          Has the patient had major surgery during 100 days prior to admission? No   Family History  family history is not on file.   Current Medications   Current Medications    Current Facility-Administered Medications:    acetaminophen  (TYLENOL ) tablet  650 mg, 650 mg, Oral, Q6H PRN, Shona Terry SAILOR, DO, 650 mg at 10/28/23 1451   atorvastatin  (LIPITOR ) tablet 80 mg, 80 mg, Oral, Daily, Lindzen, Eric, MD, 80 mg at 10/28/23 0941   busPIRone  (BUSPAR ) tablet 15 mg, 15 mg, Oral, BID, Hall, Carole N, DO, 15 mg at 10/28/23 0941   dabigatran  (PRADAXA ) capsule 150 mg, 150 mg, Oral, Q12H, Sreeram, Narendranath, MD, 150 mg at 10/28/23 0942   DULoxetine  (CYMBALTA ) DR capsule 90 mg, 90 mg, Oral, Daily, Shona Terry SAILOR, DO, 90 mg at 10/28/23 9057   ezetimibe  (ZETIA ) tablet 10 mg, 10 mg, Oral, Daily, Hall, Carole N, DO, 10 mg at 10/28/23 0945   melatonin tablet 5 mg, 5 mg, Oral, QHS PRN, Shona, Carole N, DO   oxyCODONE  (Oxy IR/ROXICODONE ) immediate release tablet 5 mg, 5 mg, Oral, Q4H PRN, Shona Terry N, DO, 5 mg at 10/28/23 9185   pantoprazole  (PROTONIX ) EC tablet 40 mg, 40 mg, Oral, q morning, Hall, Carole N, DO, 40 mg at 10/28/23 0941   polyethylene glycol (MIRALAX  / GLYCOLAX ) packet 17 g, 17 g, Oral, Daily PRN, Shona Terry N, DO, 17 g at 10/28/23 9057   prochlorperazine  (COMPAZINE ) injection 5 mg, 5 mg, Intravenous, Q6H PRN, Shona Terry SAILOR, DO   sorbitol  70 % solution 30 mL, 30 mL, Oral, Daily PRN, Darci Pore, MD, 30 mL at 10/28/23 1448     Patients Current Diet:  Diet Order                  Diet Heart Room service appropriate? Yes; Fluid consistency: Thin  Diet effective now                       Precautions / Restrictions Precautions Precautions: Fall, Other (comment) Precaution/Restrictions Comments: watch bp Restrictions Weight Bearing Restrictions Per Provider Order: No    Has the patient had 2 or more falls or a fall with injury in the past year?No   Prior Activity Level Community (5-7x/wk): Independent without AD, rarely drives   Prior Functional Level Prior  Function Prior Level of Function : Independent/Modified Independent, History of Falls (last six months) Mobility Comments: No assistive device. 2 falls in last  month; several falls over last year ADLs Comments: independent   Self Care: Did the patient need help bathing, dressing, using the toilet or eating?  Independent   Indoor Mobility: Did the patient need assistance with walking from room to room (with or without device)? Independent   Stairs: Did the patient need assistance with internal or external stairs (with or without device)? Independent   Functional Cognition: Did the patient need help planning regular tasks such as shopping or remembering to take medications? Independent   Patient Information Are you of Hispanic, Latino/a,or Spanish origin?: A. No, not of Hispanic, Latino/a, or Spanish origin What is your race?: A. White Do you need or want an interpreter to communicate with a doctor or health care staff?: 0. No   Patient's Response To:  Health Literacy and Transportation Is the patient able to respond to health literacy and transportation needs?: Yes Health Literacy - How often do you need to have someone help you when you read instructions, pamphlets, or other written material from your doctor or pharmacy?: Never In the past 12 months, has lack of transportation kept you from medical appointments or from getting medications?: No In the past 12 months, has lack of transportation kept you from meetings, work, or from getting things needed for daily living?: No   Home Assistive Devices / Equipment Home Equipment: Hand held shower head, Grab bars - tub/shower, Grab bars - toilet   Prior Device Use: Indicate devices/aids used by the patient prior to current illness, exacerbation or injury? None of the above   Current Functional Level Cognition   Arousal/Alertness: Awake/alert Overall Cognitive Status: Within Functional Limits for tasks assessed Orientation Level: Oriented X4    Extremity Assessment (includes Sensation/Coordination)   Upper Extremity Assessment: RUE deficits/detail, Right hand dominant RUE Deficits / Details:  grossly 2-/5. pt able to complete lap slides with + time, pt abel to demonstrate gross opposition with + time and hold Rw, seems to have more of a proprioceptive issue RUE Sensation: decreased proprioception RUE Coordination: decreased fine motor, decreased gross motor  Lower Extremity Assessment: Defer to PT evaluation RLE Deficits / Details: grossly 4/5     ADLs   Overall ADL's : Needs assistance/impaired Eating/Feeding: Set up, Sitting Eating/Feeding Details (indicate cue type and reason): set- up to open lids and set- up coffee Grooming: Moderate assistance, Sitting Grooming Details (indicate cue type and reason): for bil UE tasks Upper Body Bathing: Minimal assistance, Sitting Upper Body Bathing Details (indicate cue type and reason): simulated via UB dressing Lower Body Bathing: Maximal assistance, Sit to/from stand Lower Body Bathing Details (indicate cue type and reason): simulated via LB dressing Upper Body Dressing : Minimal assistance, Sitting Upper Body Dressing Details (indicate cue type and reason): to don back side gown Lower Body Dressing: Maximal assistance, Sit to/from stand Lower Body Dressing Details (indicate cue type and reason): attempted to don socsk from EOB but had LOB posteriorly Toilet Transfer: Minimal assistance, Rolling walker (2 wheels), Ambulation Toilet Transfer Details (indicate cue type and reason): simulated via functioanl mobility Functional mobility during ADLs: Minimal assistance, Rolling walker (2 wheels) General ADL Comments: ADL participation impacted mostly by impaired balance and RUE hemiparesis     Mobility   Overal bed mobility: Needs Assistance Bed Mobility: Supine to Sit Supine to sit: Min assist, HOB elevated Sit to supine: Contact guard  assist, HOB elevated, Used rails General bed mobility comments: light MIN A to elevate trunk     Transfers   Overall transfer level: Needs assistance Equipment used: Rolling walker (2  wheels) Transfers: Sit to/from Stand Sit to Stand: Min assist General transfer comment: MIN A to rise from EOB with cues for hand placement     Ambulation / Gait / Stairs / Wheelchair Mobility   Ambulation/Gait Ambulation/Gait assistance: Min assist, Contact guard assist Gait Distance (Feet): 10 Feet (x2) Assistive device: Rolling walker (2 wheels) Gait Pattern/deviations: Step-through pattern, Decreased stride length, Decreased stance time - right, Knee flexed in stance - right, Trunk flexed General Gait Details: Slow gait with mild RLE instability requiring cues for quad contraction during stance and intermittent min A for stability. Cues for RW management with pt running into obstacles, increased R step length, and upright posture     Posture / Balance Dynamic Sitting Balance Sitting balance - Comments: able to static sit with CGA but did experience posterior LOB with ADL task Balance Overall balance assessment: Needs assistance Sitting-balance support: Single extremity supported, Feet unsupported, No upper extremity supported Sitting balance-Leahy Scale: Fair Sitting balance - Comments: able to static sit with CGA but did experience posterior LOB with ADL task Postural control: Posterior lean Standing balance support: Bilateral upper extremity supported, During functional activity, Reliant on assistive device for balance Standing balance-Leahy Scale: Poor Standing balance comment: reliant on RW     Special needs/care consideration          Previous Home Environment  Living Arrangements:  (lives with daughter and grandchildren)  Lives With: Family Available Help at Discharge: Family, Available 24 hours/day Type of Home: House Home Layout: Two level, Laundry or work area in basement, Able to live on main level with bedroom/bathroom Alternate Level Stairs-Rails: Right Alternate Level Stairs-Number of Steps: flight Home Access: Stairs to enter Entrance Stairs-Rails:  None Entrance Stairs-Number of Steps: 1 Bathroom Shower/Tub: Engineer, manufacturing systems: Standard Bathroom Accessibility: Yes How Accessible: Accessible via walker Home Care Services: No Additional Comments: May have a w/c available   Discharge Living Setting Plans for Discharge Living Setting: Patient's home, Lives with (comment) (daughter and grandchilren) Type of Home at Discharge: House Discharge Home Layout: Two level, Laundry or work area in basement, Able to live on main level with bedroom/bathroom Alternate Level Stairs-Rails: Right Alternate Level Stairs-Number of Steps: flight Discharge Home Access: Stairs to enter Entrance Stairs-Rails: None Entrance Stairs-Number of Steps: 1 Discharge Bathroom Shower/Tub: Tub/shower unit Discharge Bathroom Toilet: Standard Discharge Bathroom Accessibility: Yes How Accessible: Accessible via walker Does the patient have any problems obtaining your medications?: No   Social/Family/Support Systems Patient Roles: Parent Contact Information: daughter, Delon Anticipated Caregiver: daughter Anticipated Industrial/product designer Information: see contacts Ability/Limitations of Caregiver: no limitaion, unemployed an dfiling for disability Caregiver Availability: 24/7 Discharge Plan Discussed with Primary Caregiver: Yes Is Caregiver In Agreement with Plan?: Yes Does Caregiver/Family have Issues with Lodging/Transportation while Pt is in Rehab?: No   Goals Patient/Family Goal for Rehab: Mod I to supervision with PT, OT and SLP Expected length of stay: ELOS 7 to 10 days Pt/Family Agrees to Admission and willing to participate: Yes Program Orientation Provided & Reviewed with Pt/Caregiver Including Roles  & Responsibilities: Yes   Decrease burden of Care through IP rehab admission: n/a   Possible need for SNF placement upon discharge:Not anticipated   Patient Condition: This patient's condition remains as documented in the consult dated  10/27/23, in which the Rehabilitation  Physician determined and documented that the patient's condition is appropriate for intensive rehabilitative care in an inpatient rehabilitation facility. Will admit to inpatient rehab today.   Preadmission Screen Completed By:  Alison Heron Lot, RN MSN 10/28/2023 3:13 PM ______________________________________________________________________   Discussed status with Dr. Urbano on 10/28/23 at 1513 and received approval for admission today.   Admission Coordinator:  Alison Heron Lot, RN MSN time [id number] Date 10/28/23            Revision History  Routing History

## 2023-10-28 NOTE — H&P (Signed)
 Physical Medicine and Rehabilitation Admission H&P        Chief Complaint  Patient presents with   Code Stroke  : HPI: Holly Henry is a 68 year old right-handed female with history significant for paroxysmal atrial fibrillation on Eliquis , hypertension, hyperlipidemia, rheumatoid arthritis, GERD, IBS,  anxiety/depression maintained on BuSpar , prior TIA/CVA, prediabetes.  Per chart review patient lives with daughter and grandchildren.  Independent prior to admission but did have occasional fall over the last 6 months.  To level home, laundry/work area and basement.  Patient able to stay on main level.  One-step to entry.  Presented 10/25/2023 with acute onset of right sided weakness and sensory loss.  Blood pressure 150/106 per EMS.  Cranial CT scan negative for acute changes.  Chronic microvascular ischemic changes.  CTA showed no emergent large vessel occlusion.  Patient did not receive TNK.  MRI of the brain showed acute nonhemorrhagic infarct involving the left precentral gyrus.  Small lacunar white matter infarct involving the left temporal occipital junction and the left parietal lobe.  Admission chemistries unremarkable, hemoglobin A1c 6.0.  Echocardiogram with ejection fraction of 65 to 70% no wall motion abnormalities grade 1 diastolic dysfunction.  Neurology follow-up felt left frontal, temporoparietal infarct secondary to atrial fibrillation while on OAC likely Eliquis  failure transition to Pradaxa .  Tolerating a regular consistency diet.  Patient reports she was previously on methotrexate for rheumatoid arthritis but this was discontinued due to possible side effects about 6 months ago.  Therapy evaluations completed due to patient decreased functional ability right side weakness was admitted for a comprehensive rehab program.   Review of Systems  Constitutional:  Positive for malaise/fatigue. Negative for chills and fever.  HENT:  Negative for hearing loss.   Eyes:  Negative for  blurred vision and double vision.  Respiratory:  Negative for cough, shortness of breath and wheezing.   Cardiovascular:  Positive for palpitations. Negative for chest pain, leg swelling and PND.  Gastrointestinal:  Positive for constipation. Negative for diarrhea, heartburn, nausea and vomiting.       GERD  Genitourinary:  Positive for frequency. Negative for dysuria, flank pain and hematuria.  Musculoskeletal:  Positive for falls, joint pain and myalgias.  Skin:  Negative for rash.  Neurological:  Positive for sensory change, focal weakness, weakness and headaches.  Psychiatric/Behavioral:  Positive for depression. The patient is nervous/anxious.        Anxiety  All other systems reviewed and are negative.      Past Medical History:  Diagnosis Date   A-fib (HCC)     Fibromyalgia     High cholesterol     Hypertension     Osteoarthritis     Prediabetes     Rheumatoid arthritis (HCC)               Past Surgical History:  Procedure Laterality Date   BACK SURGERY       CATARACT EXTRACTION Bilateral     TONSILLECTOMY            History reviewed. No pertinent family history.     Social History:  reports that she has never smoked. She has never used smokeless tobacco. She reports that she does not drink alcohol and does not use drugs. Allergies:  Allergies       Allergies  Allergen Reactions   Gramineae Pollens Dermatitis      Dust, pollen   redtop grass pollen extract   [place] Grass Extract Cough  Buprenorphine Hcl Other (See Comments)      Other Reaction(s): Mental Status Changes, Psychosis   Pollen Extract Dermatitis      Dust, pollen            Medications Prior to Admission  Medication Sig Dispense Refill   acetaminophen  (TYLENOL ) 500 MG tablet Take 1,000 mg by mouth every 6 (six) hours as needed for mild pain (pain score 1-3) or headache.       apixaban  (ELIQUIS ) 5 MG TABS tablet Take 5 mg by mouth 2 (two) times daily.       ascorbic acid  (VITAMIN C ) 500  MG tablet Take 500 mg by mouth 2 (two) times daily.       aspirin EC 81 MG tablet Take 81 mg by mouth at bedtime.       atorvastatin  (LIPITOR ) 80 MG tablet Take 80 mg by mouth at bedtime.       busPIRone  (BUSPAR ) 15 MG tablet Take 15 mg by mouth 2 (two) times daily.       diclofenac Sodium (VOLTAREN) 1 % GEL Apply 1 application topically 2 (two) times daily as needed (arthritis pain).       DULoxetine  (CYMBALTA ) 30 MG capsule Take 90 mg by mouth daily.       ezetimibe  (ZETIA ) 10 MG tablet Take 10 mg by mouth in the morning.       flecainide (TAMBOCOR) 50 MG tablet Take 50 mg by mouth 2 (two) times daily.       furosemide  (LASIX ) 20 MG tablet Take 1 tablet (20 mg total) by mouth daily. (Patient taking differently: Take 20 mg by mouth daily as needed for fluid or edema (feet swelling).) 2 tablet 0   gabapentin  (NEURONTIN ) 300 MG capsule Take 300 mg by mouth 3 (three) times daily.       hydroxypropyl methylcellulose / hypromellose (ISOPTO TEARS / GONIOVISC) 2.5 % ophthalmic solution Place 1 drop into both eyes 3 (three) times daily as needed for dry eyes.       LUMIGAN 0.01 % SOLN Place 1 drop into both eyes at bedtime.       magnesium oxide (MAG-OX) 400 (240 Mg) MG tablet Take 400 mg by mouth at bedtime.       methenamine  (HIPREX) 1 g tablet Take 1 g by mouth 2 (two) times daily.       metoprolol tartrate (LOPRESSOR) 25 MG tablet Take 25 mg by mouth daily.       Multiple Vitamin (MULTIVITAMIN WITH MINERALS) TABS tablet Take 1 tablet by mouth daily.       naproxen sodium (ALEVE) 220 MG tablet Take 440 mg by mouth 2 (two) times daily as needed (pain).       ondansetron  (ZOFRAN ) 4 MG tablet Take 4 mg by mouth every 8 (eight) hours as needed for nausea or vomiting.       ORENCIA CLICKJECT 125 MG/ML SOAJ Inject 125 mg into the skin once a week. Inject subcutaneously on Sunday.       pantoprazole  (PROTONIX ) 40 MG tablet Take 40 mg by mouth every morning.       REPATHA SURECLICK 140 MG/ML SOAJ Inject 140  mg into the skin every 14 (fourteen) days. Inject subcutaneously every 14 days.       tiZANidine (ZANAFLEX) 2 MG tablet Take 2 mg by mouth in the morning and at bedtime. Take one tablet (2mg ) in the morning, and take two tablets (4mg ) at bedtime.  Home: Home Living Family/patient expects to be discharged to:: Private residence Living Arrangements:  (lives with daughter and grandchildren) Available Help at Discharge: Family, Available 24 hours/day Type of Home: House Home Access: Stairs to enter Secretary/administrator of Steps: 1 Entrance Stairs-Rails: None Home Layout: Two level, Laundry or work area in basement, Able to live on main level with bedroom/bathroom Alternate Level Stairs-Number of Steps: flight Alternate Level Stairs-Rails: Right Bathroom Shower/Tub: Engineer, manufacturing systems: Standard Bathroom Accessibility: Yes Home Equipment: Hand held shower head, Grab bars - tub/shower, Grab bars - toilet Additional Comments: May have a w/c available  Lives With: Family   Functional History: Prior Function Prior Level of Function : Independent/Modified Independent, History of Falls (last six months) Mobility Comments: No assistive device. 2 falls in last month; several falls over last year ADLs Comments: independent   Functional Status:  Mobility: Bed Mobility Overal bed mobility: Needs Assistance Bed Mobility: Sit to Supine, Supine to Sit Supine to sit: Contact guard, Used rails, HOB elevated Sit to supine: Contact guard assist, HOB elevated, Used rails General bed mobility comments: increased time and effort Transfers Overall transfer level: Needs assistance Equipment used: Rolling walker (2 wheels) Transfers: Sit to/from Stand Sit to Stand: Min assist General transfer comment: Light min A from EOB and low toilet seat with cues for hand placement Ambulation/Gait Ambulation/Gait assistance: Min assist, Contact guard assist Gait Distance (Feet):  10 Feet (x2) Assistive device: Rolling walker (2 wheels) Gait Pattern/deviations: Step-through pattern, Decreased stride length, Decreased stance time - right, Knee flexed in stance - right, Trunk flexed General Gait Details: Slow gait with mild RLE instability requiring cues for quad contraction during stance and intermittent min A for stability. Cues for RW management with pt running into obstacles, increased R step length, and upright posture   ADL: ADL Overall ADL's : Needs assistance/impaired Eating/Feeding: Set up, Bed level Grooming: Moderate assistance, Sitting Grooming Details (indicate cue type and reason): for bil UE tasks Upper Body Bathing: Moderate assistance, Sitting Lower Body Bathing: Maximal assistance, Sit to/from stand Upper Body Dressing : Moderate assistance, Sitting, Maximal assistance Lower Body Dressing: Maximal assistance, Sit to/from stand Toilet Transfer: Moderate assistance Functional mobility during ADLs: Moderate assistance   Cognition: Cognition Overall Cognitive Status: Within Functional Limits for tasks assessed Arousal/Alertness: Awake/alert Orientation Level: Oriented X4 Cognition Arousal: Alert Behavior During Therapy: WFL for tasks assessed/performed Overall Cognitive Status: Within Functional Limits for tasks assessed   Physical Exam: Blood pressure (!) 157/79, pulse 83, temperature 98 F (36.7 C), temperature source Oral, resp. rate 16, height 5' 1 (1.549 m), weight 76.7 kg, SpO2 93%.     General: No apparent distress, obese HEENT: Head is normocephalic, atraumatic, sclera anicteric, oral mucosa little dry, edentulous  Neck: Supple without JVD  Heart: Reg rate and rhythm.  Chest: CTA bilaterally without wheezes, rales, or rhonchi; no distress Abdomen: Soft, non-tender, ND, bowel sounds positive. Extremities: No clubbing, cyanosis, or edema. Pulses are 2+ Psych: Pt's affect is appropriate. Pt is cooperative Skin: Clean and intact  without signs of breakdown Neuro:     Mental Status: AAOx3, Provides date of birth, memory grossly intact Speech/Languate: Slight word-finding  deficits present this is reported to be chronic.  Naming and repetition intact, fluent, follows simple commands, World- able to spell forwards and backwards CRANIAL NERVES: II: PERRL. Visual fields full III, IV, VI: EOM intact, no gaze preference or deviation V: normal sensation bilaterally VII: no asymmetry VIII: normal hearing to speech IX, X: normal palatal  elevation XI: 5/5 head turn and 5/5 shoulder shrug bilaterally XII: Tongue midline     MOTOR: RUE: 2-/5 Deltoid, 4-/5 Biceps, 2+/5 Triceps,2/5 Grip LUE: 4/5 Deltoid, 4/5 Biceps, 4/5 Triceps, 4/5 Grip RLE: HF 3/5, KE 4-/5, ADF 4/5, APF 4/5 LLE: HF 4+/5, KE 4+/5, ADF 4+/5, APF 4+/5     REFLEXES: 1/4 throughout, no Hoffman's, no clonus   SENSORY: Normal to touch all 4 extremities   Coordination: FTN intact in LUE intact    No hypertonia noted   MSK: Arthritic changes in bilateral hands Diffuse tenderness to palpation bilateral upper and lower extremities     IV RUE in place   Lab Results Last 48 Hours        Results for orders placed or performed during the hospital encounter of 10/25/23 (from the past 48 hours)  Hemoglobin A1c     Status: None    Collection Time: 10/27/23 10:09 AM  Result Value Ref Range    Hgb A1c MFr Bld 5.6 4.8 - 5.6 %      Comment: (NOTE) Diagnosis of Diabetes The following HbA1c ranges recommended by the American Diabetes Association (ADA) may be used as an aid in the diagnosis of diabetes mellitus.   Hemoglobin             Suggested A1C NGSP%              Diagnosis   <5.7                   Non Diabetic   5.7-6.4                Pre-Diabetic   >6.4                   Diabetic   <7.0                   Glycemic control for                       adults with diabetes.        Mean Plasma Glucose 114.02 mg/dL      Comment: Performed at [facility], [address] [place], [place] [address]       Imaging Results (Last 48 hours)  ECHOCARDIOGRAM COMPLETE Result Date: 10/26/2023    ECHOCARDIOGRAM REPORT   Patient Name:   Holly Henry Date of Exam: 10/26/2023 Medical Rec #:  [id number]   Height:       61.0 in Accession #:    [id number]  Weight:       165.0 lb Date of Birth:  1955-09-23   BSA:          1.740 m Patient Age:    68 years    BP:           176/72 mmHg Patient Gender: F           HR:           62 bpm. Exam Location:  Inpatient Procedure: 2D Echo, 3D Echo, Color Doppler, Cardiac Doppler and Strain Analysis            (Both Spectral and Color Flow Doppler were utilized during            procedure). Indications:     Stroke I63.9  History:         Patient has no prior history of Echocardiogram examinations.  TIA; Risk Factors:Hypertension and Pre-diabetes.  Sonographer:     Koleen Popper RDCS Referring Phys:  [id number] ERIC LINDZEN Diagnosing Phys: Salena Negri MD  Sonographer Comments: Global longitudinal strain was attempted. IMPRESSIONS  1. Left ventricular ejection fraction, by estimation, is 65 to 70%. The left ventricle has normal function. The left ventricle has no regional wall motion abnormalities. Left ventricular diastolic parameters are consistent with Grade I diastolic dysfunction (impaired relaxation). The global longitudinal strain is normal.  2. Right ventricular systolic function is normal. The right ventricular size is normal. There is normal pulmonary artery systolic pressure.  3. Left atrial size was mildly dilated.  4. Right atrial size was mildly dilated.  5. The mitral valve is normal in structure. Mild mitral valve regurgitation.  6. The aortic valve is tricuspid. Aortic valve regurgitation is trivial. Aortic valve sclerosis is present, with no evidence of aortic valve stenosis.  7. The inferior vena cava is normal in size with greater than 50% respiratory variability, suggesting right atrial pressure of 3  mmHg. FINDINGS  Left Ventricle: Left ventricular ejection fraction, by estimation, is 65 to 70%. The left ventricle has normal function. The left ventricle has no regional wall motion abnormalities. Strain was performed and the global longitudinal strain is normal. The  left ventricular internal cavity size was normal in size. There is borderline concentric left ventricular hypertrophy. Left ventricular diastolic parameters are consistent with Grade I diastolic dysfunction (impaired relaxation). Right Ventricle: The right ventricular size is normal. No increase in right ventricular wall thickness. Right ventricular systolic function is normal. There is normal pulmonary artery systolic pressure. The tricuspid regurgitant velocity is 2.78 m/s, and  with an assumed right atrial pressure of 3 mmHg, the estimated right ventricular systolic pressure is 33.9 mmHg. Left Atrium: Left atrial size was mildly dilated. Right Atrium: Right atrial size was mildly dilated. Pericardium: There is no evidence of pericardial effusion. Mitral Valve: The mitral valve is normal in structure. Mild mitral valve regurgitation. Tricuspid Valve: The tricuspid valve is normal in structure. Tricuspid valve regurgitation is mild. Aortic Valve: The aortic valve is tricuspid. Aortic valve regurgitation is trivial. Aortic valve sclerosis is present, with no evidence of aortic valve stenosis. Pulmonic Valve: The pulmonic valve was normal in structure. Pulmonic valve regurgitation is trivial. Aorta: The aortic root is normal in size and structure. Venous: The inferior vena cava is normal in size with greater than 50% respiratory variability, suggesting right atrial pressure of 3 mmHg. IAS/Shunts: No atrial level shunt detected by color flow Doppler. Additional Comments: 3D was performed not requiring image post processing on an independent workstation and was normal.  LEFT VENTRICLE PLAX 2D LVIDd:         4.20 cm   Diastology LVIDs:         2.50 cm    LV e' medial:    6.74 cm/s LV PW:         1.00 cm   LV E/e' medial:  9.5 LV IVS:        1.10 cm   LV e' lateral:   8.70 cm/s LVOT diam:     1.90 cm   LV E/e' lateral: 7.3 LV SV:         59 LV SV Index:   34 LVOT Area:     2.84 cm                           3D Volume EF:  3D EF:        65 %                          LV EDV:       109 ml                          LV ESV:       38 ml                          LV SV:        71 ml RIGHT VENTRICLE             IVC RV S prime:     15.10 cm/s  IVC diam: 1.20 cm TAPSE (M-mode): 1.6 cm LEFT ATRIUM             Index LA diam:        4.40 cm 2.53 cm/m LA Vol (A2C):   35.4 ml 20.34 ml/m LA Vol (A4C):   41.1 ml 23.61 ml/m LA Biplane Vol: 38.7 ml 22.24 ml/m  AORTIC VALVE LVOT Vmax:   94.30 cm/s LVOT Vmean:  60.300 cm/s LVOT VTI:    0.209 m  AORTA Ao Root diam: 3.20 cm Ao Asc diam:  3.20 cm MITRAL VALVE               TRICUSPID VALVE MV Area (PHT): 3.53 cm    TR Peak grad:   30.9 mmHg MV Decel Time: 215 msec    TR Vmax:        278.00 cm/s MV E velocity: 63.90 cm/s MV A velocity: 67.10 cm/s  SHUNTS MV E/A ratio:  0.95        Systemic VTI:  0.21 m                            Systemic Diam: 1.90 cm Salena Negri MD Electronically signed by Salena Negri MD Signature Date/Time: 10/26/2023/6:01:57 PM    Final     MR BRAIN WO CONTRAST Result Date: 10/26/2023 CLINICAL DATA:  Neuro deficit, acute, stroke suspected EXAM: MRI HEAD WITHOUT CONTRAST TECHNIQUE: Multiplanar, multiecho pulse sequences of the brain and surrounding structures were obtained without intravenous contrast. COMPARISON:  CT angiogram of the head dated October 25, 2023 and MRI of the head dated February 22, 2022. FINDINGS: Brain: There is restricted in diffusion involving the left precentral gyrus there is also a small acute subcortical lacunar infarct at the left temporal occipital junction, which is seen on image 69 of series 5. An additional more subtle focus of restricted diffusion is present within  the left parietal white matter on image 79. There is age related volume loss and extensive diffuse cerebral white matter disease. There is no evidence of hemorrhage, mass or hydrocephalus. Vascular: Normal vascular flow voids. Skull and upper cervical spine: Normal marrow signal. No osseous lesions. Sinuses/Orbits: Clear paranasal sinuses. Status post bilateral lens replacement. Other: None. IMPRESSION: 1. Acute nonhemorrhagic infarct involving the left precentral gyrus. 2. Small lacunar white matter infarcts involving the left temporal occipital junction and the left parietal lobe. 3. Advanced cerebral white matter disease. Electronically Signed   By: Evalene Coho M.D.   On: 10/26/2023 12:20           Blood pressure (!) 157/79, pulse 83, temperature 98 F (36.7 C), temperature  source Oral, resp. rate 16, height 5' 1 (1.549 m), weight 76.7 kg, SpO2 93%.   Medical Problem List and Plan: 1. Functional deficits secondary to left frontal, temporoparietal infarction likely due to atrial fibrillation while on OAC likely due to Eliquis  failure             -patient may shower             -ELOS/Goals: 7 to 10 days, mod I to supervision with PT and OT             -Admit to CIR 2.  Antithrombotics: -DVT/anticoagulation:  Pharmaceutical: Other (comment) Pradaxa              -antiplatelet therapy: N/A 3. Pain Management: Oxycodone  5 mg every 4 hours as needed             - Pt reports she was previously discharged from [facility] pain clinic after oxycodone  was not found in UDS 4. Mood/Behavior/Sleep: BuSpar  15 mg twice daily, Cymbalta  90 mg daily, melatonin 5 mg nightly as needed             -antipsychotic agents: N/A 5. Neuropsych/cognition: This patient is capable of making decisions on her own behalf. 6. Skin/Wound Care: Routine skin checks 7. Fluids/Electrolytes/Nutrition: Routine in and outs with follow-up chemistries 8.  Hyperlipidemia.  Lipitor /Zetia  9.  GERD.  Protonix  10. Atrial fibrillation.   Continue Pradaxa .  Cardiac rate controlled. 11.  Prediabetes.  Hemoglobin A1c 6.0.  Currently on a regular diet.  CBGs discontinued 12. Obesity.  Dietary counseling             Body mass index is 31.95 kg/m. 13, Rheumatoid arthritis/Fibromyalgia              -Continue Cymbalta , pain control             -She says was on methotrexate in the past but this was stopped by her rheumatologist about 6 months ago             -She was on gabapentin  300mg  TID before admission, Restart at lower dose 100mg  TID for now, could consider increasing back up at a later time 14.  Hypertension.  Continue metoprolol. 15. Constipation, Continue MiraLAX  daily and sorbitol  as needed             - Patient got sorbitol  today, consider additional medication tomorrow if no BM     Toribio PARAS Angiulli, PA-C 10/28/2023   I have personally performed a face to face diagnostic evaluation of this patient and formulated the key components of the plan.  Additionally, I have personally reviewed laboratory data, imaging studies, as well as relevant notes and concur with the physician assistant's documentation above.   The patient's status has not changed from the original H&P.  Any changes in documentation from the acute care chart have been noted above.   Murray Collier, MD

## 2023-10-28 NOTE — H&P (Signed)
 Physical Medicine and Rehabilitation Admission H&P    Chief Complaint  Patient presents with   Code Stroke  : HPI: Holly Henry is a 68 year old right-handed female with history significant for paroxysmal atrial fibrillation on Eliquis , hypertension, hyperlipidemia, rheumatoid arthritis, GERD, IBS,  anxiety/depression maintained on BuSpar , prior TIA/CVA, prediabetes.  Per chart review patient lives with daughter and grandchildren.  Independent prior to admission but did have occasional fall over the last 6 months.  To level home, laundry/work area and basement.  Patient able to stay on main level.  One-step to entry.  Presented 10/25/2023 with acute onset of right sided weakness and sensory loss.  Blood pressure 150/106 per EMS.  Cranial CT scan negative for acute changes.  Chronic microvascular ischemic changes.  CTA showed no emergent large vessel occlusion.  Patient did not receive TNK.  MRI of the brain showed acute nonhemorrhagic infarct involving the left precentral gyrus.  Small lacunar white matter infarct involving the left temporal occipital junction and the left parietal lobe.  Admission chemistries unremarkable, hemoglobin A1c 6.0.  Echocardiogram with ejection fraction of 65 to 70% no wall motion abnormalities grade 1 diastolic dysfunction.  Neurology follow-up felt left frontal, temporoparietal infarct secondary to atrial fibrillation while on OAC likely Eliquis  failure transition to Pradaxa .  Tolerating a regular consistency diet.  Patient reports she was previously on methotrexate for rheumatoid arthritis but this was discontinued due to possible side effects about 6 months ago.  Therapy evaluations completed due to patient decreased functional ability right side weakness was admitted for a comprehensive rehab program.  Review of Systems  Constitutional:  Positive for malaise/fatigue. Negative for chills and fever.  HENT:  Negative for hearing loss.   Eyes:  Negative for blurred  vision and double vision.  Respiratory:  Negative for cough, shortness of breath and wheezing.   Cardiovascular:  Positive for palpitations. Negative for chest pain, leg swelling and PND.  Gastrointestinal:  Positive for constipation. Negative for diarrhea, heartburn, nausea and vomiting.       GERD  Genitourinary:  Positive for frequency. Negative for dysuria, flank pain and hematuria.  Musculoskeletal:  Positive for falls, joint pain and myalgias.  Skin:  Negative for rash.  Neurological:  Positive for sensory change, focal weakness, weakness and headaches.  Psychiatric/Behavioral:  Positive for depression. The patient is nervous/anxious.        Anxiety  All other systems reviewed and are negative.  Past Medical History:  Diagnosis Date   A-fib (HCC)    Fibromyalgia    High cholesterol    Hypertension    Osteoarthritis    Prediabetes    Rheumatoid arthritis (HCC)    Past Surgical History:  Procedure Laterality Date   BACK SURGERY     CATARACT EXTRACTION Bilateral    TONSILLECTOMY     History reviewed. No pertinent family history. Social History:  reports that she has never smoked. She has never used smokeless tobacco. She reports that she does not drink alcohol and does not use drugs. Allergies:  Allergies  Allergen Reactions   Gramineae Pollens Dermatitis    Dust, pollen  redtop grass pollen extract   [place] Grass Extract Cough   Buprenorphine Hcl Other (See Comments)    Other Reaction(s): Mental Status Changes, Psychosis   Pollen Extract Dermatitis    Dust, pollen   Medications Prior to Admission  Medication Sig Dispense Refill   acetaminophen  (TYLENOL ) 500 MG tablet Take 1,000 mg by mouth every 6 (six) hours as  needed for mild pain (pain score 1-3) or headache.     apixaban  (ELIQUIS ) 5 MG TABS tablet Take 5 mg by mouth 2 (two) times daily.     ascorbic acid  (VITAMIN C ) 500 MG tablet Take 500 mg by mouth 2 (two) times daily.     aspirin EC 81 MG tablet Take 81 mg  by mouth at bedtime.     atorvastatin  (LIPITOR ) 80 MG tablet Take 80 mg by mouth at bedtime.     busPIRone  (BUSPAR ) 15 MG tablet Take 15 mg by mouth 2 (two) times daily.     diclofenac Sodium (VOLTAREN) 1 % GEL Apply 1 application topically 2 (two) times daily as needed (arthritis pain).     DULoxetine  (CYMBALTA ) 30 MG capsule Take 90 mg by mouth daily.     ezetimibe  (ZETIA ) 10 MG tablet Take 10 mg by mouth in the morning.     flecainide (TAMBOCOR) 50 MG tablet Take 50 mg by mouth 2 (two) times daily.     furosemide  (LASIX ) 20 MG tablet Take 1 tablet (20 mg total) by mouth daily. (Patient taking differently: Take 20 mg by mouth daily as needed for fluid or edema (feet swelling).) 2 tablet 0   gabapentin  (NEURONTIN ) 300 MG capsule Take 300 mg by mouth 3 (three) times daily.     hydroxypropyl methylcellulose / hypromellose (ISOPTO TEARS / GONIOVISC) 2.5 % ophthalmic solution Place 1 drop into both eyes 3 (three) times daily as needed for dry eyes.     LUMIGAN 0.01 % SOLN Place 1 drop into both eyes at bedtime.     magnesium oxide (MAG-OX) 400 (240 Mg) MG tablet Take 400 mg by mouth at bedtime.     methenamine  (HIPREX) 1 g tablet Take 1 g by mouth 2 (two) times daily.     metoprolol tartrate (LOPRESSOR) 25 MG tablet Take 25 mg by mouth daily.     Multiple Vitamin (MULTIVITAMIN WITH MINERALS) TABS tablet Take 1 tablet by mouth daily.     naproxen sodium (ALEVE) 220 MG tablet Take 440 mg by mouth 2 (two) times daily as needed (pain).     ondansetron  (ZOFRAN ) 4 MG tablet Take 4 mg by mouth every 8 (eight) hours as needed for nausea or vomiting.     ORENCIA CLICKJECT 125 MG/ML SOAJ Inject 125 mg into the skin once a week. Inject subcutaneously on Sunday.     pantoprazole  (PROTONIX ) 40 MG tablet Take 40 mg by mouth every morning.     REPATHA SURECLICK 140 MG/ML SOAJ Inject 140 mg into the skin every 14 (fourteen) days. Inject subcutaneously every 14 days.     tiZANidine (ZANAFLEX) 2 MG tablet Take 2 mg  by mouth in the morning and at bedtime. Take one tablet (2mg ) in the morning, and take two tablets (4mg ) at bedtime.        Home: Home Living Family/patient expects to be discharged to:: Private residence Living Arrangements:  (lives with daughter and grandchildren) Available Help at Discharge: Family, Available 24 hours/day Type of Home: House Home Access: Stairs to enter Secretary/administrator of Steps: 1 Entrance Stairs-Rails: None Home Layout: Two level, Laundry or work area in basement, Able to live on main level with bedroom/bathroom Alternate Level Stairs-Number of Steps: flight Alternate Level Stairs-Rails: Right Bathroom Shower/Tub: Engineer, manufacturing systems: Standard Bathroom Accessibility: Yes Home Equipment: Hand held shower head, Grab bars - tub/shower, Grab bars - toilet Additional Comments: May have a w/c available  Lives With: Family  Functional History: Prior Function Prior Level of Function : Independent/Modified Independent, History of Falls (last six months) Mobility Comments: No assistive device. 2 falls in last month; several falls over last year ADLs Comments: independent  Functional Status:  Mobility: Bed Mobility Overal bed mobility: Needs Assistance Bed Mobility: Sit to Supine, Supine to Sit Supine to sit: Contact guard, Used rails, HOB elevated Sit to supine: Contact guard assist, HOB elevated, Used rails General bed mobility comments: increased time and effort Transfers Overall transfer level: Needs assistance Equipment used: Rolling walker (2 wheels) Transfers: Sit to/from Stand Sit to Stand: Min assist General transfer comment: Light min A from EOB and low toilet seat with cues for hand placement Ambulation/Gait Ambulation/Gait assistance: Min assist, Contact guard assist Gait Distance (Feet): 10 Feet (x2) Assistive device: Rolling walker (2 wheels) Gait Pattern/deviations: Step-through pattern, Decreased stride length, Decreased  stance time - right, Knee flexed in stance - right, Trunk flexed General Gait Details: Slow gait with mild RLE instability requiring cues for quad contraction during stance and intermittent min A for stability. Cues for RW management with pt running into obstacles, increased R step length, and upright posture    ADL: ADL Overall ADL's : Needs assistance/impaired Eating/Feeding: Set up, Bed level Grooming: Moderate assistance, Sitting Grooming Details (indicate cue type and reason): for bil UE tasks Upper Body Bathing: Moderate assistance, Sitting Lower Body Bathing: Maximal assistance, Sit to/from stand Upper Body Dressing : Moderate assistance, Sitting, Maximal assistance Lower Body Dressing: Maximal assistance, Sit to/from stand Toilet Transfer: Moderate assistance Functional mobility during ADLs: Moderate assistance  Cognition: Cognition Overall Cognitive Status: Within Functional Limits for tasks assessed Arousal/Alertness: Awake/alert Orientation Level: Oriented X4 Cognition Arousal: Alert Behavior During Therapy: WFL for tasks assessed/performed Overall Cognitive Status: Within Functional Limits for tasks assessed  Physical Exam: Blood pressure (!) 157/79, pulse 83, temperature 98 F (36.7 C), temperature source Oral, resp. rate 16, height 5' 1 (1.549 m), weight 76.7 kg, SpO2 93%.   General: No apparent distress, obese HEENT: Head is normocephalic, atraumatic, sclera anicteric, oral mucosa little dry, edentulous  Neck: Supple without JVD  Heart: Reg rate and rhythm.  Chest: CTA bilaterally without wheezes, rales, or rhonchi; no distress Abdomen: Soft, non-tender, ND, bowel sounds positive. Extremities: No clubbing, cyanosis, or edema. Pulses are 2+ Psych: Pt's affect is appropriate. Pt is cooperative Skin: Clean and intact without signs of breakdown Neuro:    Mental Status: AAOx3, Provides date of birth, memory grossly intact Speech/Languate: Slight word-finding   deficits present this is reported to be chronic.  Naming and repetition intact, fluent, follows simple commands, World- able to spell forwards and backwards CRANIAL NERVES: II: PERRL. Visual fields full III, IV, VI: EOM intact, no gaze preference or deviation V: normal sensation bilaterally VII: no asymmetry VIII: normal hearing to speech IX, X: normal palatal elevation XI: 5/5 head turn and 5/5 shoulder shrug bilaterally XII: Tongue midline   MOTOR: RUE: 2-/5 Deltoid, 4-/5 Biceps, 2+/5 Triceps,2/5 Grip LUE: 4/5 Deltoid, 4/5 Biceps, 4/5 Triceps, 4/5 Grip RLE: HF 3/5, KE 4-/5, ADF 4/5, APF 4/5 LLE: HF 4+/5, KE 4+/5, ADF 4+/5, APF 4+/5   REFLEXES: 1/4 throughout, no Hoffman's, no clonus  SENSORY: Normal to touch all 4 extremities  Coordination: FTN intact in LUE intact   No hypertonia noted  MSK: Arthritic changes in bilateral hands Diffuse tenderness to palpation bilateral upper and lower extremities   IV RUE in place  Results for orders placed or performed during the hospital encounter of  10/25/23 (from the past 48 hours)  Hemoglobin A1c     Status: None   Collection Time: 10/27/23 10:09 AM  Result Value Ref Range   Hgb A1c MFr Bld 5.6 4.8 - 5.6 %    Comment: (NOTE) Diagnosis of Diabetes The following HbA1c ranges recommended by the American Diabetes Association (ADA) may be used as an aid in the diagnosis of diabetes mellitus.  Hemoglobin             Suggested A1C NGSP%              Diagnosis  <5.7                   Non Diabetic  5.7-6.4                Pre-Diabetic  >6.4                   Diabetic  <7.0                   Glycemic control for                       adults with diabetes.     Mean Plasma Glucose 114.02 mg/dL    Comment: Performed at [facility], [address], [place], [place] [address]   ECHOCARDIOGRAM COMPLETE Result Date: 10/26/2023    ECHOCARDIOGRAM REPORT   Patient Name:   Holly Henry Date of Exam: 10/26/2023 Medical Rec #:   [id number]   Height:       61.0 in Accession #:    [id number]  Weight:       165.0 lb Date of Birth:  1956/01/29   BSA:          1.740 m Patient Age:    68 years    BP:           176/72 mmHg Patient Gender: F           HR:           62 bpm. Exam Location:  Inpatient Procedure: 2D Echo, 3D Echo, Color Doppler, Cardiac Doppler and Strain Analysis            (Both Spectral and Color Flow Doppler were utilized during            procedure). Indications:     Stroke I63.9  History:         Patient has no prior history of Echocardiogram examinations.                  TIA; Risk Factors:Hypertension and Pre-diabetes.  Sonographer:     Koleen Popper RDCS Referring Phys:  [phone] ERIC LINDZEN Diagnosing Phys: Salena Negri MD  Sonographer Comments: Global longitudinal strain was attempted. IMPRESSIONS  1. Left ventricular ejection fraction, by estimation, is 65 to 70%. The left ventricle has normal function. The left ventricle has no regional wall motion abnormalities. Left ventricular diastolic parameters are consistent with Grade I diastolic dysfunction (impaired relaxation). The global longitudinal strain is normal.  2. Right ventricular systolic function is normal. The right ventricular size is normal. There is normal pulmonary artery systolic pressure.  3. Left atrial size was mildly dilated.  4. Right atrial size was mildly dilated.  5. The mitral valve is normal in structure. Mild mitral valve regurgitation.  6. The aortic valve is tricuspid. Aortic valve regurgitation is trivial. Aortic valve sclerosis is present, with no evidence  of aortic valve stenosis.  7. The inferior vena cava is normal in size with greater than 50% respiratory variability, suggesting right atrial pressure of 3 mmHg. FINDINGS  Left Ventricle: Left ventricular ejection fraction, by estimation, is 65 to 70%. The left ventricle has normal function. The left ventricle has no regional wall motion abnormalities. Strain was performed and the global  longitudinal strain is normal. The  left ventricular internal cavity size was normal in size. There is borderline concentric left ventricular hypertrophy. Left ventricular diastolic parameters are consistent with Grade I diastolic dysfunction (impaired relaxation). Right Ventricle: The right ventricular size is normal. No increase in right ventricular wall thickness. Right ventricular systolic function is normal. There is normal pulmonary artery systolic pressure. The tricuspid regurgitant velocity is 2.78 m/s, and  with an assumed right atrial pressure of 3 mmHg, the estimated right ventricular systolic pressure is 33.9 mmHg. Left Atrium: Left atrial size was mildly dilated. Right Atrium: Right atrial size was mildly dilated. Pericardium: There is no evidence of pericardial effusion. Mitral Valve: The mitral valve is normal in structure. Mild mitral valve regurgitation. Tricuspid Valve: The tricuspid valve is normal in structure. Tricuspid valve regurgitation is mild. Aortic Valve: The aortic valve is tricuspid. Aortic valve regurgitation is trivial. Aortic valve sclerosis is present, with no evidence of aortic valve stenosis. Pulmonic Valve: The pulmonic valve was normal in structure. Pulmonic valve regurgitation is trivial. Aorta: The aortic root is normal in size and structure. Venous: The inferior vena cava is normal in size with greater than 50% respiratory variability, suggesting right atrial pressure of 3 mmHg. IAS/Shunts: No atrial level shunt detected by color flow Doppler. Additional Comments: 3D was performed not requiring image post processing on an independent workstation and was normal.  LEFT VENTRICLE PLAX 2D LVIDd:         4.20 cm   Diastology LVIDs:         2.50 cm   LV e' medial:    6.74 cm/s LV PW:         1.00 cm   LV E/e' medial:  9.5 LV IVS:        1.10 cm   LV e' lateral:   8.70 cm/s LVOT diam:     1.90 cm   LV E/e' lateral: 7.3 LV SV:         59 LV SV Index:   34 LVOT Area:     2.84 cm                            3D Volume EF:                          3D EF:        65 %                          LV EDV:       109 ml                          LV ESV:       38 ml                          LV SV:        71 ml RIGHT VENTRICLE  IVC RV S prime:     15.10 cm/s  IVC diam: 1.20 cm TAPSE (M-mode): 1.6 cm LEFT ATRIUM             Index LA diam:        4.40 cm 2.53 cm/m LA Vol (A2C):   35.4 ml 20.34 ml/m LA Vol (A4C):   41.1 ml 23.61 ml/m LA Biplane Vol: 38.7 ml 22.24 ml/m  AORTIC VALVE LVOT Vmax:   94.30 cm/s LVOT Vmean:  60.300 cm/s LVOT VTI:    0.209 m  AORTA Ao Root diam: 3.20 cm Ao Asc diam:  3.20 cm MITRAL VALVE               TRICUSPID VALVE MV Area (PHT): 3.53 cm    TR Peak grad:   30.9 mmHg MV Decel Time: 215 msec    TR Vmax:        278.00 cm/s MV E velocity: 63.90 cm/s MV A velocity: 67.10 cm/s  SHUNTS MV E/A ratio:  0.95        Systemic VTI:  0.21 m                            Systemic Diam: 1.90 cm Salena Negri MD Electronically signed by Salena Negri MD Signature Date/Time: 10/26/2023/6:01:57 PM    Final    MR BRAIN WO CONTRAST Result Date: 10/26/2023 CLINICAL DATA:  Neuro deficit, acute, stroke suspected EXAM: MRI HEAD WITHOUT CONTRAST TECHNIQUE: Multiplanar, multiecho pulse sequences of the brain and surrounding structures were obtained without intravenous contrast. COMPARISON:  CT angiogram of the head dated October 25, 2023 and MRI of the head dated February 22, 2022. FINDINGS: Brain: There is restricted in diffusion involving the left precentral gyrus there is also a small acute subcortical lacunar infarct at the left temporal occipital junction, which is seen on image 69 of series 5. An additional more subtle focus of restricted diffusion is present within the left parietal white matter on image 79. There is age related volume loss and extensive diffuse cerebral white matter disease. There is no evidence of hemorrhage, mass or hydrocephalus. Vascular: Normal vascular flow voids. Skull and  upper cervical spine: Normal marrow signal. No osseous lesions. Sinuses/Orbits: Clear paranasal sinuses. Status post bilateral lens replacement. Other: None. IMPRESSION: 1. Acute nonhemorrhagic infarct involving the left precentral gyrus. 2. Small lacunar white matter infarcts involving the left temporal occipital junction and the left parietal lobe. 3. Advanced cerebral white matter disease. Electronically Signed   By: Evalene Coho M.D.   On: 10/26/2023 12:20      Blood pressure (!) 157/79, pulse 83, temperature 98 F (36.7 C), temperature source Oral, resp. rate 16, height 5' 1 (1.549 m), weight 76.7 kg, SpO2 93%.  Medical Problem List and Plan: 1. Functional deficits secondary to left frontal, temporoparietal infarction likely due to atrial fibrillation while on OAC likely due to Eliquis  failure  -patient may shower  -ELOS/Goals: 7 to 10 days, mod I to supervision with PT and OT  -Admit to CIR 2.  Antithrombotics: -DVT/anticoagulation:  Pharmaceutical: Other (comment) Pradaxa   -antiplatelet therapy: N/A 3. Pain Management: Oxycodone  5 mg every 4 hours as needed  - Previously discharged from [facility] pain clinic after oxycodone  was not found in UDS 4. Mood/Behavior/Sleep: BuSpar  15 mg twice daily, Cymbalta  90 mg daily, melatonin 5 mg nightly as needed  -antipsychotic agents: N/A 5. Neuropsych/cognition: This patient is capable of making decisions on her own  behalf. 6. Skin/Wound Care: Routine skin checks 7. Fluids/Electrolytes/Nutrition: Routine in and outs with follow-up chemistries 8.  Hyperlipidemia.  Lipitor /Zetia  9.  GERD.  Protonix  10. Atrial fibrillation.  Continue Pradaxa .  Cardiac rate controlled. 11.  Prediabetes.  Hemoglobin A1c 6.0.  Currently on a regular diet.  CBGs discontinued 12. Obesity.  Dietary counseling  Body mass index is 31.95 kg/m. 13, Rheumatoid arthritis/Fibromyalgia   -Continue Cymbalta , pain control  -She says was on methotrexate in the past but  this was stopped by her rheumatologist about 6 months ago  -She was on gabapentin  300mg  TID before admission, Restart at lower dose 100mg  TID for now, could consider increasing back up at a later time 14.  Hypertension.  Continue metoprolol. 15. Constipation, Continue MiraLAX  daily and sorbitol  as needed  - Patient got sorbitol  today, consider additional medication tomorrow if no BM   Toribio JINNY Pitch, PA-C 10/28/2023  I have personally performed a face to face diagnostic evaluation of this patient and formulated the key components of the plan.  Additionally, I have personally reviewed laboratory data, imaging studies, as well as relevant notes and concur with the physician assistant's documentation above.  The patient's status has not changed from the original H&P.  Any changes in documentation from the acute care chart have been noted above.  Murray Collier, MD

## 2023-10-28 NOTE — Discharge Summary (Signed)
 Physician Discharge Summary   Patient: Holly Henry MRN: [id number] DOB: 1955-12-21  Admit date:     10/25/2023  Discharge date: 10/28/23  Discharge Physician: Concepcion Riser   PCP: Gretel Piedra, NP   Recommendations at discharge:  {Tip this will not be part of the note when signed- Example include specific recommendations for outpatient follow-up, pending tests to follow-up on. (Optional):26781}  PCP follow up in 1 week. Inpatient rehab discharge.  Discharge Diagnoses: Principal Problem:   Stroke-like symptoms Active Problems:   Acute stroke due to ischemia [facility])  Resolved Problems:   * No resolved hospital problems. [facility] Course: No notes on file  Assessment and Plan: No notes have been filed under this hospital service. Service: Hospitalist     {Tip this will not be part of the note when signed Body mass index is 31.95 kg/m. , ,  Active Pressure Injury/Wound(s)     None          (Optional):26781}  {(NOTE) Pain control PDMP Statment (Optional):26782} Consultants: *** Procedures performed: ***  Disposition: {Plan; Disposition:26390} Diet recommendation:  Discharge Diet Orders (From admission, onward)     Start     Ordered   10/28/23 0000  Diet - low sodium heart healthy        10/28/23 1541           {Diet_Plan:26776} DISCHARGE MEDICATION: Allergies as of 10/28/2023       Reactions   Gramineae Pollens Dermatitis   Dust, pollen redtop grass pollen extract   [place] Grass Extract Cough   Buprenorphine Hcl Other (See Comments)   Other Reaction(s): Mental Status Changes, Psychosis   Pollen Extract Dermatitis   Dust, pollen        Medication List     STOP taking these medications    Eliquis  5 MG Tabs tablet Generic drug: apixaban    naproxen sodium 220 MG tablet Commonly known as: ALEVE       TAKE these medications    acetaminophen  500 MG tablet Commonly known as: TYLENOL  Take 1,000 mg by mouth every 6 (six)  hours as needed for mild pain (pain score 1-3) or headache.   ascorbic acid  500 MG tablet Commonly known as: VITAMIN C  Take 500 mg by mouth 2 (two) times daily.   aspirin EC 81 MG tablet Take 81 mg by mouth at bedtime.   atorvastatin  80 MG tablet Commonly known as: LIPITOR  Take 80 mg by mouth at bedtime.   busPIRone  15 MG tablet Commonly known as: BUSPAR  Take 15 mg by mouth 2 (two) times daily.   dabigatran  150 MG Caps capsule Commonly known as: PRADAXA  Take 1 capsule (150 mg total) by mouth every 12 (twelve) hours.   diclofenac Sodium 1 % Gel Commonly known as: VOLTAREN Apply 1 application topically 2 (two) times daily as needed (arthritis pain).   DULoxetine  30 MG capsule Commonly known as: CYMBALTA  Take 90 mg by mouth daily.   ezetimibe  10 MG tablet Commonly known as: ZETIA  Take 10 mg by mouth in the morning.   flecainide 50 MG tablet Commonly known as: TAMBOCOR Take 50 mg by mouth 2 (two) times daily.   furosemide  20 MG tablet Commonly known as: LASIX  Take 1 tablet (20 mg total) by mouth daily. What changed:  when to take this reasons to take this   gabapentin  300 MG capsule Commonly known as: NEURONTIN  Take 300 mg by mouth 3 (three) times daily.   hydroxypropyl methylcellulose / hypromellose 2.5 % ophthalmic solution Commonly  known as: ISOPTO TEARS / GONIOVISC Place 1 drop into both eyes 3 (three) times daily as needed for dry eyes.   Lumigan 0.01 % Soln Generic drug: bimatoprost Place 1 drop into both eyes at bedtime.   magnesium oxide 400 (240 Mg) MG tablet Commonly known as: MAG-OX Take 400 mg by mouth at bedtime.   methenamine  1 g tablet Commonly known as: HIPREX Take 1 g by mouth 2 (two) times daily.   metoprolol tartrate 25 MG tablet Commonly known as: LOPRESSOR Take 25 mg by mouth daily.   multivitamin with minerals Tabs tablet Take 1 tablet by mouth daily.   ondansetron  4 MG tablet Commonly known as: ZOFRAN  Take 4 mg by mouth every  8 (eight) hours as needed for nausea or vomiting.   Orencia ClickJect 125 MG/ML Soaj Generic drug: Abatacept Inject 125 mg into the skin once a week. Inject subcutaneously on Sunday.   pantoprazole 40 MG tablet Commonly known as: PROTONIX Take 40 mg by mouth every morning.   Repatha SureClick 140 MG/ML Soaj Generic drug: Evolocumab Inject 140 mg into the skin every 14 (fourteen) days. Inject subcutaneously every 14 days.   tiZANidine 2 MG tablet Commonly known as: ZANAFLEX Take 2 mg by mouth in the morning and at bedtime. Take one tablet (2mg) in the morning, and take two tablets (4mg) at bedtime.        Discharge Exam: Filed Weights   10/25/23 2233 10/27/23 0624  Weight: 74.8 kg 76.7 kg   ***  Condition at discharge: {DC Condition:26389}  The results of significant diagnostics from this hospitalization (including imaging, microbiology, ancillary and laboratory) are listed below for reference.   Imaging Studies: ECHOCARDIOGRAM COMPLETE Result Date: 10/26/2023    ECHOCARDIOGRAM REPORT   Patient Name:   Holly Henry Date of Exam: 10/26/2023 Medical Rec #:  [id number]   Height:       61.0 in Accession #:    [id number]  Weight:       165.0 lb Date of Birth:  11/11/1955   BSA:          1.740 m Patient Age:    68 years    BP:           176/72 mmHg Patient Gender: F           HR:           62  bpm. Exam Location:  Inpatient Procedure: 2D Echo, 3D Echo, Color Doppler, Cardiac Doppler and Strain Analysis            (Both Spectral and Color Flow Doppler were utilized during            procedure). Indications:     Stroke I63.9  History:         Patient has no prior history of Echocardiogram examinations.                  TIA; Risk Factors:Hypertension and Pre-diabetes.  Sonographer:     Koleen Popper RDCS Referring Phys:  [phone] ERIC LINDZEN Diagnosing Phys: Salena Negri MD  Sonographer Comments: Global longitudinal strain was attempted. IMPRESSIONS  1. Left ventricular ejection fraction, by  estimation, is 65 to 70%. The left ventricle has normal function. The left ventricle has no regional wall motion abnormalities. Left ventricular diastolic parameters are consistent with Grade I diastolic dysfunction (impaired relaxation). The global longitudinal strain is normal.  2. Right ventricular systolic function is normal. The right ventricular size is normal. There is normal pulmonary  artery systolic pressure.  3. Left atrial size was mildly dilated.  4. Right atrial size was mildly dilated.  5. The mitral valve is normal in structure. Mild mitral valve regurgitation.  6. The aortic valve is tricuspid. Aortic valve regurgitation is trivial. Aortic valve sclerosis is present, with no evidence of aortic valve stenosis.  7. The inferior vena cava is normal in size with greater than 50% respiratory variability, suggesting right atrial pressure of 3 mmHg. FINDINGS  Left Ventricle: Left ventricular ejection fraction, by estimation, is 65 to 70%. The left ventricle has normal function. The left ventricle has no regional wall motion abnormalities. Strain was performed and the global longitudinal strain is normal. The  left ventricular internal cavity size was normal in size. There is borderline concentric left ventricular hypertrophy. Left ventricular diastolic parameters are consistent with Grade I diastolic dysfunction (impaired relaxation). Right Ventricle: The right ventricular size is normal. No increase in right ventricular wall thickness. Right ventricular systolic function is normal. There is normal pulmonary artery systolic pressure. The tricuspid regurgitant velocity is 2.78 m/s, and  with an assumed right atrial pressure of 3 mmHg, the estimated right ventricular systolic pressure is 33.9 mmHg. Left Atrium: Left atrial size was mildly dilated. Right Atrium: Right atrial size was mildly dilated. Pericardium: There is no evidence of pericardial effusion. Mitral Valve: The mitral valve is normal in  structure. Mild mitral valve regurgitation. Tricuspid Valve: The tricuspid valve is normal in structure. Tricuspid valve regurgitation is mild. Aortic Valve: The aortic valve is tricuspid. Aortic valve regurgitation is trivial. Aortic valve sclerosis is present, with no evidence of aortic valve stenosis. Pulmonic Valve: The pulmonic valve was normal in structure. Pulmonic valve regurgitation is trivial. Aorta: The aortic root is normal in size and structure. Venous: The inferior vena cava is normal in size with greater than 50% respiratory variability, suggesting right atrial pressure of 3 mmHg. IAS/Shunts: No atrial level shunt detected by color flow Doppler. Additional Comments: 3D was performed not requiring image post processing on an independent workstation and was normal.  LEFT VENTRICLE PLAX 2D LVIDd:         4.20 cm   Diastology LVIDs:         2.50 cm   LV e' medial:    6.74 cm/s LV PW:         1.00 cm   LV E/e' medial:  9.5 LV IVS:        1.10 cm   LV e' lateral:   8.70 cm/s LVOT diam:     1.90 cm   LV E/e' lateral: 7.3 LV SV:         59 LV SV Index:   34 LVOT Area:     2.84 cm                           3D Volume EF:                          3D EF:        65 %                          LV EDV:       109 ml                          LV ESV:  38 ml                          LV SV:        71 ml RIGHT VENTRICLE             IVC RV S prime:     15.10 cm/s  IVC diam: 1.20 cm TAPSE (M-mode): 1.6 cm LEFT ATRIUM             Index LA diam:        4.40 cm 2.53 cm/m LA Vol (A2C):   35.4 ml 20.34 ml/m LA Vol (A4C):   41.1 ml 23.61 ml/m LA Biplane Vol: 38.7 ml 22.24 ml/m  AORTIC VALVE LVOT Vmax:   94.30 cm/s LVOT Vmean:  60.300 cm/s LVOT VTI:    0.209 m  AORTA Ao Root diam: 3.20 cm Ao Asc diam:  3.20 cm MITRAL VALVE               TRICUSPID VALVE MV Area (PHT): 3.53 cm    TR Peak grad:   30.9 mmHg MV Decel Time: 215 msec    TR Vmax:        278.00 cm/s MV E velocity: 63.90 cm/s MV A velocity: 67.10 cm/s  SHUNTS MV  E/A ratio:  0.95        Systemic VTI:  0.21 m                            Systemic Diam: 1.90 cm Salena Negri MD Electronically signed by Salena Negri MD Signature Date/Time: 10/26/2023/6:01:57 PM    Final    MR BRAIN WO CONTRAST Result Date: 10/26/2023 CLINICAL DATA:  Neuro deficit, acute, stroke suspected EXAM: MRI HEAD WITHOUT CONTRAST TECHNIQUE: Multiplanar, multiecho pulse sequences of the brain and surrounding structures were obtained without intravenous contrast. COMPARISON:  CT angiogram of the head dated October 25, 2023 and MRI of the head dated February 22, 2022. FINDINGS: Brain: There is restricted in diffusion involving the left precentral gyrus there is also a small acute subcortical lacunar infarct at the left temporal occipital junction, which is seen on image 69 of series 5. An additional more subtle focus of restricted diffusion is present within the left parietal white matter on image 79. There is age related volume loss and extensive diffuse cerebral white matter disease. There is no evidence of hemorrhage, mass or hydrocephalus. Vascular: Normal vascular flow voids. Skull and upper cervical spine: Normal marrow signal. No osseous lesions. Sinuses/Orbits: Clear paranasal sinuses. Status post bilateral lens replacement. Other: None. IMPRESSION: 1. Acute nonhemorrhagic infarct involving the left precentral gyrus. 2. Small lacunar white matter infarcts involving the left temporal occipital junction and the left parietal lobe. 3. Advanced cerebral white matter disease. Electronically Signed   By: Evalene Coho M.D.   On: 10/26/2023 12:20   CT ANGIO HEAD NECK W WO CM (CODE STROKE) Result Date: 10/25/2023 CLINICAL DATA:  Neuro deficit, acute, stroke suspected EXAM: CT ANGIOGRAPHY HEAD AND NECK WITH AND WITHOUT CONTRAST TECHNIQUE: Multidetector CT imaging of the head and neck was performed using the standard protocol during bolus administration of intravenous contrast. Multiplanar CT image  reconstructions and MIPs were obtained to evaluate the vascular anatomy. Carotid stenosis measurements (when applicable) are obtained utilizing NASCET criteria, using the distal internal carotid diameter as the denominator. RADIATION DOSE REDUCTION: This exam was performed according to the departmental dose-optimization program which includes automated  exposure control, adjustment of the mA and/or kV according to patient size and/or use of iterative reconstruction technique. CONTRAST:  75mL OMNIPAQUE  IOHEXOL  350 MG/ML SOLN COMPARISON:  None Available. FINDINGS: CTA NECK FINDINGS Aortic arch: Great vessel origins are patent. Aortic atherosclerosis. Right carotid system: No evidence of dissection, stenosis (50% or greater), or occlusion. Left carotid system: Approximately 50% stenosis of the left proximal ICA due to atherosclerosis. Vertebral arteries: Severe left vertebral artery origin stenosis. Patent bilaterally. Right dominant. Skeleton: No evidence of acute abnormality on limited assessment. Other neck: No evidence of acute abnormality on limited assessment. Upper chest: Visualized lung apices are clear. Review of the MIP images confirms the above findings CTA HEAD FINDINGS Anterior circulation: Bilateral intracranial ICAs, MCAs, and are patent without proximal hemodynamically significant stenosis. Posterior circulation: Bilateral intradural vertebral arteries, basilar artery and bilateral posterior arteries are patent without proximal 8 image least significant stenosis. Venous sinuses: As permitted by contrast timing, patent. Review of the MIP images confirms the above findings IMPRESSION: 1. No emergent large vessel occlusion. 2. Severe left vertebral artery origin stenosis. 3. 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 4. Aortic Atherosclerosis (ICD10-I70.0). Electronically Signed   By: Gilmore GORMAN Molt M.D.   On: 10/25/2023 22:53   CT HEAD CODE STROKE WO CONTRAST Result Date:  10/25/2023 CLINICAL DATA:  Code stroke.  Neuro deficit, acute, stroke suspected EXAM: CT HEAD WITHOUT CONTRAST TECHNIQUE: Contiguous axial images were obtained from the base of the skull through the vertex without intravenous contrast. RADIATION DOSE REDUCTION: This exam was performed according to the departmental dose-optimization program which includes automated exposure control, adjustment of the mA and/or kV according to patient size and/or use of iterative reconstruction technique. COMPARISON:  CT headMay 9, 2025. FINDINGS: Brain: Similar patchy hypodensities in the white matter and deep gray nuclei, compatible with chronic microvascular ischemic change. Small remote infarct in the left caudate superiorly. No evidence of acute large vascular territory infarct, acute hemorrhage, mass lesion or midline shift. Vascular: No hyperdense vessel. Skull: No acute fracture. Sinuses/Orbits: Clear sinuses.  No acute orbital findings. Other: No mastoid effusions. ASPECTS [facility] Stroke Program Early CT Score) Total score (0-10 with 10 being normal): 10. IMPRESSION: 1. No evidence of acute intracranial abnormality. ASPECTS is 10. 2. Chronic microvascular ischemic change. Code stroke imaging results were communicated on 10/25/2023 at 10:34 pm to provider Dr. Merrianne via secure text paging. Electronically Signed   By: Gilmore GORMAN Molt M.D.   On: 10/25/2023 22:35    Microbiology: Results for orders placed or performed during the hospital encounter of 02/22/22  Resp Panel by RT-PCR (Flu A&B, Covid) Anterior Nasal Swab     Status: None   Collection Time: 02/22/22  4:43 PM   Specimen: Anterior Nasal Swab  Result Value Ref Range Status   SARS Coronavirus 2 by RT PCR NEGATIVE NEGATIVE Final    Comment: (NOTE) SARS-CoV-2 target nucleic acids are NOT DETECTED.  The SARS-CoV-2 RNA is generally detectable in upper respiratory specimens during the acute phase of infection. The lowest concentration of SARS-CoV-2 viral  copies this assay can detect is 138 copies/mL. A negative result does not preclude SARS-Cov-2 infection and should not be used as the sole basis for treatment or other patient management decisions. A negative result may occur with  improper specimen collection/handling, submission of specimen other than nasopharyngeal swab, presence of viral mutation(s) within the areas targeted by this assay, and inadequate number of viral copies(<138 copies/mL). A negative result must be combined with clinical  observations, patient history, and epidemiological information. The expected result is Negative.  Fact Sheet for Patients:  BloggerCourse.com  Fact Sheet for Healthcare Providers:  SeriousBroker.it  This test is no t yet approved or cleared by the [place]  FDA and  has been authorized for detection and/or diagnosis of SARS-CoV-2 by FDA under an Emergency Use Authorization (EUA). This EUA will remain  in effect (meaning this test can be used) for the duration of the COVID-19 declaration under Section 564(b)(1) of the Act, 21 U.S.C.section 360bbb-3(b)(1), unless the authorization is terminated  or revoked sooner.       Influenza A by PCR NEGATIVE NEGATIVE Final   Influenza B by PCR NEGATIVE NEGATIVE Final    Comment: (NOTE) The Xpert Xpress SARS-CoV-2/FLU/RSV plus assay is intended as an aid in the diagnosis of influenza from Nasopharyngeal swab specimens and should not be used as a sole basis for treatment. Nasal washings and aspirates are unacceptable for Xpert Xpress SARS-CoV-2/FLU/RSV testing.  Fact Sheet for Patients: BloggerCourse.com  Fact Sheet for Healthcare Providers: SeriousBroker.it  This test is not yet approved or cleared by the [place]  FDA and has been authorized for detection and/or diagnosis of SARS-CoV-2 by FDA under an Emergency Use Authorization (EUA). This  EUA will remain in effect (meaning this test can be used) for the duration of the COVID-19 declaration under Section 564(b)(1) of the Act, 21 U.S.C. section 360bbb-3(b)(1), unless the authorization is terminated or revoked.  Performed at [facility], [address] [place], [place] [address]     Labs: CBC: Recent Labs  Lab 10/25/23 2215 10/25/23 2217  WBC 7.6  --   NEUTROABS 5.0  --   HGB 12.0 12.2  HCT 37.4 36.0  MCV 94.4  --   PLT 254  --    Basic Metabolic Panel: Recent Labs  Lab 10/25/23 2215 10/25/23 2217  NA 139 141  K 4.0 4.0  CL 105 107  CO2 24  --   GLUCOSE 93 88  BUN 17 18  CREATININE 0.92 1.00  CALCIUM  8.9  --    Liver Function Tests: Recent Labs  Lab 10/25/23 2215  AST 14*  ALT 18  ALKPHOS 81  BILITOT 1.0  PROT 6.4*  ALBUMIN 3.6   CBG: Recent Labs  Lab 10/25/23 2213  GLUCAP 96    Discharge time spent: {LESS THAN/GREATER THAN:26388} 30 minutes.  Signed: Concepcion Riser, MD [facility] 10/28/2023

## 2023-10-28 NOTE — Plan of Care (Signed)
  Problem: Education: Goal: Knowledge of disease or condition will improve Outcome: Progressing Goal: Knowledge of patient specific risk factors will improve (DELETE if not current risk factor) Outcome: Progressing   Problem: Coping: Goal: Will verbalize positive feelings about self Outcome: Progressing   Problem: Nutrition: Goal: Risk of aspiration will decrease Outcome: Progressing Goal: Dietary intake will improve Outcome: Progressing

## 2023-10-28 NOTE — Progress Notes (Signed)
 Occupational Therapy Treatment Patient Details Name: Holly Henry MRN: [id number] DOB: 1955/12/07 Today's Date: 10/28/2023   History of present illness Pt is a 68 y.o. female presenting of R sided weakness. CTH negative for acute findings. 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 MRI showed multiple lt cerebral infarcts. PMH significant for P A-fib, HTN, HLD, RA, GERD, IBS, anxiety/depression, prior CVA.   OT comments  Pt making steady progress towards OT goals this session. Pt continues to present with RUE hemiparesis and impaired balance. Pt currently requires MIN A for UB ADLS and MAX A for LB ADLs. Pt completes ADL transfers with RW and MINA, mild instability noted in R knee. Pt completed below therex with RUE to address proprioceptive deficits for improved ADL participation. Patient will benefit from intensive inpatient follow-up therapy, >3 hours/day        If plan is discharge home, recommend the following:  A lot of help with walking and/or transfers;A lot of help with bathing/dressing/bathroom;Assistance with cooking/housework;Assist for transportation;Help with stairs or ramp for entrance;Assistance with feeding   Equipment Recommendations  Other (comment) (defer)    Recommendations for Other Services      Precautions / Restrictions Precautions Precautions: Fall;Other (comment) Recall of Precautions/Restrictions: Intact Precaution/Restrictions Comments: watch bp Restrictions Weight Bearing Restrictions Per Provider Order: No       Mobility Bed Mobility Overal bed mobility: Needs Assistance Bed Mobility: Supine to Sit     Supine to sit: Min assist, HOB elevated     General bed mobility comments: light MIN A to elevate trunk    Transfers Overall transfer level: Needs assistance Equipment used: Rolling walker (2 wheels) Transfers: Sit to/from Stand Sit to Stand: Min assist           General transfer comment: MIN  A to rise from EOB with cues for hand placement     Balance Overall balance assessment: Needs assistance Sitting-balance support: Single extremity supported, Feet unsupported, No upper extremity supported Sitting balance-Leahy Scale: Fair Sitting balance - Comments: able to static sit with CGA but did experience posterior LOB with ADL task Postural control: Posterior lean Standing balance support: Bilateral upper extremity supported, During functional activity, Reliant on assistive device for balance Standing balance-Leahy Scale: Poor Standing balance comment: reliant on RW                           ADL either performed or assessed with clinical judgement   ADL Overall ADL's : Needs assistance/impaired Eating/Feeding: Set up;Sitting Eating/Feeding Details (indicate cue type and reason): set- up to open lids and set- up coffee     Upper Body Bathing: Minimal assistance;Sitting Upper Body Bathing Details (indicate cue type and reason): simulated via UB dressing Lower Body Bathing: Maximal assistance;Sit to/from stand Lower Body Bathing Details (indicate cue type and reason): simulated via LB dressing Upper Body Dressing : Minimal assistance;Sitting Upper Body Dressing Details (indicate cue type and reason): to don back side gown Lower Body Dressing: Maximal assistance;Sit to/from stand Lower Body Dressing Details (indicate cue type and reason): attempted to don socsk from EOB but had LOB posteriorly Toilet Transfer: Minimal assistance;Rolling walker (2 wheels);Ambulation Toilet Transfer Details (indicate cue type and reason): simulated via functioanl mobility         Functional mobility during ADLs: Minimal assistance;Rolling walker (2 wheels) General ADL Comments: ADL participation impacted mostly by impaired balance and RUE hemiparesis    Extremity/Trunk Assessment  Upper Extremity Assessment Upper Extremity Assessment: RUE deficits/detail;Right hand dominant RUE  Deficits / Details: grossly 2-/5. pt able to complete lap slides with + time, pt abel to demonstrate gross opposition with + time and hold Rw, seems to have more of a proprioceptive issue RUE Sensation: decreased proprioception RUE Coordination: decreased fine motor;decreased gross motor   Lower Extremity Assessment Lower Extremity Assessment: Defer to PT evaluation   Cervical / Trunk Assessment Cervical / Trunk Assessment: Normal    Vision Baseline Vision/History: 1 Wears glasses Ability to See in Adequate Light: 0 Adequate Patient Visual Report: No change from baseline Vision Assessment?: No apparent visual deficits   Perception     Praxis Praxis Praxis: Impaired Praxis Impairment Details: Motor planning   Communication Communication Communication: No apparent difficulties   Cognition Arousal: Alert Behavior During Therapy: WFL for tasks assessed/performed Cognition: No apparent impairments             OT - Cognition Comments: pleasant, conversational, oriented                 Following commands: Intact        Cueing   Cueing Techniques: Verbal cues  Exercises Other Exercises Other Exercises: lap slides with an emphasis on active assist ROM via scapular protraction/retraction Other Exercises: issued pt compliant ball to work on functional digit flexion/extension Other Exercises: pt completed seated wrist supination/pronation with RUE    Shoulder Instructions       General Comments      Pertinent Vitals/ Pain       Pain Assessment Pain Assessment: 0-10 Pain Score: 7  Pain Location: neck and upper back Pain Descriptors / Indicators: Aching, Discomfort, Sore Pain Intervention(s): Limited activity within patient's tolerance, Monitored during session, Repositioned, RN gave pain meds during session  Home Living                                          Prior Functioning/Environment              Frequency  Min 2X/week         Progress Toward Goals  OT Goals(current goals can now be found in the care plan section)  Progress towards OT goals: Progressing toward goals  Acute Rehab OT Goals Patient Stated Goal: to go to rehab OT Goal Formulation: With patient Time For Goal Achievement: 11/09/23 Potential to Achieve Goals: Good  Plan      Co-evaluation                 AM-PAC OT 6 Clicks Daily Activity     Outcome Measure   Help from another person eating meals?: A Little Help from another person taking care of personal grooming?: A Little Help from another person toileting, which includes using toliet, bedpan, or urinal?: A Lot Help from another person bathing (including washing, rinsing, drying)?: A Lot Help from another person to put on and taking off regular upper body clothing?: A Little Help from another person to put on and taking off regular lower body clothing?: A Lot 6 Click Score: 15    End of Session Equipment Utilized During Treatment: Gait belt;Rolling walker (2 wheels)  OT Visit Diagnosis: Unsteadiness on feet (R26.81);Muscle weakness (generalized) (M62.81);Hemiplegia and hemiparesis Hemiplegia - Right/Left: Right Hemiplegia - dominant/non-dominant: Dominant   Activity Tolerance Patient tolerated treatment well   Patient Left in chair;with call bell/phone within  reach;with chair alarm set   Nurse Communication Mobility status        Time: 0802-0826 OT Time Calculation (min): 24 min  Charges: OT General Charges $OT Visit: 1 Visit OT Treatments $Self Care/Home Management : 8-22 mins $Therapeutic Exercise: 8-22 mins  Ronal Mallie POUR., COTA/L Acute Rehabilitation Services [phone]   Ronal Mallie Needy 10/28/2023, 10:17 AM

## 2023-10-28 NOTE — Progress Notes (Signed)
 Upon admission patient has 4 separate areas that are stage 1 pressure injuries. The first stage 1 at the bottom of the buttocks near the sacrum is 2 x 1. The one directly above this one coming superior is 1.5 x 1. The small one directly above that is .5 x .5. The even smaller one directly above that is .3 x .3. All of the stage 1 pressure injuries are in a row, as shown in the picture attached.

## 2023-10-28 NOTE — Progress Notes (Signed)
 Patient discharged to IP Rehab 4W

## 2023-10-29 DIAGNOSIS — G8191 Hemiplegia, unspecified affecting right dominant side: Secondary | ICD-10-CM

## 2023-10-29 DIAGNOSIS — I482 Chronic atrial fibrillation, unspecified: Secondary | ICD-10-CM | POA: Diagnosis not present

## 2023-10-29 DIAGNOSIS — I639 Cerebral infarction, unspecified: Secondary | ICD-10-CM | POA: Diagnosis not present

## 2023-10-29 DIAGNOSIS — M069 Rheumatoid arthritis, unspecified: Secondary | ICD-10-CM

## 2023-10-29 LAB — COMPREHENSIVE METABOLIC PANEL WITH GFR
ALT: 16 U/L (ref 0–44)
AST: 11 U/L — ABNORMAL LOW (ref 15–41)
Albumin: 3.2 g/dL — ABNORMAL LOW (ref 3.5–5.0)
Alkaline Phosphatase: 77 U/L (ref 38–126)
Anion gap: 9 (ref 5–15)
BUN: 27 mg/dL — ABNORMAL HIGH (ref 8–23)
CO2: 24 mmol/L (ref 22–32)
Calcium: 8.8 mg/dL — ABNORMAL LOW (ref 8.9–10.3)
Chloride: 104 mmol/L (ref 98–111)
Creatinine, Ser: 1.08 mg/dL — ABNORMAL HIGH (ref 0.44–1.00)
GFR, Estimated: 56 mL/min — ABNORMAL LOW (ref >60)
Glucose, Bld: 112 mg/dL — ABNORMAL HIGH (ref 70–99)
Potassium: 4.1 mmol/L (ref 3.5–5.1)
Sodium: 137 mmol/L (ref 135–145)
Total Bilirubin: 0.9 mg/dL (ref 0.0–1.2)
Total Protein: 5.8 g/dL — ABNORMAL LOW (ref 6.5–8.1)

## 2023-10-29 LAB — CBC WITH DIFFERENTIAL/PLATELET
Abs Immature Granulocytes: 0.03 K/uL (ref 0.00–0.07)
Basophils Absolute: 0.1 K/uL (ref 0.0–0.1)
Basophils Relative: 1 %
Eosinophils Absolute: 0.3 K/uL (ref 0.0–0.5)
Eosinophils Relative: 4 %
HCT: 36.1 % (ref 36.0–46.0)
Hemoglobin: 11.6 g/dL — ABNORMAL LOW (ref 12.0–15.0)
Immature Granulocytes: 0 %
Lymphocytes Relative: 28 %
Lymphs Abs: 2.2 K/uL (ref 0.7–4.0)
MCH: 30.2 pg (ref 26.0–34.0)
MCHC: 32.1 g/dL (ref 30.0–36.0)
MCV: 94 fL (ref 80.0–100.0)
Monocytes Absolute: 0.9 K/uL (ref 0.1–1.0)
Monocytes Relative: 11 %
Neutro Abs: 4.4 K/uL (ref 1.7–7.7)
Neutrophils Relative %: 56 %
Platelets: 245 K/uL (ref 150–400)
RBC: 3.84 MIL/uL — ABNORMAL LOW (ref 3.87–5.11)
RDW: 12.8 % (ref 11.5–15.5)
WBC: 7.8 K/uL (ref 4.0–10.5)
nRBC: 0 % (ref 0.0–0.2)

## 2023-10-29 MED ORDER — VITAMIN C 500 MG PO TABS
500.0000 mg | ORAL_TABLET | Freq: Two times a day (BID) | ORAL | Status: DC
  Administered 2023-10-29 – 2023-11-11 (×27): 500 mg via ORAL
  Filled 2023-10-29 (×27): qty 1

## 2023-10-29 MED ORDER — METHENAMINE MANDELATE 0.5 G PO TABS
500.0000 mg | ORAL_TABLET | Freq: Two times a day (BID) | ORAL | Status: DC
  Administered 2023-10-29 – 2023-11-11 (×27): 500 mg via ORAL
  Filled 2023-10-29 (×28): qty 1

## 2023-10-29 NOTE — Plan of Care (Signed)
  Problem: RH Balance Goal: LTG Patient will maintain dynamic standing with ADLs (OT) Description: LTG:  Patient will maintain dynamic standing balance with assist during activities of daily living (OT)  Flowsheets (Taken 10/29/2023 1247) LTG: Pt will maintain dynamic standing balance during ADLs with: Supervision/Verbal cueing   Problem: Sit to Stand Goal: LTG:  Patient will perform sit to stand in prep for activites of daily living with assistance level (OT) Description: LTG:  Patient will perform sit to stand in prep for activites of daily living with assistance level (OT) Flowsheets (Taken 10/29/2023 1247) LTG: PT will perform sit to stand in prep for activites of daily living with assistance level: Independent with assistive device   Problem: RH Grooming Goal: LTG Patient will perform grooming w/assist,cues/equip (OT) Description: LTG: Patient will perform grooming with assist, with/without cues using equipment (OT) Flowsheets (Taken 10/29/2023 1247) LTG: Pt will perform grooming with assistance level of: Supervision/Verbal cueing   Problem: RH Bathing Goal: LTG Patient will bathe all body parts with assist levels (OT) Description: LTG: Patient will bathe all body parts with assist levels (OT) Flowsheets (Taken 10/29/2023 1247) LTG: Pt will perform bathing with assistance level/cueing: Supervision/Verbal cueing   Problem: RH Dressing Goal: LTG Patient will perform upper body dressing (OT) Description: LTG Patient will perform upper body dressing with assist, with/without cues (OT). Flowsheets (Taken 10/29/2023 1247) LTG: Pt will perform upper body dressing with assistance level of: Independent with assistive device Goal: LTG Patient will perform lower body dressing w/assist (OT) Description: LTG: Patient will perform lower body dressing with assist, with/without cues in positioning using equipment (OT) Flowsheets (Taken 10/29/2023 1247) LTG: Pt will perform lower body dressing with  assistance level of: Supervision/Verbal cueing   Problem: RH Toileting Goal: LTG Patient will perform toileting task (3/3 steps) with assistance level (OT) Description: LTG: Patient will perform toileting task (3/3 steps) with assistance level (OT)  Flowsheets (Taken 10/29/2023 1247) LTG: Pt will perform toileting task (3/3 steps) with assistance level: Supervision/Verbal cueing   Problem: RH Functional Use of Upper Extremity Goal: LTG Patient will use RT/LT upper extremity as a (OT) Description: LTG: Patient will use right/left upper extremity as a stabilizer/gross assist/diminished/nondominant/dominant level with assist, with/without cues during functional activity (OT) Flowsheets (Taken 10/29/2023 1247) LTG: Use of upper extremity in functional activities: RUE as diminished level LTG: Pt will use upper extremity in functional activity with assistance level of: Supervision/Verbal cueing   Problem: RH Simple Meal Prep Goal: LTG Patient will perform simple meal prep w/assist (OT) Description: LTG: Patient will perform simple meal prep with assistance, with/without cues (OT). Flowsheets (Taken 10/29/2023 1247) LTG: Pt will perform simple meal prep with assistance level of: Minimal Assistance - Patient > 75%   Problem: RH Toilet Transfers Goal: LTG Patient will perform toilet transfers w/assist (OT) Description: LTG: Patient will perform toilet transfers with assist, with/without cues using equipment (OT) Flowsheets (Taken 10/29/2023 1247) LTG: Pt will perform toilet transfers with assistance level of: Supervision/Verbal cueing   Problem: RH Tub/Shower Transfers Goal: LTG Patient will perform tub/shower transfers w/assist (OT) Description: LTG: Patient will perform tub/shower transfers with assist, with/without cues using equipment (OT) Flowsheets (Taken 10/29/2023 1247) LTG: Pt will perform tub/shower stall transfers with assistance level of: Contact Guard/Touching assist

## 2023-10-29 NOTE — Progress Notes (Signed)
 Inpatient Rehabilitation Admission Medication Review by a Pharmacist  A complete drug regimen review was completed for this patient to identify any potential clinically significant medication issues.  High Risk Drug Classes Is patient taking? Indication by Medication  Antipsychotic No   Anticoagulant Yes Dabigatran  - Afib   Antibiotic Yes Methenamine  - UTI ppx   Opioid Yes Oxycodone  - pain   Antiplatelet No   Hypoglycemics/insulin No   Vasoactive Medication No   Chemotherapy No   Other Yes Acetaminophen  - pain/fever  Atorvastatin , ezetimibe  - hyperlipidemia  Buspirone  - anti-anxiety   Pantoprazole  - GERD  Polyethylene glycol, sorbitol  - constipation   Gabapentin , duloxetine  - Hx of fibromyalgia     Type of Medication Issue Identified Description of Issue Recommendation(s)  Drug Interaction(s) (clinically significant)     Duplicate Therapy     Allergy     No Medication Administration End Date     Incorrect Dose     Additional Drug Therapy Needed     Significant med changes from prior encounter (inform family/care partners about these prior to discharge). Gabapentin  300 mg TID PTA, currently on 100 mg TID   PTA meds: Metoprolol 25 mg, flecainide and lasix  not  - PTA med not ordered.   PTA med Aspirin and Eliquis > stopped and  changed to Dabigatran  therapy on [facility] acute admission.  Consider gradual increase of gabapentin  to PTA home dose.  Consider resuming Metoprolol given hypertension.   Communicate to patient /family/ caregiver prior to discharge.  Other       Clinically significant medication issues were identified that warrant physician communication and completion of prescribed/recommended actions by midnight of the next day:  Yes>No.  Resolved.   Name of Provider notfied:  Dr. Carilyn  Provider method of notification:  secure chat  Pharmacy comments:  PTA  medications Metoprolol tartrate and  Flecainide not resumed. Patient not takiing either medication  on [facility] acute admission. Asking Dr. Carilyn to clarify is these medications should be resumed as progress notes indicate to continue metoprolol , but no order.   Indication: afib, HTN.   Resolved:  Dr. Carilyn said to hold metoprolol and flecainide meds for now.   Time spent performing this drug regimen review (minutes):  30 min   Thank you,  Feliciano Close, PharmD PGY2 Infectious Diseases Pharmacy Resident    Levorn Gaskins, RPh Clinical Pharmacist 10/29/2023 2:58 PM

## 2023-10-29 NOTE — Progress Notes (Incomplete)
 Inpatient Rehabilitation Admission Medication Review by a Pharmacist  A complete drug regimen review was completed for this patient to identify any potential clinically significant medication issues.  High Risk Drug Classes Is patient taking? Indication by Medication  Antipsychotic No   Anticoagulant Yes Dabigatran  - Afib   Antibiotic Yes Methenamine  - UTI ppx   Opioid Yes Oxycodone  - pain   Antiplatelet No   Hypoglycemics/insulin No   Vasoactive Medication No   Chemotherapy No   Other Yes Acetaminophen  - pain/fever  Atorvastatin , ezetimibe  - hyperlipidemia  Buspirone  - anti-anxiety   Pantoprazole  - GERD  Polyethylene glycol, sorbitol  - constipation   Gabapentin , duloxetine  - Hx of fibromyalgia     Type of Medication Issue Identified Description of Issue Recommendation(s)  Drug Interaction(s) (clinically significant)     Duplicate Therapy     Allergy  Buprenorphine (documented causing psychosis, mental status change)   No Medication Administration End Date     Incorrect Dose     Additional Drug Therapy Needed     Significant med changes from prior encounter (inform family/care partners about these prior to discharge). Gabapentin  300 mg TID PTA, currently on 100 mg TID  Consider gradual increase to PTA home dose   Other  Metoprolol 25 mg - PTA med not ordered Consider resuming given hypertension    Clinically significant medication issues were identified that warrant physician communication and completion of prescribed/recommended actions by midnight of the next day:  No  Time spent performing this drug regimen review (minutes):  20 min   Thank you,  Feliciano Close, PharmD PGY2 Infectious Diseases Pharmacy Resident

## 2023-10-29 NOTE — Progress Notes (Signed)
 Inpatient Rehabilitation Center Individual Statement of Services  Patient Name:  Holly Henry  Date:  10/29/2023  Welcome to the Inpatient Rehabilitation Center.  Our goal is to provide you with an individualized program based on your diagnosis and situation, designed to meet your specific needs.  With this comprehensive rehabilitation program, you will be expected to participate in at least 3 hours of rehabilitation therapies Monday-Friday, with modified therapy programming on the weekends.  Your rehabilitation program will include the following services:  Physical Therapy (PT), Occupational Therapy (OT), 24 hour per day rehabilitation nursing, Therapeutic Recreaction (TR), Neuropsychology, Care Coordinator, Rehabilitation Medicine, Nutrition Services, and Pharmacy Services  Weekly team conferences will be held on Wednesday to discuss your progress.  Your Inpatient Rehabilitation Care Coordinator will talk with you frequently to get your input and to update you on team discussions.  Team conferences with you and your family in attendance may also be held.  Expected length of stay: 10-14 days  Overall anticipated outcome: mod/I-supervision level  Depending on your progress and recovery, your program may change. Your Inpatient Rehabilitation Care Coordinator will coordinate services and will keep you informed of any changes. Your Inpatient Rehabilitation Care Coordinator's name and contact numbers are listed  below.  The following services may also be recommended but are not provided by the Inpatient Rehabilitation Center:  Driving Evaluations Home Health Rehabiltiation Services Outpatient Rehabilitation Services    Arrangements will be made to provide these services after discharge if needed.  Arrangements include referral to agencies that provide these services.  Your insurance has been verified to be:  [employer] Your primary doctor is:  Glendale Fickle  Pertinent information will be  shared with your doctor and your insurance company.  Inpatient Rehabilitation Care Coordinator:  Rhoda Clement, KEN [phone] or ELIGAH BASQUES  Information discussed with and copy given to patient by: Clement Asberry [name], 10/29/2023, 10:27 AM

## 2023-10-29 NOTE — Progress Notes (Signed)
 Inpatient Rehabilitation Care Coordinator Assessment and Plan Patient Details  Name: Holly Henry MRN: [id number] Date of Birth: [birth year]  Today's Date: 10/29/2023  Hospital Problems: Principal Problem:   Ischemic cerebrovascular accident (CVA) [facility]  Past Medical History:  Past Medical History:  Diagnosis Date   A-fib (HCC)    Fibromyalgia    High cholesterol    Hypertension    Osteoarthritis    Prediabetes    Rheumatoid arthritis (HCC)    Past Surgical History:  Past Surgical History:  Procedure Laterality Date   BACK SURGERY     CATARACT EXTRACTION Bilateral    TONSILLECTOMY     Social History:  reports that she has never smoked. She has never used smokeless tobacco. She reports that she does not drink alcohol and does not use drugs.  Family / Support Systems Marital Status: Divorced Patient Roles: Parent, Other (Comment) (grandmother) Children: Holly Henry [phone]  Another daughter in [place] Other Supports: Three grandson's live with also. 20, 13 and 2 yo Anticipated Caregiver: Daughtrer Ability/Limitations of Caregiver: Daughrer has health issues and applying for disability Caregiver Availability: 24/7 Family Dynamics: Close knit with both daughter's all moved her from [place] three years ago due to daughter's job and lower cost of living.  Social History Preferred language: English Religion: Christian Cultural Background: NA Education: HS Health Literacy - How often do you need to have someone help you when you read instructions, pamphlets, or other written material from your doctor or pharmacy?: Never Writes: Yes Employment Status: Retired Marine scientist Issues: NA Guardian/Conservator: None-according to MD pt is capable of making her own decisions while here   Abuse/Neglect Abuse/Neglect Assessment Can Be Completed: Yes Physical Abuse: Denies Verbal Abuse: Denies Sexual Abuse: Denies Exploitation of patient/patient's resources:  Denies Self-Neglect: Denies  Patient response to: Social Isolation - How often do you feel lonely or isolated from those around you?: Never  Emotional Status Pt's affect, behavior and adjustment status: Pt is motivated to do well and recover she is encourged by the progress she has already made. She hopes to be mod/i by DC Recent Psychosocial Issues: other health issues Psychiatric History: Hx-depression/anxiety takes medications for this and finds them helpful Substance Abuse History: NA  Patient / Family Perceptions, Expectations & Goals Pt/Family understanding of illness & functional limitations: Pt is able to explain her stroke and deficits, she does talk with the MD involved and feels understands her plan moving forward and is not one to hesitate to ask questions. Premorbid pt/family roles/activities: mom, grandmother, retiree, etc Anticipated changes in roles/activities/participation: resume Pt/family expectations/goals: Pt states:  I hope to do well and not need assist when I leave here.  Community [employer] Agencies: None Premorbid Home Care/DME Agencies: Other (Comment) (grab bars in bathroom) Transportation available at discharge: self and daughter Is the patient able to respond to transportation needs?: Yes In the past 12 months, has lack of transportation kept you from medical appointments or from getting medications?: No In the past 12 months, has lack of transportation kept you from meetings, work, or from getting things needed for daily living?: No Resource referrals recommended: Neuropsychology  Discharge Planning Living Arrangements: Children, Other relatives Support Systems: Children, Other relatives, Friends/neighbors Type of Residence: Private residence Insurance Resources: Media planner (specify) ([employer] Medicare) Financial Resources: Tree surgeon, Family Support Financial Screen Referred: No Living Expenses: Lives with family Money  Management: Patient, Family Does the patient have any problems obtaining your medications?: No Home Management: both Patient/Family Preliminary Plans: Return home with  daughter and grandson's, daughter is not working and can assist but does have health issues and is applying for disability for herself. Being evaluated today and goals being set for stay here. Care Coordinator Anticipated Follow Up Needs: HH/OP  Clinical Impression Pleasant motivated female who is making progress already which is encouraging to her. She is hopeful she will get to mod/I level before leaving here. Await team's evaluations  Holly Henry [name] 10/29/2023, 10:26 AM

## 2023-10-29 NOTE — Progress Notes (Signed)
 Inpatient Rehabilitation  Patient information reviewed and entered into eRehab system by [facility]. Karen Kays., CCC/SLP, PPS Coordinator.  Information including medical coding, functional ability and quality indicators will be reviewed and updated through discharge.

## 2023-10-29 NOTE — Evaluation (Signed)
 Occupational Therapy Assessment and Plan  Patient Details  Name: Holly Henry MRN: [id number] Date of Birth: Nov 25, 1955  OT Diagnosis: acute pain, hemiplegia affecting dominant side, muscle weakness (generalized), and pain in thoracic spine Rehab Potential: Rehab Potential (ACUTE ONLY): Good ELOS: ~2-2.5 weeks   Today's Date: 10/29/2023 OT Individual Time: 9151-9040 OT Individual Time Calculation (min): 71 min    Session 2:  OT Individual Time: 8896-8798 OT Individual Time Calculation (min): 58 min     Hospital Problem: Principal Problem:   Ischemic cerebrovascular accident (CVA) (HCC)   Past Medical History:  Past Medical History:  Diagnosis Date   A-fib (HCC)    Fibromyalgia    High cholesterol    Hypertension    Osteoarthritis    Prediabetes    Rheumatoid arthritis (HCC)    Past Surgical History:  Past Surgical History:  Procedure Laterality Date   BACK SURGERY     CATARACT EXTRACTION Bilateral    TONSILLECTOMY      Assessment & Plan Clinical Impression: Holly Henry is a 68 year old right-handed female with history significant for paroxysmal atrial fibrillation on Eliquis , hypertension, hyperlipidemia, rheumatoid arthritis, GERD, IBS, anxiety/depression maintained on BuSpar , prior TIA/CVA, prediabetes. Per chart review patient lives with daughter and grandchildren. Independent prior to admission but did have occasional fall over the last 6 months. To level home, laundry/work area and basement. Patient able to stay on main level. One-step to entry. Presented 10/25/2023 with acute onset of right sided weakness and sensory loss. Blood pressure 150/106 per EMS. Cranial CT scan negative for acute changes. Chronic microvascular ischemic changes. CTA showed no emergent large vessel occlusion. Patient did not receive TNK. MRI of the brain showed acute nonhemorrhagic infarct involving the left precentral gyrus. Small lacunar white matter infarct involving the left temporal occipital  junction and the left parietal lobe. Admission chemistries unremarkable, hemoglobin A1c 6.0. Echocardiogram with ejection fraction of 65 to 70% no wall motion abnormalities grade 1 diastolic dysfunction. Neurology follow-up felt left frontal, temporoparietal infarct secondary to atrial fibrillation while on OAC likely Eliquis  failure transition to Pradaxa . Tolerating a regular consistency diet. Patient reports she was previously on methotrexate for rheumatoid arthritis but this was discontinued due to possible side effects about 6 months ago.  Patient transferred to CIR on 10/28/2023 .    Patient currently requires max with basic self-care skills and IADL secondary to muscle weakness, decreased cardiorespiratoy endurance, impaired timing and sequencing, abnormal tone, unbalanced muscle activation, decreased coordination, and decreased motor planning, decreased motor planning, decreased safety awareness, central origin, and decreased sitting balance, decreased standing balance, decreased postural control, hemiplegia, and decreased balance strategies.  Prior to hospitalization, patient could complete ADLs, IADLs, child care, and drive with independent .  Patient will benefit from skilled intervention to decrease level of assist with basic self-care skills, increase independence with basic self-care skills, and increase level of independence with iADL prior to discharge home with care partner.  Anticipate patient will require intermittent supervision and follow up outpatient.  OT - End of Session Activity Tolerance: Decreased this session Endurance Deficit: Yes OT Assessment Rehab Potential (ACUTE ONLY): Good OT Patient demonstrates impairments in the following area(s): Balance;Pain;Safety;Endurance;Motor;Skin Integrity;Perception OT Basic ADL's Functional Problem(s): Grooming;Bathing;Dressing;Toileting OT Advanced ADL's Functional Problem(s): Simple Meal Preparation OT Transfers Functional Problem(s):  Tub/Shower;Toilet OT Additional Impairment(s): Fuctional Use of Upper Extremity OT Plan OT Intensity: Minimum of 1-2 x/day, 45 to 90 minutes OT Frequency: 5 out of 7 days OT Duration/Estimated Length of Stay: ~2-2.5 weeks OT Treatment/Interventions:  Balance/vestibular training;Community reintegration;Disease mangement/prevention;Neuromuscular re-education;Functional electrical stimulation;Patient/family education;Self Care/advanced ADL retraining;Splinting/orthotics;Therapeutic Exercise;UE/LE Coordination activities;Wheelchair propulsion/positioning;Cognitive remediation/compensation;Discharge planning;DME/adaptive equipment instruction;Functional mobility training;Pain management;Psychosocial support;Skin care/wound managment;Therapeutic Activities;UE/LE Strength taining/ROM;Visual/perceptual remediation/compensation OT Self Feeding Anticipated Outcome(s): Mod I OT Basic Self-Care Anticipated Outcome(s): Mod I-SUP OT Toileting Anticipated Outcome(s): SUP OT Bathroom Transfers Anticipated Outcome(s): SUP OT Recommendation Recommendations for Other Services: Therapeutic Recreation consult Therapeutic Recreation Interventions: Other (comment);Pet therapy (dance) Patient destination: Home Follow Up Recommendations: Home health OT Equipment Recommended: To be determined   OT Evaluation Precautions/Restrictions  Precautions Precautions: Fall;Other (comment) Recall of Precautions/Restrictions: Intact Precaution/Restrictions Comments: watch bp Restrictions Weight Bearing Restrictions Per Provider Order: No General Chart Reviewed: Yes Additional Pertinent History: paroxysmal atrial fibrillation on Eliquis , hypertension, hyperlipidemia, rheumatoid arthritis, GERD, IBS,  anxiety/depression maintained on BuSpar , prior TIA/CVA, prediabetes Family/Caregiver Present: No Pain Pain Assessment Pain Score: 5  Pain Type: Acute pain;Chronic pain Pain Location: Shoulder Pain Orientation: Right Home  Living/Prior Functioning Home Living Family/patient expects to be discharged to:: Private residence Living Arrangements: Children, Other relatives Available Help at Discharge: Family, Available 24 hours/day Type of Home: House Home Access: Stairs to enter [employer] of Steps: 1 Entrance Stairs-Rails: None Home Layout: Two level, Laundry or work area in basement, Able to live on main level with bedroom/bathroom Alternate Level Stairs-Number of Steps: flight Alternate Level Stairs-Rails: Right Bathroom Shower/Tub: Engineer, manufacturing systems: Standard Bathroom Accessibility: Yes Additional Comments: May have a w/c available  Lives With: Family IADL History Homemaking Responsibilities: Yes Meal Prep Responsibility: Primary Laundry Responsibility: Secondary Cleaning Responsibility: Secondary Bill Paying/Finance Responsibility: Primary Shopping Responsibility: Secondary Child Care Responsibility: Secondary Current License: Yes Mode of Transportation: Car Education: bachelor's degree - sociology and bible Occupation: Retired, On disability Prior Function Level of Independence: Independent with basic ADLs, Independent with transfers, Independent with gait  Able to Take Stairs?: Yes Driving: Yes Vocation: Retired Administrator, sports Baseline Vision/History: 1 Wears glasses Ability to See in Adequate Light: 0 Adequate Patient Visual Report: No change from baseline Vision Assessment?: No apparent visual deficits Perception  Perception: Within Functional Limits (will continue to assess) Praxis Praxis: Impaired Praxis Impairment Details: Motor planning Cognition Cognition Overall Cognitive Status: Within Functional Limits for tasks assessed Arousal/Alertness: Awake/alert Orientation Level: Person;Place;Situation Person: Oriented Place: Oriented Situation: Oriented Memory: Appears intact Attention: Sustained Sustained Attention: Appears intact Awareness: Appears  intact Problem Solving: Appears intact Safety/Judgment: Impaired Brief Interview for Mental Status (BIMS) Repetition of Three Words (First Attempt): 3 Temporal Orientation: Year: Correct Temporal Orientation: Month: Accurate within 5 days Temporal Orientation: Day: Correct Recall: Sock: Yes, no cue required Recall: Blue: Yes, no cue required Recall: Bed: Yes, no cue required BIMS Summary Score: 15 Sensation Sensation Light Touch: Appears Intact (will continue to assess) Hot/Cold: Appears Intact Proprioception: Appears Intact Stereognosis: Not tested Coordination Gross Motor Movements are Fluid and Coordinated: No Fine Motor Movements are Fluid and Coordinated: No Coordination and Movement Description: decreased balance and coordination of R LE Finger Nose Finger Test: unable to compelte on R, smooth equal movemetns on L UE Motor  Motor Motor: Hemiplegia Motor - Skilled Clinical Observations: R U>LE hemiplegia  Trunk/Postural Assessment  Cervical Assessment Cervical Assessment: Exceptions to [facility] (forward head) Thoracic Assessment Thoracic Assessment: Exceptions to [facility] (rounded shoulders with kyphotic posture) Lumbar Assessment Lumbar Assessment: Exceptions to [facility] (posterior pelvic tilt) Postural Control Postural Control: Deficits on evaluation Trunk Control: decreased Righting Reactions: delayed Protective Responses: delayed  Balance Balance Balance Assessed: Yes Static Sitting Balance Static Sitting - Balance Support: Feet unsupported Static Sitting -  Level of Assistance: 5: Stand by assistance Dynamic Sitting Balance Dynamic Sitting - Balance Support: During functional activity Dynamic Sitting - Level of Assistance: 5: Stand by assistance;4: Min assist (CGA-MIN A) Static Standing Balance Static Standing - Balance Support: During functional activity;Right upper extremity supported Static Standing - Level of Assistance: 4: Min assist (CGA-min A) Dynamic  Standing Balance Dynamic Standing - Balance Support: During functional activity;Right upper extremity supported Dynamic Standing - Level of Assistance: 4: Min assist Extremity/Trunk Assessment RUE Assessment RUE Assessment: Exceptions to [facility] Passive Range of Motion (PROM) Comments: pain at shoulder during shoulder flexion/abduction so only assessed to 90* General Strength Comments: 2-/5 deltoid, 3-/5 biceps, 2+/5 triceps, 2/5 grip LUE Assessment LUE Assessment: Within Functional Limits General Strength Comments: 4/5 d/t general deconditioning  Care Tool Care Tool Self Care Eating   Eating Assist Level: Set up assist    Oral Care    Oral Care Assist Level: Set up assist    Bathing   Body parts bathed by patient: Right arm;Chest;Abdomen;Front perineal area;Right upper leg;Left upper leg Body parts bathed by helper: Left arm;Buttocks;Right lower leg;Left lower leg   Assist Level: Moderate Assistance - Patient 50 - 74%    Upper Body Dressing(including orthotics)   What is the patient wearing?: Pull over shirt   Assist Level: Moderate Assistance - Patient 50 - 74%    Lower Body Dressing (excluding footwear)   What is the patient wearing?: Underwear/pull up;Pants Assist for lower body dressing: Maximal Assistance - Patient 25 - 49%    Putting on/Taking off footwear   What is the patient wearing?: Socks Assist for footwear: Total Assistance - Patient < 25%       Care Tool Toileting Toileting activity   Assist for toileting: Maximal Assistance - Patient 25 - 49%     Care Tool Bed Mobility Roll left and right activity   Roll left and right assist level: Minimal Assistance - Patient > 75%    Sit to lying activity   Sit to lying assist level: Minimal Assistance - Patient > 75%    Lying to sitting on side of bed activity   Lying to sitting on side of bed assist level: the ability to move from lying on the back to sitting on the side of the bed with no back support.: Minimal  Assistance - Patient > 75%     Care Tool Transfers Sit to stand transfer   Sit to stand assist level: Minimal Assistance - Patient > 75%    Chair/bed transfer   Chair/bed transfer assist level: Minimal Assistance - Patient > 75%     Toilet transfer   Assist Level: Minimal Assistance - Patient > 75%     Care Tool Cognition  Expression of Ideas and Wants Expression of Ideas and Wants: 4. Without difficulty (complex and basic) - expresses complex messages without difficulty and with speech that is clear and easy to understand  Understanding Verbal and Non-Verbal Content Understanding Verbal and Non-Verbal Content: 4. Understands (complex and basic) - clear comprehension without cues or repetitions   Memory/Recall Ability Memory/Recall Ability : Current season;Location of own room;Staff names and faces;That he or she is in a hospital/hospital unit   Refer to Care Plan for Long Term Goals  SHORT TERM GOAL WEEK 1 OT Short Term Goal 1 (Week 1): Pt will recall hemidressing techniques with min questioning cues OT Short Term Goal 2 (Week 1): Pt will maintain R UE in safe position during functional transfers, ADLs, and  when sitting in wc with min verbal cues OT Short Term Goal 3 (Week 1): Pt will complete LB dresing with min A using AE and LRAD PRN OT Short Term Goal 4 (Week 1): Pt will complete toilet transfers with CGA using LRAD  Recommendations for other services: Therapeutic Recreation  Pet therapy and Other dance    Skilled Therapeutic Intervention Skilled Therapeutic Interventions/Progress Updates:  1:1 OT evaluation and intervention initiated with skilled education provided on OT role, goals, and POC. Pt received semi-reclined in bed presenting to be in good spirits receptive to skilled OT session reporting 5/10 pain in R shoulder during - OT offering intermittent rest breaks, repositioning, and therapeutic support to optimize participation in therapy session. Pt completed ADLs at  levels listed below this session with skilled education provided on AE, AD, and hemi-techniques. Pt would benefit from continued OT in IPR setting. Pt was left resting in bed with call bell in reach, bed alarm on, and all needs met.    PM Session:  Pt received lightly sleeping in bed waking upon OT arrival. Pt presenting to be in good spirits receptive to skilled OT session reporting 4/10 pain in R shoulder- OT offering intermittent rest breaks, repositioning, and therapeutic support to optimize participation in therapy session. Focused this session on pain management, R UE NMRE, and building therpeutic rapport with Pt. Retrieve wc and leg rests for Pt to support OOB tolerance. She completed stand pivot EOB > WC with min HHA. Transported Pt to therapy gym total A in wc for energy conservation and time management. Took patient to tour therapy spaces while providing education on POC and therapy schedule for IPR. Applied cervical heat pack to Pt's shoulders and upper back to decrease pain to improve participation in NMRE exercises. Noted improvement in pain reported at end of session following heat pack application and ROM. Engaged Pt in completing table glides moving through scapular protraction/retraction, scapular circumduction clockwise/counter clockwise, isolated elbow flexion/extension, and shoulder abduction/adduction. Pt with slowed motor movements and min/mod compensatory trunk flexion and shoulder elevation during exercises- improvement with verbal and tactile cues. Pt also noted to have anteriorly tilted scapula- provided education on trunk positioning and scapular positioning with Pt receptive to education. Pt was left resting in wc with call bell in reach, seatbelt alarm on, and all needs met.   ADL ADL Equipment Provided: Reacher Eating: Set up Where Assessed-Eating: Bed level Grooming: Moderate assistance Where Assessed-Grooming: Edge of bed Upper Body Bathing: Moderate assistance Where  Assessed-Upper Body Bathing: Shower Lower Body Bathing: Moderate cueing Where Assessed-Lower Body Bathing: Shower Upper Body Dressing: Moderate assistance Where Assessed-Upper Body Dressing: Edge of bed Lower Body Dressing: Maximal assistance Where Assessed-Lower Body Dressing: Edge of bed Toileting: Maximal assistance;Moderate assistance Where Assessed-Toileting: Teacher, adult education: Curator Method: Proofreader: Engineer, technical sales: Not assessed Film/video editor: Insurance underwriter Method: Designer, industrial/product: Event organiser  Bed Mobility Bed Mobility: Rolling Right;Rolling Left;Supine to Sit;Sit to Supine Rolling Right: Minimal Assistance - Patient > 75% Rolling Left: Supervision/Verbal cueing Supine to Sit: Minimal Assistance - Patient > 75% Sit to Supine: Minimal Assistance - Patient > 75% Transfers Sit to Stand: Minimal Assistance - Patient > 75% Stand to Sit: Minimal Assistance - Patient > 75%   Discharge Criteria: Patient will be discharged from OT if patient refuses treatment 3 consecutive times without medical reason, if treatment goals not met, if there is a change in medical  status, if patient makes no progress towards goals or if patient is discharged from hospital.  The above assessment, treatment plan, treatment alternatives and goals were discussed and mutually agreed upon: by patient  Holly Henry 10/29/2023, 12:16 PM

## 2023-10-29 NOTE — Progress Notes (Signed)
 PROGRESS NOTE   Subjective/Complaints:    Objective:   No results found. Recent Labs    10/29/23 0440  WBC 7.8  HGB 11.6*  HCT 36.1  PLT 245   Recent Labs    10/29/23 0440  NA 137  K 4.1  CL 104  CO2 24  GLUCOSE 112*  BUN 27*  CREATININE 1.08*  CALCIUM  8.8*    Intake/Output Summary (Last 24 hours) at 10/29/2023 0845 Last data filed at 10/29/2023 0800 Gross per 24 hour  Intake 600 ml  Output --  Net 600 ml        Physical Exam: Vital Signs Blood pressure (!) 155/96, pulse 79, temperature 97.9 F (36.6 C), resp. rate 18, height 5' 1 (1.549 m), weight 78.1 kg, SpO2 95%.   General: No acute distress Mood and affect are appropriate Heart: Regular rate and rhythm no rubs murmurs or extra sounds Lungs: Clear to auscultation, breathing unlabored, no rales or wheezes Abdomen: Positive bowel sounds, soft nontender to palpation, nondistended Extremities: No clubbing, cyanosis, or edema Skin: No evidence of breakdown, no evidence of rash Neurologic: Cranial nerves II through XII intact, motor strength is 5/5 in left and 3-/5 right deltoid, bicep, tricep, grip, 5/5 left and 4/5 right hip flexor, knee extensors, ankle dorsiflexor and plantar flexor Sensory exam normal sensation to light touch in bilateral upper and lower extremities Cerebellar exam normal finger to nose to finger Musculoskeletal: MCP PIP and DIP deformities in bilateral hands unable to make fist on left side related to deformities    Assessment/Plan: 1. Functional deficits which require 3+ hours per day of interdisciplinary therapy in a comprehensive inpatient rehab setting. Physiatrist is providing close team supervision and 24 hour management of active medical problems listed below. Physiatrist and rehab team continue to assess barriers to discharge/monitor patient progress toward functional and medical goals  Care Tool:  Bathing               Bathing assist       Upper Body Dressing/Undressing Upper body dressing        Upper body assist      Lower Body Dressing/Undressing Lower body dressing            Lower body assist       Toileting Toileting    Toileting assist       Transfers Chair/bed transfer  Transfers assist           Locomotion Ambulation   Ambulation assist              Walk 10 feet activity   Assist           Walk 50 feet activity   Assist           Walk 150 feet activity   Assist           Walk 10 feet on uneven surface  activity   Assist           Wheelchair     Assist               Wheelchair 50 feet with 2 turns activity    Assist  Wheelchair 150 feet activity     Assist          Blood pressure (!) 155/96, pulse 79, temperature 97.9 F (36.6 C), resp. rate 18, height 5' 1 (1.549 m), weight 78.1 kg, SpO2 95%.  Medical Problem List and Plan: 1. Functional deficits secondary to left frontal, temporoparietal infarction likely due to atrial fibrillation while on OAC likely due to Eliquis  failure             -patient may shower             -ELOS/Goals: 7 to 10 days, mod I to supervision with PT and OT             -Admit to CIR 2.  Antithrombotics: -DVT/anticoagulation:  Pharmaceutical: Other (comment) Pradaxa              -antiplatelet therapy: N/A 3. Pain Management: Oxycodone  5 mg every 4 hours as needed             - Pt reports she was previously discharged from [facility] pain clinic after oxycodone  was not found in UDS 4. Mood/Behavior/Sleep: BuSpar  15 mg twice daily, Cymbalta  90 mg daily, melatonin 5 mg nightly as needed             -antipsychotic agents: N/A 5. Neuropsych/cognition: This patient is capable of making decisions on her own behalf. 6. Skin/Wound Care: Routine skin checks 7. Fluids/Electrolytes/Nutrition: Routine in and outs with follow-up chemistries 8.  Hyperlipidemia.   Lipitor /Zetia  9.  GERD.  Protonix  10. Atrial fibrillation.  Continue Pradaxa .  Cardiac rate controlled. 11.  Prediabetes.  Hemoglobin A1c 6.0.  Currently on a regular diet.  CBGs discontinued 12. Obesity.  Dietary counseling             Body mass index is 31.95 kg/m. 13, Rheumatoid arthritis/Fibromyalgia              -Continue Cymbalta , pain control             -She says was on methotrexate in the past but this was stopped by her rheumatologist about 6 months ago, states she has been on Orencia              -She was on gabapentin  300mg  TID before admission, Restart at lower dose 100mg  TID for now, could consider increasing back up at a later time 14.  Hypertension.  Continue metoprolol. Vitals:   10/28/23 2021 10/29/23 0638  BP: (!) 148/98 (!) 155/96  Pulse: 92 79  Resp: 19 18  Temp: 98.1 F (36.7 C) 97.9 F (36.6 C)  SpO2: 94% 95%    15. Constipation, Continue MiraLAX  daily and sorbitol  as needed             - Patient got sorbitol  today, consider additional medication tomorrow if no BM        LOS: 1 days A FACE TO FACE EVALUATION WAS PERFORMED  Prentice FORBES Compton 10/29/2023, 8:45 AM

## 2023-10-29 NOTE — Evaluation (Signed)
 Physical Therapy Assessment and Plan  Patient Details  Name: Holly Henry MRN: [id number] Date of Birth: 1955/05/03  PT Diagnosis: Difficulty walking, Hemiplegia dominant, and Muscle weakness Rehab Potential: Good ELOS: 10-14 Days   Today's Date: 10/29/2023 PT Individual Time: 1300-1415 PT Individual Time Calculation (min): 75 min    Hospital Problem: Principal Problem:   Ischemic cerebrovascular accident (CVA) (HCC)   Past Medical History:  Past Medical History:  Diagnosis Date   A-fib (HCC)    Fibromyalgia    High cholesterol    Hypertension    Osteoarthritis    Prediabetes    Rheumatoid arthritis (HCC)    Past Surgical History:  Past Surgical History:  Procedure Laterality Date   BACK SURGERY     CATARACT EXTRACTION Bilateral    TONSILLECTOMY      Assessment & Plan Clinical Impression: Patient is a 68 year old right-handed female with history significant for paroxysmal atrial fibrillation on Eliquis , hypertension, hyperlipidemia, rheumatoid arthritis, GERD, IBS, anxiety/depression maintained on BuSpar , prior TIA/CVA, prediabetes. Per chart review patient lives with daughter and grandchildren. Independent prior to admission but did have occasional fall over the last 6 months. To level home, laundry/work area and basement. Patient able to stay on main level. One-step to entry. Presented 10/25/2023 with acute onset of right sided weakness and sensory loss. Blood pressure 150/106 per EMS. Cranial CT scan negative for acute changes. Chronic microvascular ischemic changes. CTA showed no emergent large vessel occlusion. Patient did not receive TNK. MRI of the brain showed acute nonhemorrhagic infarct involving the left precentral gyrus. Small lacunar white matter infarct involving the left temporal occipital junction and the left parietal lobe. Admission chemistries unremarkable, hemoglobin A1c 6.0. Echocardiogram with ejection fraction of 65 to 70% no wall motion abnormalities grade 1  diastolic dysfunction. Neurology follow-up felt left frontal, temporoparietal infarct secondary to atrial fibrillation while on OAC likely Eliquis  failure transition to Pradaxa . Tolerating a regular consistency diet. Patient reports she was previously on methotrexate for rheumatoid arthritis but this was discontinued due to possible side effects about 6 months ago.  Patient transferred to CIR on 10/28/2023 .   Patient currently requires min with mobility secondary to muscle weakness, decreased cardiorespiratoy endurance, decreased motor planning, and decreased sitting balance, decreased standing balance, decreased postural control, hemiplegia, and decreased balance strategies.  Prior to hospitalization, patient was independent  with mobility and lived with Family in a House home.  Home access is 1Stairs to enter.  Patient will benefit from skilled PT intervention to maximize safe functional mobility, minimize fall risk, and decrease caregiver burden for planned discharge home with 24 hour supervision.  Anticipate patient will benefit from follow up OP at discharge.  PT - End of Session Endurance Deficit: Yes   PT Evaluation Precautions/Restrictions Precautions Precautions: Fall;Other (comment) Recall of Precautions/Restrictions: Intact Precaution/Restrictions Comments: watch bp Restrictions Weight Bearing Restrictions Per Provider Order: No General Chart Reviewed: Yes Family/Caregiver Present: No  Pain Interference Pain Interference Pain Effect on Sleep: 3. Frequently Pain Interference with Therapy Activities: 1. Rarely or not at all Pain Interference with Day-to-Day Activities: 4. Almost constantly Home Living/Prior Functioning Home Living Living Arrangements: Children;Other relatives Available Help at Discharge: Family;Available 24 hours/day Type of Home: House Home Access: Stairs to enter [employer] of Steps: 1 Entrance Stairs-Rails: None Home Layout: Two level;Laundry  or work area in basement;Able to live on main level with bedroom/bathroom Alternate Teacher, music of Steps: flight Alternate Level Stairs-Rails: Right Bathroom Shower/Tub: Armed forces operational officer Accessibility: Yes  Additional Comments: May have a w/c available  Lives With: Family Prior Function Level of Independence: Independent with basic ADLs;Independent with transfers;Independent with gait  Able to Take Stairs?: Yes Driving: Yes Vocation: Retired Vision/Perception  Vision - History Ability to See in Adequate Light: 0 Adequate Perception Perception: Within Functional Limits (will continue to assess) Praxis Praxis: Impaired Praxis Impairment Details: Motor planning  Cognition Overall Cognitive Status: Within Functional Limits for tasks assessed Arousal/Alertness: Awake/alert Attention: Sustained Sustained Attention: Appears intact Memory: Appears intact Awareness: Appears intact Problem Solving: Appears intact Safety/Judgment: Impaired Sensation Sensation Light Touch: Appears Intact (will continue to assess) Hot/Cold: Appears Intact Proprioception: Appears Intact Stereognosis: Not tested Coordination Gross Motor Movements are Fluid and Coordinated: No Fine Motor Movements are Fluid and Coordinated: No Coordination and Movement Description: decreased balance and coordination of R LE Finger Nose Finger Test: unable to compelte on R, smooth equal movemetns on L UE Motor  Motor Motor: Hemiplegia Motor - Skilled Clinical Observations: R U>LE hemiplegia  Trunk/Postural Assessment  Cervical Assessment Cervical Assessment: Exceptions to [facility] (forward head) Thoracic Assessment Thoracic Assessment: Exceptions to [facility] (rounded shoulders with kyphotic posture) Lumbar Assessment Lumbar Assessment: Exceptions to [facility] (posterior pelvic tilt) Postural Control Postural Control: Deficits on evaluation Trunk Control: decreased Righting Reactions:  delayed Protective Responses: delayed  Balance Standardized Balance Assessment Standardized Balance Assessment: Berg Balance Test Berg Balance Test Sit to Stand: Able to stand  independently using hands Standing Unsupported: Able to stand 30 seconds unsupported Sitting with Back Unsupported but Feet Supported on Floor or Stool: Able to sit safely and securely 2 minutes Stand to Sit: Controls descent by using hands Transfers: Able to transfer with verbal cueing and /or supervision Standing Unsupported with Eyes Closed: Able to stand 10 seconds with supervision Standing Ubsupported with Feet Together: Able to place feet together independently but unable to hold for 30 seconds From Standing, Reach Forward with Outstretched Arm: Reaches forward but needs supervision From Standing Position, Pick up Object from Floor: Able to pick up shoe, needs supervision From Standing Position, Turn to Look Behind Over each Shoulder: Turn sideways only but maintains balance Turn 360 Degrees: Needs close supervision or verbal cueing Standing Unsupported, Alternately Place Feet on Step/Stool: Able to stand independently and complete 8 steps >20 seconds Standing Unsupported, One Foot in Front: Able to plae foot ahead of the other independently and hold 30 seconds Standing on One Leg: Tries to lift leg/unable to hold 3 seconds but remains standing independently Total Score: 33 Static Sitting Balance Static Sitting - Balance Support: Feet unsupported Static Sitting - Level of Assistance: 5: Stand by assistance Dynamic Sitting Balance Dynamic Sitting - Balance Support: During functional activity Dynamic Sitting - Level of Assistance: 5: Stand by assistance;4: Min assist Static Standing Balance Static Standing - Balance Support: During functional activity;Right upper extremity supported Static Standing - Level of Assistance: 4: Min assist Dynamic Standing Balance Dynamic Standing - Balance Support: During  functional activity;Right upper extremity supported Dynamic Standing - Level of Assistance: 4: Min assist Extremity Assessment  RUE Assessment RUE Assessment: Exceptions to [facility] Passive Range of Motion (PROM) Comments: pain at shoulder during shoulder flexion/abduction so only assessed to 90* General Strength Comments: 2-/5 deltoid, 3-/5 biceps, 2+/5 triceps, 2/5 grip LUE Assessment LUE Assessment: Within Functional Limits General Strength Comments: 4/5 d/t general deconditioning RLE Assessment RLE Assessment: Exceptions to [facility] General Strength Comments: Grossly 4/5 LLE Assessment LLE Assessment: Within Functional Limits  Care Tool   10/29/23 1359  CareTool - Bed  Mobility  Roll left and right assist level Minimal Assistance - Patient > 75%  Sit to lying assist level Minimal Assistance - Patient > 75%  Lying to sitting on side of bed assist level: the ability to move from lying on the back to sitting on the side of the bed with no back support. Minimal Assistance - Patient > 75%  CareTool - Sit to stand transfer  Sit to stand assist level Minimal Assistance - Patient > 75%  CareTool - Chair/bed transfer  Chair/bed transfer assist level Minimal Assistance - Patient > 75%  CareTool - Toilet Transfers  Assist Level Minimal Assistance - Patient > 75%  CareTool Insurance claims handler transfer assist level Minimal Assistance - Patient > 75%  CareTool - Locomotion: Ambulation  Assist level Minimal Assistance - Patient > 75%  Assistive device No Device  Max distance 120'  Assist level Minimal Assistance - Patient > 75%  Assistive device No Device  Assist level Minimal Assistance - Patient > 75%  Assistive device No Device  Walk 150 feet activity did not occur Safety/medical concerns  Assist level Minimal Assistance - Patient > 75%  CareTool - Locomotion: Stairs  Assist level Minimal Assistance - Patient > 75%  Stairs assistive device 2 hand rails  Max number of stairs 12  Walk up/down  1 step (curb) assist level Minimal Assistance - Patient > 75%  Walk up/down 1 step or curb assistive device 2 hand rails  Walk up/down 4 steps assist level Minimal Assistance - Patient > 75%  Walk up/down 4 steps assistive device 2 hand rails  Walk up/down 12 steps assist level Minimal Assistance - Patient > 75%  Walk up/down 12 steps assistive device 2 hand rails  Pick up small object from the floor assist level Minimal Assistance - Patient > 75%  CareTool - Locomotion: Wheelchair  Is the patient using a wheelchair? Yes  Type of Wheelchair Manual  Wheelchair assist level Dependent - Patient 0%  Max wheelchair distance 150'  Assist Level Dependent - Patient 0%  Assist Level Dependent - Patient 0%      Refer to Care Plan for Long Term Goals  SHORT TERM GOAL WEEK 1 PT Short Term Goal 1 (Week 1): Pt willl complete bed mobility with CGA PT Short Term Goal 2 (Week 1): Pt will complete bed to chair with CGA PT Short Term Goal 3 (Week 1): Pt will ambulate x150' with CGA and LRAD PT Short Term Goal 4 (Week 1): Pt will improve Berg by MCID  Recommendations for other services: Therapeutic Recreation  Other Dance  Skilled Therapeutic Intervention  Evaluation completed (see details above and below) with education on PT POC and goals and individual treatment initiated with focus on balance, transfers, ambulation, car transfers, and stair training. Pt received seated in WC and agrees to therapy. Reports chronic pain from RA, OA, and fibromyalgia. Number not provided. PT provides rest breaks as needed to manage pain. WC transport to gym. Pt completes ramp navigation and car transfer with minA and no AD, with cues for sequencing and safety. Following rest break, pt ambulates x120' with minA and no AD, with cues for upright posture to improve balance, as well as increasing stride length and gait speed to decrease risk for falls. Pt completes x12 6 steps with bilateral hand rails and minA, with cues for  hand placement and step sequencing.   Pt completes Nustep for endurance training and gentle ROM of joints for pain relief. Pt  completes x12:00 at workload of 4 with average steps per minute ~35. PT provides cues for hand and foot placement and completing full available ROM.   Pt completes Berg balance test, as detailed above. WC transport back to room. Stand step back to bed with minA Left supine with all needs within reach.    Mobility Bed Mobility Bed Mobility: Rolling Right;Rolling Left;Supine to Sit;Sit to Supine Rolling Right: Minimal Assistance - Patient > 75% Rolling Left: Supervision/Verbal cueing Supine to Sit: Minimal Assistance - Patient > 75% Sit to Supine: Minimal Assistance - Patient > 75% Transfers Transfers: Sit to Stand;Stand to Sit;Stand Pivot Transfers Sit to Stand: Minimal Assistance - Patient > 75% Stand to Sit: Minimal Assistance - Patient > 75% Stand Pivot Transfers: Minimal Assistance - Patient > 75% Stand Pivot Transfer Details: Verbal cues for sequencing;Verbal cues for gait pattern;Verbal cues for technique Transfer (Assistive device): 1 person hand held assist Locomotion  Gait Ambulation: Yes Gait Assistance: Minimal Assistance - Patient > 75% Gait Distance (Feet): 120 Feet Assistive device: None Gait Assistance Details: Verbal cues for gait pattern;Verbal cues for technique;Tactile cues for weight shifting;Tactile cues for posture Gait Gait: Yes Gait Pattern: Impaired Gait Pattern: Decreased step length - right;Decreased trunk rotation Stairs / Additional Locomotion Stairs: Yes Stairs Assistance: Minimal Assistance - Patient > 75% Stair Management Technique: Two rails Number of Stairs: 12 Height of Stairs: 6 Ramp: Minimal Assistance - Patient >75% Curb: Minimal Assistance - Patient >75% Wheelchair Mobility Wheelchair Mobility: No   Discharge Criteria: Patient will be discharged from PT if patient refuses treatment 3 consecutive times without  medical reason, if treatment goals not met, if there is a change in medical status, if patient makes no progress towards goals or if patient is discharged from hospital.  The above assessment, treatment plan, treatment alternatives and goals were discussed and mutually agreed upon: by patient  Elsie JAYSON Dawn, PT, DPT 10/29/2023, 3:57 PM

## 2023-10-30 DIAGNOSIS — I482 Chronic atrial fibrillation, unspecified: Secondary | ICD-10-CM | POA: Diagnosis not present

## 2023-10-30 DIAGNOSIS — I639 Cerebral infarction, unspecified: Secondary | ICD-10-CM | POA: Diagnosis not present

## 2023-10-30 DIAGNOSIS — M069 Rheumatoid arthritis, unspecified: Secondary | ICD-10-CM | POA: Diagnosis not present

## 2023-10-30 DIAGNOSIS — G8191 Hemiplegia, unspecified affecting right dominant side: Secondary | ICD-10-CM | POA: Diagnosis not present

## 2023-10-30 MED ORDER — TIZANIDINE HCL 4 MG PO TABS
4.0000 mg | ORAL_TABLET | Freq: Every day | ORAL | Status: DC
  Administered 2023-10-30 – 2023-11-10 (×12): 4 mg via ORAL
  Filled 2023-10-30 (×12): qty 1

## 2023-10-30 MED ORDER — NYSTATIN 100000 UNIT/GM EX POWD
Freq: Three times a day (TID) | CUTANEOUS | Status: DC
  Filled 2023-10-30: qty 15

## 2023-10-30 MED ORDER — TIZANIDINE HCL 4 MG PO TABS: 2.0000 mg | ORAL_TABLET | Freq: Three times a day (TID) | ORAL | Status: DC | PRN

## 2023-10-30 MED ORDER — TIZANIDINE HCL 4 MG PO TABS: 4.0000 mg | ORAL_TABLET | Freq: Every day | ORAL | Status: DC

## 2023-10-30 MED ORDER — TIZANIDINE HCL 4 MG PO TABS
2.0000 mg | ORAL_TABLET | Freq: Three times a day (TID) | ORAL | Status: DC | PRN
  Administered 2023-11-04 – 2023-11-11 (×5): 2 mg via ORAL
  Filled 2023-10-30 (×7): qty 1

## 2023-10-30 MED ORDER — TRIAMCINOLONE ACETONIDE 0.1 % EX CREA
TOPICAL_CREAM | Freq: Every day | CUTANEOUS | Status: DC
  Filled 2023-10-30: qty 15

## 2023-10-30 NOTE — Progress Notes (Signed)
 Physical Therapy Session Note  Patient Details  Name: Holly Henry MRN: [id number] Date of Birth: 06/29/1955  Today's Date: 10/30/2023 PT Individual Time: 1300-1415 PT Individual Time Calculation (min): 75 min   Short Term Goals: Week 1:  PT Short Term Goal 1 (Week 1): Pt willl complete bed mobility with CGA PT Short Term Goal 2 (Week 1): Pt will complete bed to chair with CGA PT Short Term Goal 3 (Week 1): Pt will ambulate x150' with CGA and LRAD PT Short Term Goal 4 (Week 1): Pt will improve Berg by MCID  Skilled Therapeutic Interventions/Progress Updates:    Pt seated in w/c on arrival and agreeable to therapy. Reports 6/10 shoulder pain, and up to 8/10 knee pain with activity, pt reports this pain is chronic, rest and positioning as needed. Pt transported to therapy gym for time management and energy conservation.   Session focused on gait and LE strength. Pt performed bouts of x 5 Sit to stand and gait x 170 ft for endurance and HIGT. HR found to be 106-118 on checks during rest breaks. Gait with RW and CGA overall. Cues for upright posture and therapist adjusted RW to more appropriate height, pt might benefit from youth RW.  Session focused on high intensity gait training with goal of maintaining elevated HR 70-80% of age predicted HR max.  Age predicted max ( 220-age)=139 Goal range=97-111 bpm  Pt then used nustep on rolling hills profile 2  x 7  min with 2 min rest break, load interval 6-10, acg 24 spm. Pt reports feeling it most in BUE vs LE. Discussed normal muscle soreness and acceptable levels of discomfort associated with physical activity.   Pt returned to room and to bed with supervision, was left with all needs in reach and alarm active.    Therapy Documentation Precautions:  Precautions Precautions: Fall, Other (comment) Recall of Precautions/Restrictions: Intact Precaution/Restrictions Comments: watch bp Restrictions Weight Bearing Restrictions Per Provider Order:  No General:      Therapy/Group: Individual Therapy  [name] 10/30/2023, 1:22 PM

## 2023-10-30 NOTE — Progress Notes (Signed)
 PROGRESS NOTE   Subjective/Complaints:  No issues overnight , daughter on phone listening with pt's consent  Occ ear pain but no reported loss of hearing Chronic HA but more prior to and following CVA  ROS- neg CP, SOB,  N/V/D Objective:   No results found. Recent Labs    10/29/23 0440  WBC 7.8  HGB 11.6*  HCT 36.1  PLT 245   Recent Labs    10/29/23 0440  NA 137  K 4.1  CL 104  CO2 24  GLUCOSE 112*  BUN 27*  CREATININE 1.08*  CALCIUM  8.8*    Intake/Output Summary (Last 24 hours) at 10/30/2023 0835 Last data filed at 10/30/2023 0700 Gross per 24 hour  Intake 720 ml  Output --  Net 720 ml        Physical Exam: Vital Signs Blood pressure (!) 161/90, pulse 88, temperature 98.4 F (36.9 C), resp. rate 19, height 5' 1 (1.549 m), weight 78.1 kg, SpO2 96%.   General: No acute distress Mood and affect are appropriate Heart: Regular rate and rhythm no rubs murmurs or extra sounds Lungs: Clear to auscultation, breathing unlabored, no rales or wheezes Abdomen: Positive bowel sounds, soft nontender to palpation, nondistended Extremities: No clubbing, cyanosis, or edema Skin: No evidence of breakdown, no evidence of rash Neurologic: Cranial nerves II through XII intact, motor strength is 5/5 in left and 3-/5 right deltoid, bicep, tricep, grip, 5/5 left and 4/5 right hip flexor, knee extensors, ankle dorsiflexor and plantar flexor Sensory exam normal sensation to light touch in bilateral upper and lower extremities Cerebellar exam normal finger to nose to finger Musculoskeletal: MCP PIP and DIP deformities in bilateral hands unable to make fist on left side related to deformities    Assessment/Plan: 1. Functional deficits which require 3+ hours per day of interdisciplinary therapy in a comprehensive inpatient rehab setting. Physiatrist is providing close team supervision and 24 hour management of active medical  problems listed below. Physiatrist and rehab team continue to assess barriers to discharge/monitor patient progress toward functional and medical goals  Care Tool:  Bathing    Body parts bathed by patient: Right arm, Chest, Abdomen, Front perineal area, Right upper leg, Left upper leg   Body parts bathed by helper: Left arm, Buttocks, Right lower leg, Left lower leg     Bathing assist Assist Level: Moderate Assistance - Patient 50 - 74%     Upper Body Dressing/Undressing Upper body dressing   What is the patient wearing?: Pull over shirt    Upper body assist Assist Level: Moderate Assistance - Patient 50 - 74%    Lower Body Dressing/Undressing Lower body dressing      What is the patient wearing?: Underwear/pull up, Pants     Lower body assist Assist for lower body dressing: Maximal Assistance - Patient 25 - 49%     Toileting Toileting    Toileting assist Assist for toileting: Maximal Assistance - Patient 25 - 49%     Transfers Chair/bed transfer  Transfers assist     Chair/bed transfer assist level: Minimal Assistance - Patient > 75%     Locomotion Ambulation   Ambulation assist  Assist level: Minimal Assistance - Patient > 75% Assistive device: No Device Max distance: 120'   Walk 10 feet activity   Assist     Assist level: Minimal Assistance - Patient > 75% Assistive device: No Device   Walk 50 feet activity   Assist    Assist level: Minimal Assistance - Patient > 75% Assistive device: No Device    Walk 150 feet activity   Assist Walk 150 feet activity did not occur: Safety/medical concerns         Walk 10 feet on uneven surface  activity   Assist     Assist level: Minimal Assistance - Patient > 75%     Wheelchair     Assist Is the patient using a wheelchair?: Yes Type of Wheelchair: Manual    Wheelchair assist level: Dependent - Patient 0% Max wheelchair distance: 150'    Wheelchair 50 feet with 2 turns  activity    Assist        Assist Level: Dependent - Patient 0%   Wheelchair 150 feet activity     Assist      Assist Level: Dependent - Patient 0%   Blood pressure (!) 161/90, pulse 88, temperature 98.4 F (36.9 C), resp. rate 19, height 5' 1 (1.549 m), weight 78.1 kg, SpO2 96%.  Medical Problem List and Plan: 1. 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  Antithrombotics: -DVT/anticoagulation:  Pharmaceutical: Other (comment) Pradaxa              -antiplatelet therapy: N/A 3. Pain Management: Oxycodone  5 mg every 4 hours as needed             - Pt reports she was previously discharged from [facility] pain clinic after oxycodone  was not found in UDS 4. Mood/Behavior/Sleep: BuSpar  15 mg twice daily, Cymbalta  90 mg daily, melatonin 5 mg nightly as needed             -antipsychotic agents: N/A 5. Neuropsych/cognition: This patient is capable of making decisions on her own behalf. 6. Skin/Wound Care: Routine skin checks 7. Fluids/Electrolytes/Nutrition: Routine in and outs with follow-up chemistries 8.  Hyperlipidemia.  Lipitor /Zetia  9.  GERD.  Protonix  10. Atrial fibrillation.  Continue Pradaxa .  Cardiac rate controlled. 11.  Prediabetes.  Hemoglobin A1c 6.0.  Currently on a regular diet.  CBGs discontinued 12. Obesity.  Dietary counseling             Body mass index is 31.95 kg/m. 13, Rheumatoid arthritis/Fibromyalgia              -Continue Cymbalta , pain control             -She says was on methotrexate in the past but this was stopped by her rheumatologist about 6 months ago, states she has been on Orencia              -She was on gabapentin  300mg  TID before admission, Restart at lower dose 100mg  TID for now, could  consider increasing back up at a later time 14.  Hypertension.  Continue metoprolol. Vitals:   10/29/23 2108  10/30/23 0538  BP: (!) 145/75 (!) 161/90  Pulse: 89 88  Resp: 18 19  Temp: 97.7 F (36.5 C) 98.4 F (36.9 C)  SpO2: 96% 96%    15. Constipation, Continue MiraLAX  daily and sorbitol  as needed             - Patient got sorbitol  today, consider additional medication tomorrow if no BM        LOS: 2 days A FACE TO FACE EVALUATION WAS PERFORMED  Prentice FORBES Compton 10/30/2023, 8:35 AM

## 2023-10-30 NOTE — Progress Notes (Signed)
 Occupational Therapy Session Note  Patient Details  Name: Holly Henry MRN: [id number] Date of Birth: 07/17/1955  Today's Date: 10/30/2023 OT Individual Time: 1102-1202 OT Individual Time Calculation (min): 60 min    Short Term Goals: Week 1:  OT Short Term Goal 1 (Week 1): Pt will recall hemidressing techniques with min questioning cues OT Short Term Goal 2 (Week 1): Pt will maintain R UE in safe position during functional transfers, ADLs, and when sitting in wc with min verbal cues OT Short Term Goal 3 (Week 1): Pt will complete LB dresing with min A using AE and LRAD PRN OT Short Term Goal 4 (Week 1): Pt will complete toilet transfers with CGA using LRAD  Skilled Therapeutic Interventions/Progress Updates:     Pt received semi-reclined in bed presenting to be in good spirits receptive to skilled OT session reporting 6/10 pain in her shoulders- OT offering intermittent rest breaks, repositioning, and therapeutic support to optimize participation in therapy session. Provided Pt with cervical heat pack during session positioned over B shoulders and upper portion of back with pain decreasing to 3-4/10. Focused this session on ADL retraining, R hemi-body NMRE, activity tolerance, and pain management.   Pt requesting to use restroom at beginning of session. She completed functional mobility to bathroom using RW with light min A provided for balance and RW management- verbal cues provided to maintain R hand on RW and for RW positioning. (Donned co-band to R hand of RW to support improved proprioceptive feedback and attention to R hand position on RW position with noted improvement). Pt completed 3/3 toileting tasks with min A this session with assistance required to fully bring pants over waist in standing. She was able to complete peri-care with R hand in seated position and then utilize L UE to fully clean and ensure cleanliness. Following toileting, she stood at sink to wash hands with CGA provided  for standing balance.  Transported Pt total A to therapy gym in wc for energy conservation and time management.   While standing at elevated mat table in modified plantigrade position, engaged Pt in completing body on arm movements working through shoulder flexion/extension for improved activation in pain free ROM and facilitating WB'ing through R UE for NMRE. Pt then completed table glides with R UE positioned on wash cloth with emphasis on scapular protraction/retraction. Improved scapular retraction noted this session with tactile and verbal cues.   While standing at table, Pt completed dynamic balance functional reaching activity to work on targeted reach, decreasing compensatory movement patterns, and improving overall R UE functional use. Task Pt with retrieving textured cones from R side of table and transporting them to opposite side of table- worked on leading with hand to improve supination and isolated grasp/release. Pt noted to have improved digit extension on R 1st and 2nd digits with increased challenges fully extending 3rd-5th digits. Min compensatory shoulder elevation noted- improvement with verbal and tactile cues.    Issued Pt a pink foam cube to work on grasp strength and pinch between therapy session. Also issued Pt red foam tubing for utensils to create built-up handles- planing to follow up with Pt during next session.   Pt was left resting in wc with call bell in reach, seatbelt alarm on, and all needs met.    Therapy Documentation Precautions:  Precautions Precautions: Fall, Other (comment) Recall of Precautions/Restrictions: Intact Precaution/Restrictions Comments: watch bp Restrictions Weight Bearing Restrictions Per Provider Order: No   Therapy/Group: Individual Therapy  Holly Henry 10/30/2023,  8:01 AM

## 2023-10-30 NOTE — Evaluation (Signed)
 Speech Language Pathology Assessment and Plan  Patient Details  Name: Holly Henry MRN: [id number] Date of Birth: 1955-07-28   Today's Date: 10/30/2023 SLP Individual Time: 0900-0956 SLP Individual Time Calculation (min): 56 min   Hospital Problem: Principal Problem:   Ischemic cerebrovascular accident (CVA) [facility])  Past Medical History:  Past Medical History:  Diagnosis Date   A-fib (HCC)    Fibromyalgia    High cholesterol    Hypertension    Osteoarthritis    Prediabetes    Rheumatoid arthritis (HCC)    Past Surgical History:  Past Surgical History:  Procedure Laterality Date   BACK SURGERY     CATARACT EXTRACTION Bilateral    TONSILLECTOMY      Assessment / Plan / Recommendation Clinical Impression HPI: 68 yo female with history for paroxysmal a fib on ELiquis , HTN, HLD, RA and fibromyalgia, GERD, IBS, chronic anxiety/depression, prior CVA, prediabetes who presented on 10/26/23 with sudden onset of right arm and leg weakness.  Blood pressure 150/106 per EMS. Cranial CT scan negative for acute changes. Chronic microvascular ischemic changes. CTA showed no emergent large vessel occlusion. Patient did not receive TNK. MRI of the brain showed acute nonhemorrhagic infarct involving the left precentral gyrus. Small lacunar white matter infarct involving the left temporal occipital junction and the left parietal lobe. Admission chemistries unremarkable, hemoglobin A1c 6.0. Echocardiogram with ejection fraction of 65 to 70% no wall motion abnormalities grade 1 diastolic dysfunction. Neurology follow-up felt left frontal, temporoparietal infarct secondary to atrial fibrillation while on OAC likely Eliquis  failure transition to Pradaxa . Tolerating a regular consistency diet. Therapy evaluations completed due to patient decreased functional ability right side weakness was admitted for a comprehensive rehab program.   Clinical Impression:  Patient was evaluated via the RBANS to assess  cognitive linguistic functioning. Patient scored WFL on all subtests including immediate memory, visual spatial skills, language, attention and delayed memory. Patient with an overall score of 109 (100 is considered normal with a standard deviation of 15). Patient reports no cognitive changes since CVA. Patients expressive/receptive language is [facility] and patient is 100% intelligible at the conversational level. No ST services warranted during inpatient rehabilitation stay.     Skilled Therapeutic Interventions          Patient evaluated using a standardized cognitive linguistic assessment and bedside swallow evaluation to assess current cognitive, communicative and swallowing function. See above for details.    SLP Assessment  Patient does not need any further Speech Lanaguage Pathology Services    Recommendations  Patient destination: Home Follow up Recommendations: None Equipment Recommended: None recommended by SLP     Pain None reported   SLP Evaluation Cognition Overall Cognitive Status: Within Functional Limits for tasks assessed Arousal/Alertness: Awake/alert Orientation Level: Oriented X4 Sustained Attention: Appears intact Memory: Appears intact Awareness: Appears intact Problem Solving: Appears intact  Comprehension Auditory Comprehension Overall Auditory Comprehension: Appears within functional limits for tasks assessed Expression Expression Primary Mode of Expression: Verbal Verbal Expression Overall Verbal Expression: Appears within functional limits for tasks assessed Oral Motor Oral Motor/Sensory Function Overall Oral Motor/Sensory Function: Within functional limits Motor Speech Overall Motor Speech: Appears within functional limits for tasks assessed  Care Tool Care Tool Cognition Ability to hear (with hearing aid or hearing appliances if normally used Ability to hear (with hearing aid or hearing appliances if normally used): 1. Minimal difficulty - difficulty  in some environments (e.g. when person speaks softly or setting is noisy)   Expression of Ideas and Wants Expression  of Ideas and Wants: 4. Without difficulty (complex and basic) - expresses complex messages without difficulty and with speech that is clear and easy to understand   Understanding Verbal and Non-Verbal Content Understanding Verbal and Non-Verbal Content: 4. Understands (complex and basic) - clear comprehension without cues or repetitions  Memory/Recall Ability Memory/Recall Ability : Current season;Location of own room;Staff names and faces;That he or she is in a hospital/hospital unit    Refer to Care Plan for Long Term Goals  Recommendations for other services: None   Discharge Criteria: Patient will be discharged from SLP if patient refuses treatment 3 consecutive times without medical reason, if treatment goals not met, if there is a change in medical status, if patient makes no progress towards goals or if patient is discharged from hospital.  The above assessment, treatment plan, treatment alternatives and goals were discussed and mutually agreed upon: by patient  Regino Fournet M.A., CCC-SLP 10/30/2023, 9:59 AM

## 2023-10-31 DIAGNOSIS — M069 Rheumatoid arthritis, unspecified: Secondary | ICD-10-CM | POA: Diagnosis not present

## 2023-10-31 DIAGNOSIS — I639 Cerebral infarction, unspecified: Secondary | ICD-10-CM | POA: Diagnosis not present

## 2023-10-31 DIAGNOSIS — I482 Chronic atrial fibrillation, unspecified: Secondary | ICD-10-CM | POA: Diagnosis not present

## 2023-10-31 DIAGNOSIS — G8191 Hemiplegia, unspecified affecting right dominant side: Secondary | ICD-10-CM | POA: Diagnosis not present

## 2023-10-31 MED ORDER — TRAMADOL HCL 50 MG PO TABS
50.0000 mg | ORAL_TABLET | Freq: Four times a day (QID) | ORAL | Status: DC | PRN
  Administered 2023-10-31 – 2023-11-09 (×8): 50 mg via ORAL
  Filled 2023-10-31 (×8): qty 1

## 2023-10-31 MED ORDER — OXYCODONE HCL 5 MG PO TABS
5.0000 mg | ORAL_TABLET | Freq: Four times a day (QID) | ORAL | Status: DC | PRN
  Administered 2023-11-01 – 2023-11-03 (×5): 5 mg via ORAL
  Filled 2023-10-31 (×5): qty 1

## 2023-10-31 MED ORDER — OXYMETAZOLINE HCL 0.05 % NA SOLN
1.0000 | Freq: Two times a day (BID) | NASAL | Status: AC
  Administered 2023-10-31 – 2023-11-02 (×5): 1 via NASAL
  Filled 2023-10-31: qty 30

## 2023-10-31 MED ORDER — LATANOPROST 0.005 % OP SOLN
1.0000 [drp] | Freq: Every day | OPHTHALMIC | Status: DC
  Administered 2023-10-31 – 2023-11-10 (×11): 1 [drp] via OPHTHALMIC
  Filled 2023-10-31: qty 2.5

## 2023-10-31 NOTE — Progress Notes (Signed)
 PROGRESS NOTE   Subjective/Complaints:  Used Lumigan eye drops at home , pharmacy substitution is latanoprost   ROS- neg CP, SOB,  N/V/D Objective:   No results found. Recent Labs    10/29/23 0440  WBC 7.8  HGB 11.6*  HCT 36.1  PLT 245   Recent Labs    10/29/23 0440  NA 137  K 4.1  CL 104  CO2 24  GLUCOSE 112*  BUN 27*  CREATININE 1.08*  CALCIUM  8.8*    Intake/Output Summary (Last 24 hours) at 10/31/2023 0945 Last data filed at 10/30/2023 1500 Gross per 24 hour  Intake 240 ml  Output --  Net 240 ml        Physical Exam: Vital Signs Blood pressure (!) 144/83, pulse 95, temperature 98.2 F (36.8 C), temperature source Oral, resp. rate 17, height 5' 1 (1.549 m), weight 78.1 kg, SpO2 95%.   General: No acute distress Mood and affect are appropriate Heart: Regular rate and rhythm no rubs murmurs or extra sounds Lungs: Clear to auscultation, breathing unlabored, no rales or wheezes Abdomen: Positive bowel sounds, soft nontender to palpation, nondistended Extremities: No clubbing, cyanosis, or edema Skin: No evidence of breakdown, no evidence of rash Neurologic: Cranial nerves II through XII intact, motor strength is 5/5 in left and 3-/5 right deltoid, bicep, tricep, grip, 5/5 left and 4/5 right hip flexor, knee extensors, ankle dorsiflexor and plantar flexor Sensory exam normal sensation to light touch in bilateral upper and lower extremities Cerebellar exam normal finger to nose to finger Musculoskeletal: MCP PIP and DIP deformities in bilateral hands unable to make fist on left side related to deformities    Assessment/Plan: 1. Functional deficits which require 3+ hours per day of interdisciplinary therapy in a comprehensive inpatient rehab setting. Physiatrist is providing close team supervision and 24 hour management of active medical problems listed below. Physiatrist and rehab team continue to assess  barriers to discharge/monitor patient progress toward functional and medical goals  Care Tool:  Bathing    Body parts bathed by patient: Right arm, Chest, Abdomen, Front perineal area, Right upper leg, Left upper leg   Body parts bathed by helper: Left arm, Buttocks, Right lower leg, Left lower leg     Bathing assist Assist Level: Moderate Assistance - Patient 50 - 74%     Upper Body Dressing/Undressing Upper body dressing   What is the patient wearing?: Pull over shirt    Upper body assist Assist Level: Moderate Assistance - Patient 50 - 74%    Lower Body Dressing/Undressing Lower body dressing      What is the patient wearing?: Underwear/pull up, Pants     Lower body assist Assist for lower body dressing: Maximal Assistance - Patient 25 - 49%     Toileting Toileting    Toileting assist Assist for toileting: Maximal Assistance - Patient 25 - 49%     Transfers Chair/bed transfer  Transfers assist     Chair/bed transfer assist level: Minimal Assistance - Patient > 75%     Locomotion Ambulation   Ambulation assist      Assist level: Minimal Assistance - Patient > 75% Assistive device: No Device Max distance:  120'   Walk 10 feet activity   Assist     Assist level: Minimal Assistance - Patient > 75% Assistive device: No Device   Walk 50 feet activity   Assist    Assist level: Minimal Assistance - Patient > 75% Assistive device: No Device    Walk 150 feet activity   Assist Walk 150 feet activity did not occur: Safety/medical concerns         Walk 10 feet on uneven surface  activity   Assist     Assist level: Minimal Assistance - Patient > 75%     Wheelchair     Assist Is the patient using a wheelchair?: Yes Type of Wheelchair: Manual    Wheelchair assist level: Dependent - Patient 0% Max wheelchair distance: 150'    Wheelchair 50 feet with 2 turns activity    Assist        Assist Level: Dependent - Patient  0%   Wheelchair 150 feet activity     Assist      Assist Level: Dependent - Patient 0%   Blood pressure (!) 144/83, pulse 95, temperature 98.2 F (36.8 C), temperature source Oral, resp. rate 17, height 5' 1 (1.549 m), weight 78.1 kg, SpO2 95%.  Medical Problem List and Plan: 1. 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  Antithrombotics: -DVT/anticoagulation:  Pharmaceutical: Other (comment) Pradaxa              -antiplatelet therapy: N/A 3. Pain Management: Oxycodone  5 mg every 4 hours as needed,  tramadol  for moderate pain              - Pt reports she was previously discharged from [facility] pain clinic after oxycodone  was not found in UDS Change oxy IR to q 6h pain >6/10- plan to wean off prior to d/c Use tramadol  50mg  q6h prn pain >3, <7 4. Mood/Behavior/Sleep: BuSpar  15 mg twice daily, Cymbalta  90 mg daily, melatonin 5 mg nightly as needed             -antipsychotic agents: N/A 5. Neuropsych/cognition: This patient is capable of making decisions on her own behalf. 6. Skin/Wound Care: Routine skin checks 7. Fluids/Electrolytes/Nutrition: Routine in and outs with follow-up chemistries 8.  Hyperlipidemia.  Lipitor /Zetia  9.  GERD.  Protonix  10. Atrial fibrillation.  Continue Pradaxa .  Cardiac rate controlled. 11.  Prediabetes.  Hemoglobin A1c 6.0.  Currently on a regular diet.  CBGs discontinued 12. Obesity.  Dietary counseling             Body mass index is 31.95 kg/m. 13, Rheumatoid arthritis/Fibromyalgia              -Continue Cymbalta , pain control             -She says was on methotrexate in the past but this was stopped by her rheumatologist about 6 months ago, states she has been on Orencia              -She was on  gabapentin  300mg  TID before admission, Restart at lower dose 100mg  TID for now, could consider  increasing back up at a later time 14.  Hypertension.  Continue metoprolol . Vitals:   10/30/23 2122 10/31/23 0501  BP: (!) 148/87 (!) 144/83  Pulse: 95   Resp:  17  Temp:  98.2 F (36.8 C)  SpO2:  95%    15. Constipation, Continue MiraLAX  daily and sorbitol  as needed             - Patient got sorbitol  today, consider additional medication tomorrow if no BM        LOS: 3 days A FACE TO FACE EVALUATION WAS PERFORMED  Prentice FORBES Compton 10/31/2023, 9:45 AM

## 2023-10-31 NOTE — Progress Notes (Signed)
 Occupational Therapy Session Note  Patient Details  Name: Holly Henry MRN: [id number] Date of Birth: [birth year]  Today's Date: 10/31/2023 OT Individual Time: 9249-9094 OT Individual Time Calculation (min): 75 min    Short Term Goals: Week 1:  OT Short Term Goal 1 (Week 1): Pt will recall hemidressing techniques with min questioning cues OT Short Term Goal 2 (Week 1): Pt will maintain R UE in safe position during functional transfers, ADLs, and when sitting in wc with min verbal cues OT Short Term Goal 3 (Week 1): Pt will complete LB dresing with min A using AE and LRAD PRN OT Short Term Goal 4 (Week 1): Pt will complete toilet transfers with CGA using LRAD  Skilled Therapeutic Interventions/Progress Updates:     Patient in bed eating breakfast at the time of arrival, patient reports resting well during the night, with no report of pain.   Patient in agreement with completing BADL related task to start her day.  Patient  was able to transfer from supine in bed to EOB with CGA, she was able to transfer from EOB to w/c LOF using the RW and the bed rail for additional balance at CGA by  ambulating to the w/c .  The pt was transported to the restroom and was able transfer to the commode using the grab bar and the arm of the w/c with CGA executing a stand pivot transfer. The pt  was able to doff her LB garments, a pull-up and shorts with MinA.  The pt was able to lower herself onto the commode with closeS.  The pt was able to complete toileting hyigene with closeS. The pt was table to come from sit to stand with CGA using the grab bar and the arm of the 3 in 1.  The pt was transported from the restroom to the sink and was able to doff her over head shirt with MinA. The pt was  able to bathe her UB with s/uA, she was MinA with LB bathing inclusive of the lower portion of her LE by crossing the her LE over the opposite leg with.  The pt was able to donn her over head shirt with MinA, she was ModA for  donning LB  clothing items a brief and pants, using the reacher for additional ROM. The pt went on to complete a grooming exercise for brushing her teeth,  applying deo and lotion with s/uA. At the end to the session, the pt was transported to the recliner and was able to transfer from w/c LOF to the recliner with CGA and vc's for execution.  The call light and bedside table were placed within reach with all additional needs addressed prior to exiting the room.     Therapy Documentation Precautions:  Precautions Precautions: Fall, Other (comment) Recall of Precautions/Restrictions: Intact Precaution/Restrictions Comments: watch bp Restrictions Weight Bearing Restrictions Per Provider Order: No Therapy/Group: Individual Therapy  Elvera JONETTA Mace 10/31/2023, 12:16 PM

## 2023-10-31 NOTE — IPOC Note (Signed)
 Overall Plan of Care [facility]) Patient Details Name: Holly Henry MRN: [id number] DOB: 1956/02/20  Admitting Diagnosis: Ischemic cerebrovascular accident (CVA) [facility])  Hospital Problems: Principal Problem:   Ischemic cerebrovascular accident (CVA) (HCC)     Functional Problem List: Nursing Pain, Safety, Endurance, Medication Management, Bowel  PT Balance, Endurance, Motor, Pain, Perception, Safety  OT Balance, Pain, Safety, Endurance, Motor, Skin Integrity, Perception  SLP    TR         Basic ADL's: OT Grooming, Bathing, Dressing, Toileting     Advanced  ADL's: OT Simple Meal Preparation     Transfers: PT Bed Mobility, Bed to Chair, Car, Lobbyist, Technical brewer: PT Ambulation, Stairs     Additional Impairments: OT Fuctional Use of Upper Extremity  SLP        TR      Anticipated Outcomes Item Anticipated Outcome  Self Feeding Mod I  Swallowing      Basic self-care  Mod I-SUP  Toileting  SUP   Bathroom Transfers SUP  Bowel/Bladder  manage bowel w mod I assist  Transfers  Supervision  Locomotion  Supervisoin  Communication     Cognition     Pain  Pain < 4 with prns  Safety/Judgment  manage w cues   Therapy Plan: PT Intensity: Minimum of 1-2 x/day ,45 to 90 minutes PT Frequency: 5 out of 7 days PT Duration Estimated Length of Stay: 10-14 Days OT Intensity: Minimum of 1-2 x/day, 45 to 90 minutes OT Frequency: 5 out of 7 days OT Duration/Estimated Length of Stay: ~2-2.5 weeks     Team Interventions: Nursing Interventions Patient/Family Education, Pain Management, Medication Management, Bowel Management, Disease Management/Prevention, Discharge Planning  PT interventions Ambulation/gait training, Community reintegration, DME/adaptive equipment instruction, Neuromuscular re-education, Psychosocial support, Stair training, UE/LE Strength taining/ROM, Warden/ranger, Discharge planning, Functional electrical  stimulation, Skin care/wound management, Pain management, Therapeutic Activities, UE/LE Coordination activities, Cognitive remediation/compensation, Disease management/prevention, Functional mobility training, Visual/perceptual remediation/compensation, Therapeutic Exercise, Splinting/orthotics, Patient/family education  OT Interventions Warden/ranger, Community reintegration, Disease mangement/prevention, Neuromuscular re-education, Functional electrical stimulation, Patient/family education, Self Care/advanced ADL retraining, Splinting/orthotics, Therapeutic Exercise, UE/LE Coordination activities, Wheelchair propulsion/positioning, Cognitive remediation/compensation, Discharge planning, DME/adaptive equipment instruction, Functional mobility training, Pain management, Psychosocial support, Skin care/wound managment, Therapeutic Activities, UE/LE Strength taining/ROM, Visual/perceptual remediation/compensation  SLP Interventions    TR Interventions    SW/CM Interventions Discharge Planning, Psychosocial Support, Patient/Family Education   Barriers to Discharge MD  Medical stability and Weight  Nursing Decreased caregiver support, Home environment access/layout 2 level 1 ste main B+B w dtr/family  PT      OT      SLP      SW       Team Discharge Planning: Destination: PT-Home ,OT- Home , SLP-Home Projected Follow-up: PT-24 hour supervision/assistance, Outpatient PT, OT-  Home health OT, SLP-None Projected Equipment Needs: PT-To be determined, OT- To be determined, SLP-None recommended by SLP Equipment Details: PT- , OT-  Patient/family involved in discharge planning: PT- Patient,  OT-Patient, SLP-Patient  MD ELOS: 14-18d Medical Rehab Prognosis:  Good Assessment: The patient has been admitted for CIR therapies with the diagnosis of Ischemic CVA. The team will be addressing functional mobility, strength, stamina, balance, safety, adaptive techniques and equipment, self-care,  bowel and bladder mgt, patient and caregiver education, BP management . Goals have been set at sup/mod I. Anticipated discharge destination is home with family assist.        See Team Conference Notes  for weekly updates to the plan of care

## 2023-10-31 NOTE — Progress Notes (Signed)
 Physical Therapy Session Note  Patient Details  Name: Holly Henry MRN: [id number] Date of Birth: Mar 21, 1956  Today's Date: 10/31/2023 PT Individual Time: 8984-8884 PT Individual Time Calculation (min): 60 min   Short Term Goals: Week 1:  PT Short Term Goal 1 (Week 1): Pt willl complete bed mobility with CGA PT Short Term Goal 2 (Week 1): Pt will complete bed to chair with CGA PT Short Term Goal 3 (Week 1): Pt will ambulate x150' with CGA and LRAD PT Short Term Goal 4 (Week 1): Pt will improve Berg by MCID  Skilled Therapeutic Interventions/Progress Updates:    Pt received in recliner and agreeable to therapy. Pt asleep in bed on arrival but easily roused and agreeable to therapy. Pt reports chronic pain at her normal level, with some increase in soreness with activity. Rest and positioning as needed, positioned on k pad before end of session. Sit to stand throughout session with supervision to CGA. Gait throughout session with RW and CGA. Note pt with increased knee flexion in stance both on first steps and with fatigue. Cues for upright posture and improved foot clearance/heel strike. Pt requested  to use bathroom, ambulatory transfer to toilet with continent bladder void with supervision. CGA for hand hygiene at sink. Pt then ambulated to therapy gym as described above. Performed x 4 steps with 2 hand rails with CGA overall. Discussed leading with strong foot for increased safety when performing in community. Pt then performed obstacle course 3 x 3 laps including stairs with 1 or 2 hand rails, hurdles, walking over compliant surface, and tight turns. For second 2 laps, added 4 lb ankle weights for increased effort. Pt returned to room and to bed after session, was left with all needs in reach and alarm active.   Therapy Documentation Precautions:  Precautions Precautions: Fall, Other (comment) Recall of Precautions/Restrictions: Intact Precaution/Restrictions Comments: watch  bp Restrictions Weight Bearing Restrictions Per Provider Order: No General:       Therapy/Group: Individual Therapy  [name] 10/31/2023, 11:13 AM

## 2023-10-31 NOTE — Progress Notes (Signed)
 Occupational Therapy Session Note  Patient Details  Name: Holly Henry MRN: [id number] Date of Birth: January 29, 1956  Today's Date: 10/31/2023 OT Individual Time: 1345-1456 OT Individual Time Calculation (min): 71 min    Short Term Goals: Week 1:  OT Short Term Goal 1 (Week 1): Pt will recall hemidressing techniques with min questioning cues OT Short Term Goal 2 (Week 1): Pt will maintain R UE in safe position during functional transfers, ADLs, and when sitting in wc with min verbal cues OT Short Term Goal 3 (Week 1): Pt will complete LB dresing with min A using AE and LRAD PRN OT Short Term Goal 4 (Week 1): Pt will complete toilet transfers with CGA using LRAD   Skilled Therapeutic Interventions/Progress Updates:    Pt bed level at time of session in 7/10 pain in B shoulders, had pain meds already and heating pad - pt also with underlying chronic pain from fibromyalgia and arthritis - still wanting to participate. Supine > sit with Supervision, ambulating bed > bathroom > sink all with CGA and toileting overall MIN A for fully getting up R side. Standing for hand hygiene with CGA. OT providing soft tissue massage along B shoulder musculature with pt reporting a reduction in pain along trap, rhombiods, etc all to improve pain. Pt transported to/from gym via wheelchair and focused on functional movement and use for RUE with the following: towel slides, towel squeezes, pinches, R hand ROM composite and individual, stacking blocks for 5 trials using R hand to match, clips grade green and blue, and squigz all using R hand to further improve reaching, ROM, and functional grip strength. Back in room, ambulated to bed with CGA with RW. Call bell in reach all needs met.   Therapy Documentation Precautions:  Precautions Precautions: Fall, Other (comment) Recall of Precautions/Restrictions: Intact Precaution/Restrictions Comments: watch bp Restrictions Weight Bearing Restrictions Per Provider Order:  No     Therapy/Group: Individual Therapy  Chiquita JAYSON Hopping 10/31/2023, 7:23 AM

## 2023-11-01 DIAGNOSIS — G8191 Hemiplegia, unspecified affecting right dominant side: Secondary | ICD-10-CM | POA: Diagnosis not present

## 2023-11-01 DIAGNOSIS — I639 Cerebral infarction, unspecified: Secondary | ICD-10-CM | POA: Diagnosis not present

## 2023-11-01 DIAGNOSIS — M069 Rheumatoid arthritis, unspecified: Secondary | ICD-10-CM | POA: Diagnosis not present

## 2023-11-01 DIAGNOSIS — I482 Chronic atrial fibrillation, unspecified: Secondary | ICD-10-CM | POA: Diagnosis not present

## 2023-11-01 NOTE — Progress Notes (Signed)
 PROGRESS NOTE   Subjective/Complaints:  Has tender spot just left of midline near crown of scalp, denies trauma to area, no itching, nurse could not see anything yesterday , pt feels like it may be improving  ROS- neg CP, SOB,  N/V/D Objective:   No results found. No results for input(s): WBC, HGB, HCT, PLT in the last 72 hours.  No results for input(s): NA, K, CL, CO2, GLUCOSE, BUN, CREATININE, CALCIUM  in the last 72 hours.   Intake/Output Summary (Last 24 hours) at 11/01/2023 0955 Last data filed at 11/01/2023 0724 Gross per 24 hour  Intake 336 ml  Output --  Net 336 ml        Physical Exam: Vital Signs Blood pressure 132/60, pulse 95, temperature 98 F (36.7 C), temperature source Oral, resp. rate 18, height 5' 1 (1.549 m), weight 78.1 kg, SpO2 96%.   General: No acute distress Mood and affect are appropriate Heart: Regular rate and rhythm no rubs murmurs or extra sounds Lungs: Clear to auscultation, breathing unlabored, no rales or wheezes Abdomen: Positive bowel sounds, soft nontender to palpation, nondistended Extremities: No clubbing, cyanosis, or edema Skin: No evidence of breakdown, no evidence of rash, scalp is clear mild tenderness just left of midline near vertex Neurologic: Cranial nerves II through XII intact, motor strength is 5/5 in left and 3-/5 right deltoid, bicep, tricep, grip, 5/5 left and 4/5 right hip flexor, knee extensors, ankle dorsiflexor and plantar flexor Sensory exam normal sensation to light touch in bilateral upper and lower extremities Cerebellar exam normal finger to nose to finger Musculoskeletal: MCP PIP and DIP deformities in bilateral hands unable to make fist on left side related to deformities    Assessment/Plan: 1. Functional deficits which require 3+ hours per day of interdisciplinary therapy in a comprehensive inpatient rehab setting. Physiatrist is  providing close team supervision and 24 hour management of active medical problems listed below. Physiatrist and rehab team continue to assess barriers to discharge/monitor patient progress toward functional and medical goals  Care Tool:  Bathing    Body parts bathed by patient: Right arm, Chest, Abdomen, Front perineal area, Right upper leg, Left upper leg   Body parts bathed by helper: Left arm, Buttocks, Right lower leg, Left lower leg     Bathing assist Assist Level: Moderate Assistance - Patient 50 - 74%     Upper Body Dressing/Undressing Upper body dressing   What is the patient wearing?: Pull over shirt    Upper body assist Assist Level: Moderate Assistance - Patient 50 - 74%    Lower Body Dressing/Undressing Lower body dressing      What is the patient wearing?: Underwear/pull up, Pants     Lower body assist Assist for lower body dressing: Maximal Assistance - Patient 25 - 49%     Toileting Toileting    Toileting assist Assist for toileting: Minimal Assistance - Patient > 75%     Transfers Chair/bed transfer  Transfers assist     Chair/bed transfer assist level: Minimal Assistance - Patient > 75%     Locomotion Ambulation   Ambulation assist      Assist level: Minimal Assistance - Patient >  75% Assistive device: No Device Max distance: 120'   Walk 10 feet activity   Assist     Assist level: Minimal Assistance - Patient > 75% Assistive device: No Device   Walk 50 feet activity   Assist    Assist level: Minimal Assistance - Patient > 75% Assistive device: No Device    Walk 150 feet activity   Assist Walk 150 feet activity did not occur: Safety/medical concerns         Walk 10 feet on uneven surface  activity   Assist     Assist level: Minimal Assistance - Patient > 75%     Wheelchair     Assist Is the patient using a wheelchair?: Yes Type of Wheelchair: Manual    Wheelchair assist level: Dependent - Patient  0% Max wheelchair distance: 150'    Wheelchair 50 feet with 2 turns activity    Assist        Assist Level: Dependent - Patient 0%   Wheelchair 150 feet activity     Assist      Assist Level: Dependent - Patient 0%   Blood pressure 132/60, pulse 95, temperature 98 F (36.7 C), temperature source Oral, resp. rate 18, height 5' 1 (1.549 m), weight 78.1 kg, SpO2 96%.  Medical Problem List and Plan: 1. 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  Antithrombotics: -DVT/anticoagulation:  Pharmaceutical: Other (comment) Pradaxa              -antiplatelet therapy: N/A 3. Pain Management: Oxycodone  5 mg every 4 hours as needed,  tramadol  for moderate pain              - Pt reports she was previously discharged from [facility] pain clinic after oxycodone  was not found in UDS Change oxy IR to q 6h pain >6/10- plan to wean off prior to d/c Use tramadol  50mg  q6h prn pain >3, <7   4. Mood/Behavior/Sleep: BuSpar  15 mg twice daily, Cymbalta  90 mg daily, melatonin 5 mg nightly as needed             -antipsychotic agents: N/A 5. Neuropsych/cognition: This patient is capable of making decisions on her own behalf. 6. Skin/Wound Care: Routine skin checks No scalp lesions to correlate with area of mild tenderness to palpation left of vertex, MRI showing no skull or scalp abnormalities  7. Fluids/Electrolytes/Nutrition: Routine in and outs with follow-up chemistries 8.  Hyperlipidemia.  Lipitor /Zetia  9.  GERD.  Protonix  10. Atrial fibrillation.  Continue Pradaxa .  Cardiac rate controlled. 11.  Prediabetes.  Hemoglobin A1c 6.0.  Currently on a regular diet.  CBGs discontinued 12. Obesity.  Dietary counseling             Body mass index is  31.95 kg/m. 13, Rheumatoid arthritis/Fibromyalgia              -Continue Cymbalta , pain control             -She says was on methotrexate in the past but this was stopped by her rheumatologist about 6 months ago, states she has been on Orencia              -  She was on gabapentin  300mg  TID before admission, Restart at lower dose 100mg  TID for now, could consider increasing back up at a later time 14.  Hypertension.  Continue metoprolol . Vitals:   10/31/23 1927 11/01/23 0428  BP: (!) 144/80 132/60  Pulse: 100 95  Resp: 18 18  Temp: 98.1 F (36.7 C) 98 F (36.7 C)  SpO2: 96% 96%    15. Constipation, Continue MiraLAX  daily and sorbitol  as needed             - Patient got sorbitol  today, consider additional medication tomorrow if no BM        LOS: 4 days A FACE TO FACE EVALUATION WAS PERFORMED  Prentice FORBES Compton 11/01/2023, 9:55 AM

## 2023-11-02 DIAGNOSIS — M797 Fibromyalgia: Secondary | ICD-10-CM | POA: Diagnosis not present

## 2023-11-02 DIAGNOSIS — F331 Major depressive disorder, recurrent, moderate: Secondary | ICD-10-CM

## 2023-11-02 DIAGNOSIS — I10 Essential (primary) hypertension: Secondary | ICD-10-CM | POA: Diagnosis not present

## 2023-11-02 DIAGNOSIS — F411 Generalized anxiety disorder: Secondary | ICD-10-CM

## 2023-11-02 DIAGNOSIS — D649 Anemia, unspecified: Secondary | ICD-10-CM

## 2023-11-02 DIAGNOSIS — R7989 Other specified abnormal findings of blood chemistry: Secondary | ICD-10-CM

## 2023-11-02 DIAGNOSIS — K5900 Constipation, unspecified: Secondary | ICD-10-CM | POA: Diagnosis not present

## 2023-11-02 DIAGNOSIS — I639 Cerebral infarction, unspecified: Secondary | ICD-10-CM | POA: Diagnosis not present

## 2023-11-02 MED ORDER — GABAPENTIN 300 MG PO CAPS
300.0000 mg | ORAL_CAPSULE | Freq: Three times a day (TID) | ORAL | Status: DC
  Administered 2023-11-02 – 2023-11-11 (×26): 300 mg via ORAL
  Filled 2023-11-02 (×26): qty 1

## 2023-11-02 MED ORDER — POLYETHYLENE GLYCOL 3350 17 G PO PACK
17.0000 g | PACK | Freq: Every day | ORAL | Status: DC
  Administered 2023-11-03 – 2023-11-11 (×7): 17 g via ORAL
  Filled 2023-11-02 (×9): qty 1

## 2023-11-02 NOTE — Progress Notes (Signed)
 PROGRESS NOTE   Subjective/Complaints: Continues to have some continued chronic neck and pain in her bilateral shoulder/bilateral trapezius.    ROS- neg abdominal pain, CP, chills,  N/V/D, shortness of breath Objective:   No results found. No results for input(s): WBC, HGB, HCT, PLT in the last 72 hours.  No results for input(s): NA, K, CL, CO2, GLUCOSE, BUN, CREATININE, CALCIUM  in the last 72 hours.   Intake/Output Summary (Last 24 hours) at 11/02/2023 1253 Last data filed at 11/02/2023 9277 Gross per 24 hour  Intake 580 ml  Output --  Net 580 ml        Physical Exam: Vital Signs Blood pressure (!) 168/89, pulse 69, temperature 97.6 F (36.4 C), resp. rate 17, height 5' 1 (1.549 m), weight 78.1 kg, SpO2 97%.   General: No acute distress Mood and affect are appropriate Heart: Regular rate and rhythm no rubs murmurs or extra sounds Lungs: Clear to auscultation, breathing unlabored, no rales or wheezes Abdomen: Positive bowel sounds, soft nontender to palpation, nondistended Extremities: No clubbing, cyanosis, or edema Skin: No evidence of breakdown, no evidence of rash, scalp is clear mild tenderness just left of midline near vertex Neurologic: Cranial nerves II through XII intact, motor strength is 5/5 in left and 3-/5 right deltoid, bicep, tricep, grip, 5/5 left and 4/5 right hip flexor, knee extensors, ankle dorsiflexor and plantar flexor Sensory exam normal sensation to light touch in bilateral upper and lower extremities Cerebellar exam normal finger to nose to finger Musculoskeletal: MCP PIP and DIP deformities in bilateral hands unable to make fist on left side related to deformities  TTP bilateral upper trapezius muscles and C-spine paraspinal muscle  Assessment/Plan: 1. Functional deficits which require 3+ hours per day of interdisciplinary therapy in a comprehensive inpatient rehab  setting. Physiatrist is providing close team supervision and 24 hour management of active medical problems listed below. Physiatrist and rehab team continue to assess barriers to discharge/monitor patient progress toward functional and medical goals  Care Tool:  Bathing    Body parts bathed by patient: Right arm, Chest, Abdomen, Front perineal area, Right upper leg, Left upper leg   Body parts bathed by helper: Left arm, Buttocks, Right lower leg, Left lower leg     Bathing assist Assist Level: Moderate Assistance - Patient 50 - 74%     Upper Body Dressing/Undressing Upper body dressing   What is the patient wearing?: Pull over shirt    Upper body assist Assist Level: Moderate Assistance - Patient 50 - 74%    Lower Body Dressing/Undressing Lower body dressing      What is the patient wearing?: Underwear/pull up, Pants     Lower body assist Assist for lower body dressing: Maximal Assistance - Patient 25 - 49%     Toileting Toileting    Toileting assist Assist for toileting: Minimal Assistance - Patient > 75%     Transfers Chair/bed transfer  Transfers assist     Chair/bed transfer assist level: Minimal Assistance - Patient > 75%     Locomotion Ambulation   Ambulation assist      Assist level: Minimal Assistance - Patient > 75% Assistive device: No Device  Max distance: 120'   Walk 10 feet activity   Assist     Assist level: Minimal Assistance - Patient > 75% Assistive device: No Device   Walk 50 feet activity   Assist    Assist level: Minimal Assistance - Patient > 75% Assistive device: No Device    Walk 150 feet activity   Assist Walk 150 feet activity did not occur: Safety/medical concerns         Walk 10 feet on uneven surface  activity   Assist     Assist level: Minimal Assistance - Patient > 75%     Wheelchair     Assist Is the patient using a wheelchair?: Yes Type of Wheelchair: Manual    Wheelchair assist  level: Dependent - Patient 0% Max wheelchair distance: 150'    Wheelchair 50 feet with 2 turns activity    Assist        Assist Level: Dependent - Patient 0%   Wheelchair 150 feet activity     Assist      Assist Level: Dependent - Patient 0%   Blood pressure (!) 168/89, pulse 69, temperature 97.6 F (36.4 C), resp. rate 17, height 5' 1 (1.549 m), weight 78.1 kg, SpO2 97%.  Medical Problem List and Plan: 1. 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  Antithrombotics: -DVT/anticoagulation:  Pharmaceutical: Other (comment) Pradaxa              -antiplatelet therapy: N/A 3. Pain Management: Oxycodone  5 mg every 4 hours as needed,  tramadol  for moderate pain              - Pt reports she was previously discharged from [facility] pain clinic after oxycodone  was not found in UDS Change oxy IR to q 6h pain >6/10- plan to wean off prior to d/c Use tramadol  50mg  q6h prn pain >3, <7  - Increase gabapentin  to 300 mg 3 times daily  4. Mood/Behavior/Sleep: BuSpar  15 mg twice daily, Cymbalta  90 mg daily, melatonin 5 mg nightly as needed             -antipsychotic agents: N/A 5. Neuropsych/cognition: This patient is capable of making decisions on her own behalf. 6. Skin/Wound Care: Routine skin checks No scalp lesions to correlate with area of mild tenderness to palpation left of vertex, MRI showing no skull or scalp abnormalities  7. Fluids/Electrolytes/Nutrition: Routine in and outs with follow-up chemistries 8.  Hyperlipidemia.  Lipitor /Zetia  9.  GERD.  Protonix  10. Atrial fibrillation.  Continue Pradaxa .  Cardiac rate controlled. 11.  Prediabetes.  Hemoglobin A1c 6.0.  Currently on a regular diet.  CBGs discontinued 12. Obesity.   Dietary counseling             Body mass index is 31.95 kg/m. 13, Rheumatoid arthritis/Fibromyalgia              -Continue Cymbalta , pain control             -She says was on methotrexate in the past but this was stopped by her rheumatologist about 6 months ago, states she has been  on Orencia              -She was on gabapentin  300mg  TID before admission, Restart at lower dose 100mg  TID for now, could consider increasing back up at a later time- see No 3, increase gabapentin  dose 14.  Hypertension.  Continue metoprolol . Vitals:   11/01/23 1948 11/02/23 0415  BP: (!) 149/91 (!) 168/89  Pulse: 89 69  Resp: 18 17  Temp: 98.4 F (36.9 C) 97.6 F (36.4 C)  SpO2: 97% 97%  7/21 intermittently elevated, continue to monitor trend for now  15. Constipation, Continue MiraLAX  daily and sorbitol  as needed             - Patient got sorbitol  today, consider additional medication tomorrow if no BM  - 7/21 LBM recorded 7/17, scheduled MiraLAX , also will asked nursing to confirm and will do sorbitol  if this is the case 16. Aztomia, recheck bmp tomorrow 17. Anemia mild, recheck tomorrow        LOS: 5 days A FACE TO FACE EVALUATION WAS PERFORMED  Murray Collier 11/02/2023, 12:53 PM

## 2023-11-02 NOTE — Progress Notes (Signed)
 Occupational Therapy Session Note  Patient Details  Name: Holly Henry MRN: [id number] Date of Birth: Apr 11, 1956  Today's Date: 11/02/2023 OT Individual Time: 1106-1200 OT Individual Time Calculation (min): 54 min  Session 2: OT Individual Time: 8596-8570 OT Individual Time Calculation (min): 26 min   Short Term Goals: Week 1:  OT Short Term Goal 1 (Week 1): Pt will recall hemidressing techniques with min questioning cues OT Short Term Goal 2 (Week 1): Pt will maintain R UE in safe position during functional transfers, ADLs, and when sitting in wc with min verbal cues OT Short Term Goal 3 (Week 1): Pt will complete LB dresing with min A using AE and LRAD PRN OT Short Term Goal 4 (Week 1): Pt will complete toilet transfers with CGA using LRAD  Skilled Therapeutic Interventions/Progress Updates:     AM Session:  Pt received semi-reclined in bed presenting to be in good spirits receptive to skilled OT session reporting 4/10 pain in shoulder L>R with K-pad applied upon OT arrival- OT offering intermittent rest breaks, repositioning, and therapeutic support to optimize participation in therapy session. Pt requesting to take shower this AM- focused this session on ADL retraining with focus on L UE functional use, dynamic balance, and safety awareness. Engaged Pt in completing functional mobility to bathroom no AD for increased balance challenge to work on reciprocal B U/LE movements and dynamic balance- min HHA provided, Pt presenting with slowed gait speed and decreased step length. She transferred to standard toilet with min HHA using grab bar and completed 3/3 toileting tasks with CGA completing peri-care in seated position. Ambulatory transfer to walk-in shower min HHA using grab bar. Pt completed majority of U/LB bathing in seated balance to increase safety and conserve energy. Facilitated NMRE to R UE by engaging R UE in bathing tasks and utilizing it for [facility] management. Pt able to utilize R UE  at a gross assist level overall with improved grasp and digit extension noted. UB bathing completed with min A and LB bathing completed with CGA provided when standing to wash buttocks. Educated on long handled sponge use with Pt demo-ing teach back as evidence of learning. Following shower U/LB dressing completed sitting EOB. Pt able to donn OH shirt with set-up and pants with CGA provided when standing with RW to bring pans to waist. Donned shoes set-up. Pt completed grooming/hygiene tasks in standing at sink using RW with Pt able to manipulate toiletry items for oral care without dropping this session- increased effort noted when squeezing denture paste. Pt was left resting in wc with call bell in reach, seatbelt alarm on, and all needs met.    PM Session:  Pt received in therapy gym finishing up PT session presenting to be in good spirits receptive to skilled OT session reporting 6/10 pain 2/2 RA in knees and shoulders- OT offering intermittent rest breaks, repositioning, and therapeutic support to optimize participation in therapy session. Focused this session on HEP education. Issued Pt tan there-putty and HEP handout with pictures and written instructions provided. Guided Pt through completing each exercise to support learning and carryover.Pt completed 1x4 reps of the following exercises using tan (light resistance) there-putty to increase hand strength and functional use for ADLs:  -Gross grasp -Thumb adduction -Key pinch  -Thumb flexion -Digit extension -Digit abduction -Digit adduction -Thumb to finger opposition Pt able to complete all exercises in pain free ROM.  Pt completed functional mobility back to room using RW at end of session with CGA provided for  balance. Pt was left resting in bed with call bell in reach, bed alarm on, and all needs met.    Therapy Documentation Precautions:  Precautions Precautions: Fall, Other (comment) Recall of Precautions/Restrictions:  Intact Precaution/Restrictions Comments: watch bp Restrictions Weight Bearing Restrictions Per Provider Order: No   Therapy/Group: Individual Therapy  Katheryn SHAUNNA Mines 11/02/2023, 7:57 AM

## 2023-11-02 NOTE — Consult Note (Signed)
 Neuropsychological Consultation Comprehensive Inpatient Rehab   Patient:   Holly Henry   DOB:   1955-09-13  MR Number:  [id number]  Location:  [facility] [address] CENTER A [address] [place] [address] Dept: [phone] Loc: [phone]           Date of Service:   11/02/2023  Start Time:   10 AM End Time:   7 AM  Provider/Observer:  Norleen Asa, Psy.D.       Clinical Neuropsychologist       Billing Code/Service: [phone]  Reason for Service:    Holly Henry is a 68 year old female referred for neuropsychological consultation due to cognitive and adjustment issues with a past history including anxiety, depression, fibromyalgia, and rheumatoid arthritis.  History of Present Illness: Presented on 10/25/2023 with acute onset of right-sided weakness and sensory loss. Experienced an Eliquis  failure stroke despite being on Eliquis . Medication has since been changed to Pradaxa . Admitted to the Comprehensive Inpatient Rehabilitation (CIR) unit due to decreased functionality with right-sided weakness after therapy evaluations were completed.  Reports recurrent episodes of acute confusion over the past year, leading to multiple emergency room visits. These episodes have consistently been associated with bladder infections. Currently taking medication (methanamine) to prevent urinary tract infections. An episode in March 2024 involved significant confusion while driving grandchildren, requiring her granddaughter to assist with steering. Has not driven since this incident. Also experienced a fall at that time.  Past Medical History: - Atrial fibrillation (A-fib), significant - Hypertension - Hyperlipidemia - Rheumatoid arthritis (RA). Previously on methotrexate, discontinued approximately six months prior due to possible side effects. - Gastroesophageal reflux disease (GERD) - Irritable bowel syndrome (IBS) - Pre-diabetes -  History of transient ischemic attacks (TIAs) / cerebrovascular accident (CVA)  Psychiatric History: - Followed by [facility] for moderate recurrent major depressive disorder and generalized anxiety disorder. - History of being on antidepressants since her 8s. - Current medications for mental health include Buspar  and Cymbalta , which are being continued on the unit.  Social History: - Lives with her daughter and three grandchildren. - Reports significant financial stress, living on her Social Security income. - Anticipates needing to move again within the next couple of months. - Daughter is applying for disability due to an unspecified autoimmune disease, possibly lupus.  Family History: - Mother had a history of seven strokes.  Recent Investigations: - Cranial CT scan (10/25/2023): Negative for acute changes. Showed chronic microvascular ischemic changes. TNK was not administered. - MRI of the brain: Showed an acute non-hemorrhagic infarct involving the left precentral gyrus and small lacunar white matter infarcts involving the left temporal-occipital junction and left parietal lobe.  Neuropsychological Status: - Cognitively, reports feeling back to her baseline. No subjective deficits in thinking or speech since the stroke. - Primary deficits post-stroke were motor-related. - Right-sided weakness:     - Initially unable to move the right hand.     - As of yesterday, able to move all fingers.     - Right leg was weak and wobbly; walked without a walker for the first time this morning. - Reports ongoing neck pain radiating down the arm, which has worsened since the stroke and is disrupting sleep. The neurologist had previously prescribed tizanidine  for this, which has been restarted, but the pain persists. Plans to discuss this with the attending physician, Dr. Carilyn.  Medical History:   Past Medical History:  Diagnosis Date   A-fib (HCC)  Fibromyalgia    High cholesterol     Hypertension    Osteoarthritis    Prediabetes    Rheumatoid arthritis [facility])         Patient Active Problem List   Diagnosis Date Noted   Major depressive disorder, recurrent episode, moderate (HCC) 11/02/2023   GAD (generalized anxiety disorder) 11/02/2023   Ischemic cerebrovascular accident (CVA) (HCC) 10/28/2023   Stroke-like symptoms 10/26/2023   Acute stroke due to ischemia [facility]) 10/26/2023     Family Med/Psych History: History reviewed. No pertinent family history.   Impression/DX:   Patient is a 68 year old right-handed female with a complex medical and psychiatric history, status post acute left-sided ischemic stroke, presenting with right-sided motor deficits. Cognitive functioning appears to be at her baseline per self-report, consistent with the location of the infarct. Motor function is showing significant improvement. Mood and anxiety symptoms are present in the context of multiple psychosocial stressors, including financial strain and housing instability.  Disposition/Plan:    - Continue with current inpatient rehabilitation program on the stroke team, managed by Dr. Abigail. - Continue home medications for depression and anxiety (Cymbalta , Buspar ). No acute changes to psychotropic medications will be made on this unit. - Will monitor mood and anxiety. Advised to report any significant worsening of mood symptoms. - Advised to follow up with the attending physician regarding persistent neck and arm pain. - Counseled on the expected recovery trajectory after a stroke, noting that progress to date is a positive prognostic indicator for continued improvement. - Counseled on the role of the physiatrist and the multidisciplinary rehab team. - Counseled on using MyChart to view test results, with a caution against misinterpreting medical documentation and using [employer] for medical information.  Diagnosis:    History of Major Depression and Generalized Anxiety  Disorder         Electronically Signed   _______________________ Norleen Asa, Psy.D. Clinical Neuropsychologist

## 2023-11-02 NOTE — Progress Notes (Signed)
 Physical Therapy Session Note  Patient Details  Name: Holly Henry MRN: [id number] Date of Birth: May 03, 1955  {CHL IP REHAB PT TIME CALCULATION:304800500}  Short Term Goals: {DUH:6958314}  Skilled Therapeutic Interventions/Progress Updates:      Therapy Documentation Precautions:  Precautions Precautions: Fall, Other (comment) Recall of Precautions/Restrictions: Intact Precaution/Restrictions Comments: watch bp Restrictions Weight Bearing Restrictions Per Provider Order: No General:   Vital Signs:   Pain:   Mobility:   Locomotion :    Trunk/Postural Assessment :    Balance:   Exercises:   Other Treatments:      Therapy/Group: {Therapy/Group:3049007}  [name] 11/02/2023, 1:41 PM

## 2023-11-02 NOTE — Progress Notes (Signed)
 Physical Therapy Session Note  Patient Details  Name: Holly Henry MRN: [id number] Date of Birth: July 08, 1955  Today's Date: 11/02/2023 PT Individual Time: 0805-0901 PT Individual Time Calculation (min): 56 min   Short Term Goals: Week 1:  PT Short Term Goal 1 (Week 1): Pt willl complete bed mobility with CGA PT Short Term Goal 2 (Week 1): Pt will complete bed to chair with CGA PT Short Term Goal 3 (Week 1): Pt will ambulate x150' with CGA and LRAD PT Short Term Goal 4 (Week 1): Pt will improve Berg by MCID  Skilled Therapeutic Interventions/Progress Updates:  Patient supine in bed on entrance to room. Patient alert and agreeable to PT session.   Patient with no pain complaint at start of session.  Therapeutic Activity: Bed Mobility: Pt performed supine > sit with supervision and requiring time and increased effort. VC/ tc required for increased effort in push up from bed surface. Transfers: Pt performed sit<>stand and stand pivot tranfers at start of session with CGA and RW. Provided cueing for use of BUE to push from bed surface in order t o  Gait Training:  Pt ambulated *** ft using *** with ***. Demonstrated ***. Provided vc/ tc for ***.  Patient *** at end of session with brakes locked, *** alarm set, and all needs within reach.   Therapy Documentation Precautions:  Precautions Precautions: Fall, Other (comment) Recall of Precautions/Restrictions: Intact Precaution/Restrictions Comments: watch bp Restrictions Weight Bearing Restrictions Per Provider Order: No General:   Vital Signs:   Pain:     Therapy/Group: Individual Therapy  [name] PT, DPT, CSRS 11/02/2023, 12:22 PM

## 2023-11-03 ENCOUNTER — Inpatient Hospital Stay (HOSPITAL_COMMUNITY)

## 2023-11-03 DIAGNOSIS — I482 Chronic atrial fibrillation, unspecified: Secondary | ICD-10-CM | POA: Diagnosis not present

## 2023-11-03 DIAGNOSIS — G8191 Hemiplegia, unspecified affecting right dominant side: Secondary | ICD-10-CM | POA: Diagnosis not present

## 2023-11-03 DIAGNOSIS — I639 Cerebral infarction, unspecified: Secondary | ICD-10-CM | POA: Diagnosis not present

## 2023-11-03 DIAGNOSIS — M069 Rheumatoid arthritis, unspecified: Secondary | ICD-10-CM | POA: Diagnosis not present

## 2023-11-03 LAB — BASIC METABOLIC PANEL WITH GFR
Anion gap: 9 (ref 5–15)
BUN: 20 mg/dL (ref 8–23)
CO2: 25 mmol/L (ref 22–32)
Calcium: 9.1 mg/dL (ref 8.9–10.3)
Chloride: 101 mmol/L (ref 98–111)
Creatinine, Ser: 0.8 mg/dL (ref 0.44–1.00)
GFR, Estimated: >60 mL/min (ref >60)
Glucose, Bld: 142 mg/dL — ABNORMAL HIGH (ref 70–99)
Potassium: 4.2 mmol/L (ref 3.5–5.1)
Sodium: 135 mmol/L (ref 135–145)

## 2023-11-03 LAB — CBC
HCT: 36.8 % (ref 36.0–46.0)
Hemoglobin: 11.6 g/dL — ABNORMAL LOW (ref 12.0–15.0)
MCH: 30.3 pg (ref 26.0–34.0)
MCHC: 31.5 g/dL (ref 30.0–36.0)
MCV: 96.1 fL (ref 80.0–100.0)
Platelets: 235 K/uL (ref 150–400)
RBC: 3.83 MIL/uL — ABNORMAL LOW (ref 3.87–5.11)
RDW: 12.7 % (ref 11.5–15.5)
WBC: 6.7 K/uL (ref 4.0–10.5)
nRBC: 0 % (ref 0.0–0.2)

## 2023-11-03 LAB — GLUCOSE, CAPILLARY: Glucose-Capillary: 116 mg/dL — ABNORMAL HIGH (ref 70–99)

## 2023-11-03 MED ORDER — METOPROLOL SUCCINATE ER 25 MG PO TB24
25.0000 mg | ORAL_TABLET | Freq: Every day | ORAL | Status: DC
  Administered 2023-11-03 – 2023-11-08 (×6): 25 mg via ORAL
  Filled 2023-11-03 (×7): qty 1

## 2023-11-03 MED ORDER — ALBUTEROL SULFATE (2.5 MG/3ML) 0.083% IN NEBU
2.5000 mg | INHALATION_SOLUTION | RESPIRATORY_TRACT | Status: DC | PRN
  Administered 2023-11-03: 2.5 mg via RESPIRATORY_TRACT
  Filled 2023-11-03: qty 3

## 2023-11-03 MED ORDER — HYDROCODONE-ACETAMINOPHEN 5-325 MG PO TABS
1.0000 | ORAL_TABLET | Freq: Four times a day (QID) | ORAL | Status: DC | PRN
  Administered 2023-11-03 – 2023-11-08 (×9): 1 via ORAL
  Filled 2023-11-03 (×9): qty 1

## 2023-11-03 MED ORDER — ALBUTEROL SULFATE (2.5 MG/3ML) 0.083% IN NEBU: 2.5000 mg | INHALATION_SOLUTION | RESPIRATORY_TRACT | Status: DC

## 2023-11-03 NOTE — Progress Notes (Signed)
 Physical Therapy Session Note  Patient Details  Name: Holly Henry MRN: [id number] Date of Birth: November 13, 1955  Today's Date: 11/03/2023 PT Individual Time: 1015-1100 PT Individual Time Calculation (min): 45 min   Short Term Goals: Week 1:  PT Short Term Goal 1 (Week 1): Pt willl complete bed mobility with CGA PT Short Term Goal 2 (Week 1): Pt will complete bed to chair with CGA PT Short Term Goal 3 (Week 1): Pt will ambulate x150' with CGA and LRAD PT Short Term Goal 4 (Week 1): Pt will improve Berg by MCID  Skilled Therapeutic Interventions/Progress Updates:    pt received in bed and agreeable to therapy. Pt reports chronic knee and hand pain, unrated, premedicated. Rest and positioning provided as needed. Pt also reports having a headache this am but greatly improved.   Pt requesting to change into pants. Pt retrieved pants from closet with cga, therapist cued to place over front of RW vs holding in hand for safety. Pt then ambulated to toilet for continent bladder void and then changed pants with distant supervision.   Pt ambulated to/from gym with supervision fading to CGA with fatigue. Note low step height and decr heel strike on RLE, pt cued for upright posture decr RW reliance with good improvement.   Pt participated in several bouts of 2 laps each navigating 6 and 3 steps and walking around cones with CGA with no AD to light min a for some steps. Progressed to no hand rails for steps for independence with stair navigation in home environment and confidence with turning and gait with no AD. Pt demoes improved mechanics and confidence with repetition, but did become fatigued. Pt returned to room and to bed after session with supervision and was left with all needs in reach and alarm active.   Therapy Documentation Precautions:  Precautions Precautions: Fall, Other (comment) Recall of Precautions/Restrictions: Intact Precaution/Restrictions Comments: watch bp Restrictions Weight  Bearing Restrictions Per Provider Order: No General:      Therapy/Group: Individual Therapy  [name] 11/03/2023, 12:39 PM

## 2023-11-03 NOTE — Progress Notes (Signed)
 Physical Therapy Session Note  Patient Details  Name: Holly Henry MRN: [id number] Date of Birth: November 06, 1955  {CHL IP REHAB PT TIME CALCULATION:304800500}  Short Term Goals: {DUH:6958314}  Skilled Therapeutic Interventions/Progress Updates:      Therapy Documentation Precautions:  Precautions Precautions: Fall, Other (comment) Recall of Precautions/Restrictions: Intact Precaution/Restrictions Comments: watch bp Restrictions Weight Bearing Restrictions Per Provider Order: No General:   Vital Signs:   Pain: Pain Assessment Pain Scale: 0-10 Pain Score: 8  Pain Location: Head Mobility:   Locomotion :    Trunk/Postural Assessment :    Balance:   Exercises:   Other Treatments:      Therapy/Group: {Therapy/Group:3049007}  Holly Henry 11/03/2023, 1:14 PM

## 2023-11-03 NOTE — Progress Notes (Signed)
 Occupational Therapy Session Note  Patient Details  Name: Holly Henry MRN: [id number] Date of Birth: 1955-06-09  Today's Date: 11/03/2023 OT Individual Time: 1417-1530 OT Individual Time Calculation (min): 73 min    Short Term Goals: Week 1:  OT Short Term Goal 1 (Week 1): Pt will recall hemidressing techniques with min questioning cues OT Short Term Goal 2 (Week 1): Pt will maintain R UE in safe position during functional transfers, ADLs, and when sitting in wc with min verbal cues OT Short Term Goal 3 (Week 1): Pt will complete LB dresing with min A using AE and LRAD PRN OT Short Term Goal 4 (Week 1): Pt will complete toilet transfers with CGA using LRAD  Skilled Therapeutic Interventions/Progress Updates:     Pt received semi-reclined in bed, dressed for the day. Pt presenting to be in good spirits receptive to skilled OT session reporting mild head ache,  0/10 pain other wise- OT offering intermittent rest breaks, repositioning, and therapeutic support to optimize participation in therapy session. Pt completed functional mobility to/from therapy gym this session using RW with close SUP-CGA provided for safety and balance- min verbal cues required for safety awareness and attention to avoid obstacles. Engaged Pt in completing series of table top activities to work on in [employer], [facility], and improve overall functional use for ADLs. Pt initially tasked with transporting ping-ping balls from one cup in R hand to cup in L hand to facilitate pronation/supination- graded task by increasing number of ping pong balls with Pt able to transport up to 5 balls without spilling. Pt then tasked with retrieving connect-4 game pieces from table top and placing them into game slots while storing 1-2 pieces in ulnar portion of hand to facilitate isolated pincer grasp- Pt able to complete with min-mod dropping +increased time and verbal cues for translation technique to bring game pieces from palm to  finger tips. While standing on compliant surface, pt competed small peg board picture replication activity for increased dual tasking challenge. She was able to maintain standing balance with CGA overall no UE support for >15 min during activity and place pegs with min dropping and mod verbal cues required to avoid utilizing compensatory pinching patterns. Provided Pt with handout of simple FM activities to complete at home to continue to improve functional use of R UE with Pt receptive to HEP and motivated to complete activities. Pt was left resting in bed with call bell in reach, bed alarm on, and all needs met.    Therapy Documentation Precautions:  Precautions Precautions: Fall, Other (comment) Recall of Precautions/Restrictions: Intact Precaution/Restrictions Comments: watch bp Restrictions Weight Bearing Restrictions Per Provider Order: No  Therapy/Group: Individual Therapy  Katheryn SHAUNNA Mines 11/03/2023, 7:55 AM

## 2023-11-03 NOTE — Progress Notes (Signed)
 Pt having some new expiratory wheezing.  On call Rolan Haggard, PA called, new orders received. Pt went down for chest x ray and received albuterol  Neb treatment from respiratory therapy.  Pt states she is breathing a lot better.  Needs addressed at this time.

## 2023-11-03 NOTE — Progress Notes (Signed)
 Physical Therapy Session Note  Patient Details  Name: Holly Henry MRN: [id number] Date of Birth: 10/28/1955  Today's Date: 11/03/2023 PT Individual Time: 9084-9067 PT Individual Time Calculation (min): 17 min  and Today's Date: 11/03/2023 PT Missed Time: 28 Minutes Missed Time Reason: Pain (Headache)  Short Term Goals: Week 1:  PT Short Term Goal 1 (Week 1): Pt willl complete bed mobility with CGA PT Short Term Goal 2 (Week 1): Pt will complete bed to chair with CGA PT Short Term Goal 3 (Week 1): Pt will ambulate x150' with CGA and LRAD PT Short Term Goal 4 (Week 1): Pt will improve Berg by MCID  Skilled Therapeutic Interventions/Progress Updates:  Patient supine in bed on entrance to room. Patient alert and not initially agreeable to PT session as she has a headache that she has already taken medicine for but is improving marginally. Pt politely declines therapy at this time and again reminded pt that she will have to make the time up at a later time/ date.   She does relate that she usually uses a cold compress to back of head to assist with headache pain.   Therapeutic Activity:   Patient *** at end of session with brakes locked, *** alarm set, and all needs within reach.   Therapy Documentation Precautions:  Precautions Precautions: Fall, Other (comment) Recall of Precautions/Restrictions: Intact Precaution/Restrictions Comments: watch bp Restrictions Weight Bearing Restrictions Per Provider Order: No  Pain: Pain Assessment Pain Scale: 0-10 Pain Score: 5  Pain Location: Head Pain Intervention(s): Medication (See eMAR)   Therapy/Group: Individual Therapy  [name] PT, DPT, CSRS  11/03/2023, 9:25 AM

## 2023-11-03 NOTE — Progress Notes (Signed)
 PROGRESS NOTE   Subjective/Complaints: History of tension type headache has been seen as an outpatient by neurology.  Has been managed with tizanidine .  Gets good relief at night but wakes up with headache in the morning.  She does have pain in the back of her head really nothing at the vertex or frontal  ROS- neg abdominal pain, CP, chills,  N/V/D, shortness of breath Objective:   No results found. Recent Labs    11/03/23 0451  WBC 6.7  HGB 11.6*  HCT 36.8  PLT 235    No results for input(s): NA, K, CL, CO2, GLUCOSE, BUN, CREATININE, CALCIUM  in the last 72 hours.   Intake/Output Summary (Last 24 hours) at 11/03/2023 0852 Last data filed at 11/02/2023 1850 Gross per 24 hour  Intake 480 ml  Output --  Net 480 ml        Physical Exam: Vital Signs Blood pressure (!) 151/97, pulse (!) 105, temperature (!) 97.5 F (36.4 C), resp. rate 18, height 5' 1 (1.549 m), weight 78.1 kg, SpO2 96%.   General: No acute distress Mood and affect are appropriate Heart: Regular rate and rhythm no rubs murmurs or extra sounds Lungs: Clear to auscultation, breathing unlabored, no rales or wheezes Abdomen: Positive bowel sounds, soft nontender to palpation, nondistended Extremities: No clubbing, cyanosis, or edema Skin: No evidence of breakdown, no evidence of rash, scalp is clear mild tenderness just left of midline near vertex Neurologic: Cranial nerves II through XII intact, motor strength is 5/5 in left and 3-/5 right deltoid, bicep, tricep, grip, 5/5 left and 4/5 right hip flexor, knee extensors, ankle dorsiflexor and plantar flexor Sensory exam normal sensation to light touch in bilateral upper and lower extremities Cerebellar exam normal finger to nose to finger Musculoskeletal: MCP PIP and DIP deformities in bilateral hands unable to make fist on left side related to deformities  No tenderness over the cervical  spine or trapezius area  Assessment/Plan: 1. Functional deficits which require 3+ hours per day of interdisciplinary therapy in a comprehensive inpatient rehab setting. Physiatrist is providing close team supervision and 24 hour management of active medical problems listed below. Physiatrist and rehab team continue to assess barriers to discharge/monitor patient progress toward functional and medical goals  Care Tool:  Bathing    Body parts bathed by patient: Right arm, Chest, Abdomen, Front perineal area, Right upper leg, Left upper leg   Body parts bathed by helper: Left arm, Buttocks, Right lower leg, Left lower leg     Bathing assist Assist Level: Moderate Assistance - Patient 50 - 74%     Upper Body Dressing/Undressing Upper body dressing   What is the patient wearing?: Pull over shirt    Upper body assist Assist Level: Moderate Assistance - Patient 50 - 74%    Lower Body Dressing/Undressing Lower body dressing      What is the patient wearing?: Underwear/pull up, Pants     Lower body assist Assist for lower body dressing: Maximal Assistance - Patient 25 - 49%     Toileting Toileting    Toileting assist Assist for toileting: Minimal Assistance - Patient > 75%     Transfers Chair/bed  transfer  Transfers assist     Chair/bed transfer assist level: Minimal Assistance - Patient > 75%     Locomotion Ambulation   Ambulation assist      Assist level: Minimal Assistance - Patient > 75% Assistive device: No Device Max distance: 120'   Walk 10 feet activity   Assist     Assist level: Minimal Assistance - Patient > 75% Assistive device: No Device   Walk 50 feet activity   Assist    Assist level: Minimal Assistance - Patient > 75% Assistive device: No Device    Walk 150 feet activity   Assist Walk 150 feet activity did not occur: Safety/medical concerns         Walk 10 feet on uneven surface  activity   Assist     Assist level:  Minimal Assistance - Patient > 75%     Wheelchair     Assist Is the patient using a wheelchair?: Yes Type of Wheelchair: Manual    Wheelchair assist level: Dependent - Patient 0% Max wheelchair distance: 150'    Wheelchair 50 feet with 2 turns activity    Assist        Assist Level: Dependent - Patient 0%   Wheelchair 150 feet activity     Assist      Assist Level: Dependent - Patient 0%   Blood pressure (!) 151/97, pulse (!) 105, temperature (!) 97.5 F (36.4 C), resp. rate 18, height 5' 1 (1.549 m), weight 78.1 kg, SpO2 96%.  Medical Problem List and Plan: 1. 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  Antithrombotics: -DVT/anticoagulation:  Pharmaceutical: Other (comment) Pradaxa              -antiplatelet therapy: N/A 3. Pain Management: Oxycodone  5 mg every 4 hours as needed,  tramadol  for moderate pain              - Pt reports she was previously discharged from [facility] pain clinic after oxycodone  was not found in UDS Change oxy IR to hydrocodone  5mg  q 6h prn pain >6/10- plan to wean off prior to d/c Use tramadol  50mg  q6h prn pain >3, <7  - Increase gabapentin  to 300 mg 3 times daily  4. Mood/Behavior/Sleep: BuSpar  15 mg twice daily, Cymbalta  90 mg daily, melatonin 5 mg nightly as needed             -antipsychotic agents: N/A 5. Neuropsych/cognition: This patient is capable of making decisions on her own behalf. 6. Skin/Wound Care: Routine skin checks No scalp lesions to correlate with area of mild tenderness to palpation left of vertex, MRI showing no skull or scalp abnormalities  7. Fluids/Electrolytes/Nutrition: Routine in and outs with follow-up chemistries 8.  Hyperlipidemia.  Lipitor /Zetia  9.   GERD.  Protonix  10. Atrial fibrillation.  Continue Pradaxa .  Cardiac rate controlled. But HRs creeping up to 90-100 resume metoprolol , flecainide on hold for now  11.  Prediabetes.  Hemoglobin A1c 6.0.  Currently on a regular diet.  CBGs discontinued 12. Obesity.  Dietary counseling  Body mass index is 31.95 kg/m. 13, Rheumatoid arthritis/Fibromyalgia              -Continue Cymbalta , pain control             -She says was on methotrexate in the past but this was stopped by her rheumatologist about 6 months ago, states she has been on Orencia              -She was on gabapentin  300mg  TID before admission, Restart at lower dose 100mg  TID for now, could consider increasing back up at a later time- see No 3, increase gabapentin  dose 14.  Hypertension.  Resume metoprolol . Vitals:   11/02/23 1933 11/03/23 0511  BP: (!) 166/91 (!) 151/97  Pulse: 94 (!) 105  Resp: 18 18  Temp: 98.2 F (36.8 C) (!) 97.5 F (36.4 C)  SpO2: 96% 96%    15. Constipation, Continue MiraLAX  daily and sorbitol  as needed             - Patient got sorbitol  today, consider additional medication tomorrow if no BM  - 7/21 LBM recorded 7/17, scheduled MiraLAX , also will asked nursing to confirm and will do sorbitol  if this is the case 16. Azotemia, mild enc po fluids f/u BMP today    Latest Ref Rng & Units 10/29/2023    4:40 AM 10/25/2023   10:17 PM 10/25/2023   10:15 PM  BMP  Glucose 70 - 99 mg/dL 887  88  93   BUN 8 - 23 mg/dL 27  18  17    Creatinine 0.44 - 1.00 mg/dL 8.91  8.99  9.07   Sodium 135 - 145 mmol/L 137  141  139   Potassium 3.5 - 5.1 mmol/L 4.1  4.0  4.0   Chloride 98 - 111 mmol/L 104  107  105   CO2 22 - 32 mmol/L 24   24   Calcium  8.9 - 10.3 mg/dL 8.8   8.9     17. Anemia mild, recheck tomorrow        LOS: 6 days A FACE TO FACE EVALUATION WAS PERFORMED  Prentice FORBES Compton 11/03/2023, 8:52 AM

## 2023-11-04 DIAGNOSIS — I482 Chronic atrial fibrillation, unspecified: Secondary | ICD-10-CM | POA: Diagnosis not present

## 2023-11-04 DIAGNOSIS — I639 Cerebral infarction, unspecified: Secondary | ICD-10-CM | POA: Diagnosis not present

## 2023-11-04 DIAGNOSIS — M069 Rheumatoid arthritis, unspecified: Secondary | ICD-10-CM | POA: Diagnosis not present

## 2023-11-04 DIAGNOSIS — G8191 Hemiplegia, unspecified affecting right dominant side: Secondary | ICD-10-CM | POA: Diagnosis not present

## 2023-11-04 NOTE — Consult Note (Addendum)
 WOC Nurse Consult Note: Reason for Consult: Consult requested for buttocks.  Performed remotely after review of progress notes and photos in the EMR.  Buttocks were noted to have a Stage 2 pressure injury which was present on admission to the inner gluteal cleft.  It has remained red and moist and is difficult to promote healing related to the constant moisture and pressure to this location.  Outer bilat buttocks with erythremia to corresponding sites in a circular area of maceration; appearance is consistent with moisture associated skin damage.   Dressing procedure/placement/frequency: Topical treatment ordered for bedside nurses to perform as follows to promote drying and healing: Apply Xeroform gauze to inner gluteal fold Q day, then cover with foam dressing. Change foam dressing Q 3 days or PRN soiling.   Please re-consult if further assistance is needed.  Thank-you,  Stephane Fought MSN, RN, CWOCN, CWCN-AP, CNS Contact Mon-Fri 0700-1500: [phone]

## 2023-11-04 NOTE — Discharge Summary (Signed)
 Physician Discharge Summary  Patient ID: Holly Henry MRN: [id number] DOB/AGE: 1955/12/27 68 y.o.  Admit date: 10/28/2023 Discharge date: 11/18/2023  Discharge Diagnoses:  Principal Problem:   Ischemic cerebrovascular accident (CVA) [facility]) Active Problems:   Major depressive disorder, recurrent episode, moderate (HCC)   GAD (generalized anxiety disorder) A. Fib  Headaches  RA/fibromyalgia  Hypertension  Hyperlipidemia  Prediabetes  Obesity  GERD Constipation  Anemia    Discharged Condition: stable  Significant Diagnostic Studies: DG Chest 2 View Result Date: 11/03/2023 CLINICAL DATA:  Expect Ori wheezing EXAM: CHEST - 2 VIEW COMPARISON:  08/21/2023 FINDINGS: No acute airspace disease or pleural effusion. Stable cardiomediastinal silhouette with aortic atherosclerosis. Chronic right upper rib deformities. No pneumothorax IMPRESSION: No active cardiopulmonary disease. Electronically Signed   By: Luke Bun M.D.   On: 11/03/2023 22:54   ECHOCARDIOGRAM COMPLETE Result Date: 10/26/2023    ECHOCARDIOGRAM REPORT   Patient Name:   Holly Henry Date of Exam: 10/26/2023 Medical Rec #:  [id number]   Height:       61.0 in Accession #:    [id number]  Weight:       165.0 lb Date of Birth:  [birth year]   BSA:          1.740 m Patient Age:    68 years    BP:           176/72 mmHg Patient Gender: F           HR:           62 bpm. Exam Location:  Inpatient Procedure: 2D Echo, 3D Echo, Color Doppler, Cardiac Doppler and Strain Analysis            (Both Spectral and Color Flow Doppler were utilized during            procedure). Indications:     Stroke I63.9  History:         Patient has no prior history of Echocardiogram examinations.                  TIA; Risk Factors:Hypertension and Pre-diabetes.  Sonographer:     Koleen Popper RDCS Referring Phys:  [phone] ERIC LINDZEN Diagnosing Phys: Salena Negri MD  Sonographer Comments: Global longitudinal strain was attempted. IMPRESSIONS  1. Left ventricular ejection  fraction, by estimation, is 65 to 70%. The left ventricle has normal function. The left ventricle has no regional wall motion abnormalities. Left ventricular diastolic parameters are consistent with Grade I diastolic dysfunction (impaired relaxation). The global longitudinal strain is normal.  2. Right ventricular systolic function is normal. The right ventricular size is normal. There is normal pulmonary artery systolic pressure.  3. Left atrial size was mildly dilated.  4. Right atrial size was mildly dilated.  5. The mitral valve is normal in structure. Mild mitral valve regurgitation.  6. The aortic valve is tricuspid. Aortic valve regurgitation is trivial. Aortic valve sclerosis is present, with no evidence of aortic valve stenosis.  7. The inferior vena cava is normal in size with greater than 50% respiratory variability, suggesting right atrial pressure of 3 mmHg. FINDINGS  Left Ventricle: Left ventricular ejection fraction, by estimation, is 65 to 70%. The left ventricle has normal function. The left ventricle has no regional wall motion abnormalities. Strain was performed and the global longitudinal strain is normal. The  left ventricular internal cavity size was normal in size. There is borderline concentric left ventricular hypertrophy. Left ventricular diastolic parameters are consistent with Grade I diastolic  dysfunction (impaired relaxation). Right Ventricle: The right ventricular size is normal. No increase in right ventricular wall thickness. Right ventricular systolic function is normal. There is normal pulmonary artery systolic pressure. The tricuspid regurgitant velocity is 2.78 m/s, and  with an assumed right atrial pressure of 3 mmHg, the estimated right ventricular systolic pressure is 33.9 mmHg. Left Atrium: Left atrial size was mildly dilated. Right Atrium: Right atrial size was mildly dilated. Pericardium: There is no evidence of pericardial effusion. Mitral Valve: The mitral valve is normal  in structure. Mild mitral valve regurgitation. Tricuspid Valve: The tricuspid valve is normal in structure. Tricuspid valve regurgitation is mild. Aortic Valve: The aortic valve is tricuspid. Aortic valve regurgitation is trivial. Aortic valve sclerosis is present, with no evidence of aortic valve stenosis. Pulmonic Valve: The pulmonic valve was normal in structure. Pulmonic valve regurgitation is trivial. Aorta: The aortic root is normal in size and structure. Venous: The inferior vena cava is normal in size with greater than 50% respiratory variability, suggesting right atrial pressure of 3 mmHg. IAS/Shunts: No atrial level shunt detected by color flow Doppler. Additional Comments: 3D was performed not requiring image post processing on an independent workstation and was normal.  LEFT VENTRICLE PLAX 2D LVIDd:         4.20 cm   Diastology LVIDs:         2.50 cm   LV e' medial:    6.74 cm/s LV PW:         1.00 cm   LV E/e' medial:  9.5 LV IVS:        1.10 cm   LV e' lateral:   8.70 cm/s LVOT diam:     1.90 cm   LV E/e' lateral: 7.3 LV SV:         59 LV SV Index:   34 LVOT Area:     2.84 cm                           3D Volume EF:                          3D EF:        65 %                          LV EDV:       109 ml                          LV ESV:       38 ml                          LV SV:        71 ml RIGHT VENTRICLE             IVC RV S prime:     15.10 cm/s  IVC diam: 1.20 cm TAPSE (M-mode): 1.6 cm LEFT ATRIUM             Index LA diam:        4.40 cm 2.53 cm/m LA Vol (A2C):   35.4 ml 20.34 ml/m LA Vol (A4C):   41.1 ml 23.61 ml/m LA Biplane Vol: 38.7 ml 22.24 ml/m  AORTIC VALVE LVOT Vmax:   94.30 cm/s LVOT Vmean:  60.300 cm/s LVOT VTI:  0.209 m  AORTA Ao Root diam: 3.20 cm Ao Asc diam:  3.20 cm MITRAL VALVE               TRICUSPID VALVE MV Area (PHT): 3.53 cm    TR Peak grad:   30.9 mmHg MV Decel Time: 215 msec    TR Vmax:        278.00 cm/s MV E velocity: 63.90 cm/s MV A velocity: 67.10 cm/s  SHUNTS  MV E/A ratio:  0.95        Systemic VTI:  0.21 m                            Systemic Diam: 1.90 cm Salena Negri MD Electronically signed by Salena Negri MD Signature Date/Time: 10/26/2023/6:01:57 PM    Final    MR BRAIN WO CONTRAST Result Date: 10/26/2023 CLINICAL DATA:  Neuro deficit, acute, stroke suspected EXAM: MRI HEAD WITHOUT CONTRAST TECHNIQUE: Multiplanar, multiecho pulse sequences of the brain and surrounding structures were obtained without intravenous contrast. COMPARISON:  CT angiogram of the head dated October 25, 2023 and MRI of the head dated February 22, 2022. FINDINGS: Brain: There is restricted in diffusion involving the left precentral gyrus there is also a small acute subcortical lacunar infarct at the left temporal occipital junction, which is seen on image 69 of series 5. An additional more subtle focus of restricted diffusion is present within the left parietal white matter on image 79. There is age related volume loss and extensive diffuse cerebral white matter disease. There is no evidence of hemorrhage, mass or hydrocephalus. Vascular: Normal vascular flow voids. Skull and upper cervical spine: Normal marrow signal. No osseous lesions. Sinuses/Orbits: Clear paranasal sinuses. Status post bilateral lens replacement. Other: None. IMPRESSION: 1. Acute nonhemorrhagic infarct involving the left precentral gyrus. 2. Small lacunar white matter infarcts involving the left temporal occipital junction and the left parietal lobe. 3. Advanced cerebral white matter disease. Electronically Signed   By: Evalene Coho M.D.   On: 10/26/2023 12:20   CT ANGIO HEAD NECK W WO CM (CODE STROKE) Result Date: 10/25/2023 CLINICAL DATA:  Neuro deficit, acute, stroke suspected EXAM: CT ANGIOGRAPHY HEAD AND NECK WITH AND WITHOUT CONTRAST TECHNIQUE: Multidetector CT imaging of the head and neck was performed using the standard protocol during bolus administration of intravenous contrast. Multiplanar CT image  reconstructions and MIPs were obtained to evaluate the vascular anatomy. Carotid stenosis measurements (when applicable) are obtained utilizing NASCET criteria, using the distal internal carotid diameter as the denominator. RADIATION DOSE REDUCTION: This exam was performed according to the departmental dose-optimization program which includes automated exposure control, adjustment of the mA and/or kV according to patient size and/or use of iterative reconstruction technique. CONTRAST:  75mL OMNIPAQUE  IOHEXOL  350 MG/ML SOLN COMPARISON:  None Available. FINDINGS: CTA NECK FINDINGS Aortic arch: Great vessel origins are patent. Aortic atherosclerosis. Right carotid system: No evidence of dissection, stenosis (50% or greater), or occlusion. Left carotid system: Approximately 50% stenosis of the left proximal ICA due to atherosclerosis. Vertebral arteries: Severe left vertebral artery origin stenosis. Patent bilaterally. Right dominant. Skeleton: No evidence of acute abnormality on limited assessment. Other neck: No evidence of acute abnormality on limited assessment. Upper chest: Visualized lung apices are clear. Review of the MIP images confirms the above findings CTA HEAD FINDINGS Anterior circulation: Bilateral intracranial ICAs, MCAs, and are patent without proximal hemodynamically significant stenosis. Posterior circulation: Bilateral intradural vertebral arteries, basilar artery and  bilateral posterior arteries are patent without proximal 8 image least significant stenosis. Venous sinuses: As permitted by contrast timing, patent. Review of the MIP images confirms the above findings IMPRESSION: 1. No emergent large vessel occlusion. 2. Severe left vertebral artery origin stenosis. 3. 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 4. Aortic Atherosclerosis (ICD10-I70.0). Electronically Signed   By: Gilmore GORMAN Molt M.D.   On: 10/25/2023 22:53   CT HEAD CODE STROKE WO CONTRAST Result Date:  10/25/2023 CLINICAL DATA:  Code stroke.  Neuro deficit, acute, stroke suspected EXAM: CT HEAD WITHOUT CONTRAST TECHNIQUE: Contiguous axial images were obtained from the base of the skull through the vertex without intravenous contrast. RADIATION DOSE REDUCTION: This exam was performed according to the departmental dose-optimization program which includes automated exposure control, adjustment of the mA and/or kV according to patient size and/or use of iterative reconstruction technique. COMPARISON:  CT headMay 9, 2025. FINDINGS: Brain: Similar patchy hypodensities in the white matter and deep gray nuclei, compatible with chronic microvascular ischemic change. Small remote infarct in the left caudate superiorly. No evidence of acute large vascular territory infarct, acute hemorrhage, mass lesion or midline shift. Vascular: No hyperdense vessel. Skull: No acute fracture. Sinuses/Orbits: Clear sinuses.  No acute orbital findings. Other: No mastoid effusions. ASPECTS [facility] Stroke Program Early CT Score) Total score (0-10 with 10 being normal): 10. IMPRESSION: 1. No evidence of acute intracranial abnormality. ASPECTS is 10. 2. Chronic microvascular ischemic change. Code stroke imaging results were communicated on 10/25/2023 at 10:34 pm to provider Dr. Merrianne via secure text paging. Electronically Signed   By: Gilmore GORMAN Molt M.D.   On: 10/25/2023 22:35    Labs:  Basic Metabolic Panel: No results for input(s): NA, K, CL, CO2, GLUCOSE, BUN, CREATININE, CALCIUM , MG, PHOS in the last 168 hours.   CBC: No results for input(s): WBC, NEUTROABS, HGB, HCT, MCV, PLT in the last 168 hours.   CBG: No results for input(s): GLUCAP in the last 168 hours.   Brief HPI:   Shakevia Sarris is a 68 y.o. female right-handed female with history significant for paroxysmal atrial fibrillation on Eliquis , hypertension, hyperlipidemia, rheumatoid arthritis, GERD, IBS, anxiety/depression maintained  on BuSpar , prior TIA/CVA, prediabetes. Per chart review patient lives with daughter and grandchildren. Independent prior to admission but did have occasional fall over the last 6 months. To level home, laundry/work area and basement. Patient able to stay on main level. One-step to entry. Presented 10/25/2023 with acute onset of right sided weakness and sensory loss. Blood pressure 150/106 per EMS. Cranial CT scan negative for acute changes. Chronic microvascular ischemic changes. CTA showed no emergent large vessel occlusion. Patient did not receive TNK. MRI of the brain showed acute nonhemorrhagic infarct involving the left precentral gyrus. Small lacunar white matter infarct involving the left temporal occipital junction and the left parietal lobe. Admission chemistries unremarkable, hemoglobin A1c 6.0. Echocardiogram with ejection fraction of 65 to 70% no wall motion abnormalities grade 1 diastolic dysfunction. Neurology follow-up felt left frontal, temporoparietal infarct secondary to atrial fibrillation while on OAC likely Eliquis  failure transition to Pradaxa . Tolerating a regular consistency diet. Patient reports she was previously on methotrexate for rheumatoid arthritis but this was discontinued due to possible side effects about 6 months ago. Therapy evaluations completed due to patient decreased functional ability right side weakness was admitted for a comprehensive rehab program.   Hospital Course: Yomira Flitton was admitted to rehab 10/28/2023 for inpatient therapies to consist of PT, ST and OT at least three  hours five days a week. Past admission physiatrist, therapy team and rehab RN have worked together to provide customized collaborative inpatient rehab. The patient was admitted for a left frontal infarction thought to be due to Eliquis  failure. Neurology recommended the use of Pradaxa  for ongoing management.  The patient experienced headaches, which improved with the addition of topiramate . She has a  remote history of using Imitrex , which is contraindicated due to her CVA. She has hyperlipidemia and continued taking Lipitor  and Zetia . For GERD, she was prescribed Protonix . She has prediabetes with an A1C of 6.0, and her blood sugars were initially monitored routinely but were later discontinued as they stabilized. Dietary counseling was provided for obesity. She has a history of fibromyalgia and rheumatoid arthritis which was managed with multimodal pain control methods. Previously prescribed hydrocodone  and tramadol  were discontinued in favor of Tylenol  and NSAIDs as needed due to a prior discharge from a pain clinic, and gabapentin  was increased.Cymbalta  continued. Methotrexate was stopped by her rheumatologist six months ago, and she is now on Rinvoq. She will follow up with rheumatology. History of a.fib on metoprolol  and flecainide, flecainide held during admission. The patient has a history of hypertension, and metoprolol  was increased showing improvement in heart rate ranges.  Blood pressures were monitored on TID basis and her blood pressure remained mildly elevated, she will need follow-up with her cardiologist. She experienced constipation, which resolved with a bowel program. Azotemia resolved with improved oral fluid intake. Anemia was mild and stable upon recheck.     Rehab course: During patient's stay in rehab weekly team conferences were held to monitor patient's progress, set goals and discuss barriers to discharge. At admission, patient required mod I to supervision.  She met long-term goals and functional use of right upper extremity and right lower extremity and will discharge at ambulatory level supervision.  PT services are recommended outpatient to continue to advance a functional mobility, address ongoing impairments in strength, coordination, balance, activity, tolerance, cognition, safety awareness and to minimize fall risk.  With OT all goals were met and she will discharge at  overall modified independent level for BADL list and supervision for shower transfers and higher level IADLs. Family teaching completed with patient's daughter upon discharge to home.      Disposition:  Discharge disposition: 01-Home or Self Care        Diet:  Special Instructions:  -No driving smoking or alcohol or illicit drug use    Discharge Instructions     Ambulatory referral to Neurology   Complete by: As directed    An appointment is requested in approximately: 8 weeks   Ambulatory referral to Occupational Therapy   Complete by: As directed    Eval and treat   Ambulatory referral to Physical Medicine Rehab   Complete by: As directed    Ambulatory referral to Physical Therapy   Complete by: As directed       Allergies as of 11/11/2023       Reactions   Gramineae Pollens Dermatitis   Dust, pollen redtop grass pollen extract   [place] Grass Extract Cough   Buprenorphine Hcl Other (See Comments)   Other Reaction(s): Mental Status Changes, Psychosis   Pollen Extract Dermatitis   Dust, pollen        Medication List     PAUSE taking these medications    flecainide 50 MG tablet Wait to take this until your doctor or other care provider tells you to start again. Commonly known as:  TAMBOCOR Take 50 mg by mouth 2 (two) times daily.       STOP taking these medications    apixaban  5 MG Tabs tablet Commonly known as: ELIQUIS    methenamine  1 g tablet Commonly known as: HIPREX   metoprolol  tartrate 25 MG tablet Commonly known as: LOPRESSOR        TAKE these medications    acetaminophen  325 MG tablet Commonly known as: TYLENOL  Take 2 tablets (650 mg total) by mouth every 6 (six) hours as needed for mild pain (pain score 1-3) or fever. What changed:  medication strength how much to take reasons to take this   ascorbic acid  500 MG tablet Commonly known as: VITAMIN C  Take 500 mg by mouth 2 (two) times daily.   aspirin EC 81 MG tablet Take  81 mg by mouth at bedtime.   atorvastatin  80 MG tablet Commonly known as: LIPITOR  Take 80 mg by mouth at bedtime.   busPIRone  15 MG tablet Commonly known as: BUSPAR  Take 15 mg by mouth 2 (two) times daily.   dabigatran  150 MG Caps capsule Commonly known as: PRADAXA  Take 1 capsule (150 mg total) by mouth 2 (two) times daily. What changed: when to take this   diclofenac Sodium 1 % Gel Commonly known as: VOLTAREN Apply 1 application topically 2 (two) times daily as needed (arthritis pain).   DULoxetine  30 MG capsule Commonly known as: CYMBALTA  Take 3 capsules (90 mg total) by mouth daily.   ezetimibe  10 MG tablet Commonly known as: ZETIA  Take 10 mg by mouth in the morning.   furosemide  20 MG tablet Commonly known as: LASIX  Take 1 tablet (20 mg total) by mouth daily. What changed:  when to take this reasons to take this   gabapentin  300 MG capsule Commonly known as: NEURONTIN  Take 300 mg by mouth 3 (three) times daily.   hydroxypropyl methylcellulose / hypromellose 2.5 % ophthalmic solution Commonly known as: ISOPTO TEARS / GONIOVISC Place 1 drop into both eyes 3 (three) times daily as needed for dry eyes.   latanoprost  0.005 % ophthalmic solution Commonly known as: XALATAN  Place 1 drop into both eyes at bedtime.   Lumigan 0.01 % Soln Generic drug: bimatoprost Place 1 drop into both eyes at bedtime.   magnesium oxide 400 (240 Mg) MG tablet Commonly known as: MAG-OX Take 400 mg by mouth at bedtime.   melatonin 5 MG Tabs Take 1 tablet (5 mg total) by mouth at bedtime as needed.   methenamine  0.5 GM tablet Commonly known as: MANDELAMINE Take 1 tablet (500 mg total) by mouth 2 (two) times daily.   metoprolol  succinate 50 MG 24 hr tablet Commonly known as: TOPROL -XL Take 1 tablet (50 mg total) by mouth daily. Take with or immediately following a meal.   multivitamin with minerals Tabs tablet Take 1 tablet by mouth daily.   ondansetron  4 MG tablet Commonly  known as: ZOFRAN  Take 4 mg by mouth every 8 (eight) hours as needed for nausea or vomiting.   Orencia ClickJect 125 MG/ML Soaj Generic drug: Abatacept Inject 125 mg into the skin once a week. Inject subcutaneously on Sunday.   pantoprazole  40 MG tablet Commonly known as: PROTONIX  Take 40 mg by mouth every morning.   polyethylene glycol powder 17 GM/SCOOP powder Commonly known as: GLYCOLAX /MIRALAX  Take 17 g by mouth daily.   Repatha SureClick 140 MG/ML Soaj Generic drug: Evolocumab Inject 140 mg into the skin every 14 (fourteen) days. Inject subcutaneously every 14 days.   tiZANidine   2 MG tablet Commonly known as: ZANAFLEX  Take 2 tablets (4 mg total) by mouth at bedtime. May also take 1 tablet (2 mg total) every 8 (eight) hours as needed for muscle spasms. What changed: See the new instructions.   topiramate  25 MG tablet Commonly known as: TOPAMAX  Take 1 tablet (25 mg total) by mouth 2 (two) times daily.   triamcinolone  cream 0.1 % Commonly known as: KENALOG  Apply topically daily.   Ventolin  HFA 108 (90 Base) MCG/ACT inhaler Generic drug: albuterol  Inhale 1-2 puffs into the lungs every 4 (four) hours as needed for wheezing or shortness of breath (or cough).        Follow-up Information     Gretel Piedra, NP Follow up.   Specialty: Internal Medicine Why: Call for an appointment. Contact information: [address] [place] [address] [phone]         Epifanio Alm HERO, MD Follow up.   Specialty: Cardiology Why: Call for an appointment Contact information: [address] [place] [address] [phone]         [facility] Follow up.   Specialty: Neurology Why: Call for an appointment. Contact information: [address] [place]  [address] [phone]        Carilyn Prentice BRAVO, MD Follow up.   Specialty: Physical Medicine and Rehabilitation Why: Call for  an appointment. Contact information: [address] [place] [address] [phone]                 Signed: Daphne LITTIE Finders 11/18/2023, 10:16 PM

## 2023-11-04 NOTE — Progress Notes (Signed)
 Occupational Therapy Session Note  Patient Details  Name: Holly Henry MRN: [id number] Date of Birth: 13-Oct-1955  Today's Date: 11/04/2023 OT Individual Time: 1050-1205 OT Individual Time Calculation (min): 75 min    Short Term Goals: Week 1:  OT Short Term Goal 1 (Week 1): Pt will recall hemidressing techniques with min questioning cues OT Short Term Goal 2 (Week 1): Pt will maintain R UE in safe position during functional transfers, ADLs, and when sitting in wc with min verbal cues OT Short Term Goal 3 (Week 1): Pt will complete LB dresing with min A using AE and LRAD PRN OT Short Term Goal 4 (Week 1): Pt will complete toilet transfers with CGA using LRAD  Skilled Therapeutic Interventions/Progress Updates:   Pt greeted supine in bed, pt agreeable to OT intervention.      Transfers/bed mobility/functional mobility:  Pt completed supine>sit with supervision. Stand pivot to w/c with no AD and CGA.   Therapeutic activity:  Pt completed various seated FMC tasks with a focus on improved RUE motor planning and intrinsic strength. Pt instructed to grasp pegs with RUE and place matching pegs in pop it tray. Pt completed task with supervision, graded task up and had pt remove pegs from pop it with tweezers, pt completed task with supervision.   Pt completed seated lacing task with RUE with a focus on bilateral coordination and FMC. Pt completed task with increased time but overall supervision.   Pt completed seated handwriting task with RUE with pt given built up foam on pen with pt instructed to complete several tracing tasks first with good feedback. Pt then practiced writing her name on lined paper with built up pen.   Pt completed simulated cooking task with pt instructed to use spatula to flip over discs to simulate cooking task.pt completed task sitting with supervision and + time. Graded task up and had pt stand to complete same task but this time with bean bags to provide more weight.    Pt instructed to use hand exerciser on level 1 setting to grasp Leggos with a focus on improving intrinsic strength in affected UE. Pt completed task with + time but overall supervision.   Ended session with pt seated in w/c with all needs within reach.                  Therapy Documentation Precautions:  Precautions Precautions: Fall, Other (comment) Recall of Precautions/Restrictions: Intact Precaution/Restrictions Comments: watch bp Restrictions Weight Bearing Restrictions Per Provider Order: No  Pain: No pain    Therapy/Group: Individual Therapy  Ronal Gift [facility] 11/04/2023, 12:23 PM

## 2023-11-04 NOTE — Progress Notes (Signed)
 Occupational Therapy Session Note  Patient Details  Name: Holly Henry MRN: [id number] Date of Birth: 04/29/1955  Today's Date: 11/04/2023 OT Individual Time: 9086-9041 OT Individual Time Calculation (min): 45 min  OT Individual Time: 1502-1530 OT Individual Time Calculation (min): 28 min   Short Term Goals: Week 1:  OT Short Term Goal 1 (Week 1): Pt will recall hemidressing techniques with min questioning cues OT Short Term Goal 2 (Week 1): Pt will maintain R UE in safe position during functional transfers, ADLs, and when sitting in wc with min verbal cues OT Short Term Goal 3 (Week 1): Pt will complete LB dresing with min A using AE and LRAD PRN OT Short Term Goal 4 (Week 1): Pt will complete toilet transfers with CGA using LRAD  Skilled Therapeutic Interventions/Progress Updates:     AM Session:  Pt received sitting up in recliner dressed for the day upon OT arrival declining need for shower this session. Pt presenting to be in good spirits receptive to skilled OT session reporting 4/10 pain in neck/shoulders and d/t headache- OT offering intermittent rest breaks, repositioning, and therapeutic support to optimize participation in therapy session. Focused this session on dynamic balance, R UE functional use, and pain management.   Pt completed functional mobility to/from therapy gym this session using RW with close SUP-CGA provided for safety, no LOB and min verbal cues required to maintain R grasp on RW handle. When sitting to EOM and standing to RW, verbal cues required for hand placement. Provided education on sit<>stand technique and safety when using RW with emphasis on pushing up from surface she is standing from and reaching back prior to sitting with Pt receptive to education.   While standing on complaint surface, Pt completed dynamic standing balance functional reaching task with maintained grasp, voluntary release, and pincer grasp faciliated during activity. Pt requiring  increased effort to doff squigz from window seal and place them onto window using R UE, however able to complete without dropping. Pt then utilized R hand to place rings over squigz using pincer grasp- mod use of compensatory pinch pattern noted. Pt with improved wrist extension noted. Pt then doffed rings/squigz with min use of compensatory techniques and no LOB.   Applied heat pack to Pt's shoulders/upper back to decrease muscle stiffness, promote improved ROM, and decrease pain. With B UEs supported on larges yoga ball, engaged Pt in rolling ball forwards/backwards moving through trunk flexion/extension in combination with shoulder flexion to decrease muscle stiffness with noted improvement following. Pt then completed trunk rotation chest opener stretch while OT guided Pt through deep breathing. Pt expressing she found the stretches enjoyable and that she felt less tension in her neck/back following.   Pt was left resting in wc with call bell in reach, and all needs met.    PM Session:  Pt received sitting up in wc with DTR present in room. Pt presenting to be in good spirits receptive to skilled OT session reporting 0/10 pain- OT offering intermittent rest breaks, repositioning, and therapeutic support to optimize participation in therapy session. Transported Pt total A to therapy gym in wc for time management and energy conservation. Engaged Pt in dynamic standing balance dual tasking towel folding activity to work on dynamic balance and B UE reciprocal arm use. Pt instructed to complete tri-fold of large towels and progressing towards smaller towels to grade up task and then place them in a pile using R UE. Pt initially demonstrating increased challenges with aligning sides of towels,  however with increased time and practice Pt with noted improved accuracy. Pt then completed functional mobility back to her room no AD for endurance training and to work on R LE placement with CGA provided for balance. Pt  was left resting in bed with call bell in reach, bed alarm on, and all needs met.    Therapy Documentation Precautions:  Precautions Precautions: Fall, Other (comment) Recall of Precautions/Restrictions: Intact Precaution/Restrictions Comments: watch bp Restrictions Weight Bearing Restrictions Per Provider Order: No  Therapy/Group: Individual Therapy  Katheryn SHAUNNA Mines 11/04/2023, 7:49 AM

## 2023-11-04 NOTE — Patient Care Conference (Signed)
 Inpatient RehabilitationTeam Conference and Plan of Care Update Date: 11/04/2023   Time: 1045 am    Patient Name: Holly Henry      Medical Record Number: [id number]  Date of Birth: 07-20-1955 Sex: Female         Room/Bed: 4W06C/4W06C-01 Payor Info: Payor: [employer] MEDICARE / Plan: [employer] MEDICARE CHOICE PPO / Product Type: *No Product type* /    Admit Date/Time:  10/28/2023  5:58 PM  Primary Diagnosis:  Ischemic cerebrovascular accident (CVA) [facility])  Hospital Problems: Principal Problem:   Ischemic cerebrovascular accident (CVA) (HCC) Active Problems:   Major depressive disorder, recurrent episode, moderate (HCC)   GAD (generalized anxiety disorder)    Expected Discharge Date: Expected Discharge Date: 11/11/23  Team Members Present: Physician leading conference: Dr. Prentice Compton Social Worker Present: Other (comment) Versa Ronde RN) Nurse Present: Eulalio Falls, RN PT Present: Recardo Milliner, PT OT Present: Katheryn Mines, OT SLP Present: Recardo Mole, SLP PPS Coordinator present : Eleanor Colon, SLP     Current Status/Progress Goal Weekly Team Focus  Bowel/Bladder   pt continent x2 LBM 7/22   remain cont x2   Assess toileting needs q4 hours and prn    Swallow/Nutrition/ Hydration               ADL's   UB ADLs SUP using hemi-techniques, LB ADLs CGA using hemi-techniques, ambulatory transfers with RW close SUP-CGA, improved functional use of R UE- decreased digit extension/flexion of digits 3-5   SUP overall   R NMRE, dynamic balance, Pt education, activity tolerance, ADL retraining    Mobility   Bed mobility = supervision, Transfers = CGA for balance, Ambulation = CGA using no AD for >200' but requires cueing for full RLE clearance   SUP overall  Barriers: R sided weakness, chronic OA/ RA pain /// Work on: R hemibody NMR, dynamic balance, safety awareness, family education, general stengthening    Communication                Safety/Cognition/ Behavioral  Observations               Pain   pt c/o headaches and pain in BLE/ knees. relieved by prns   Manage pain < 3 with prns   Pain assessment Q shift and PRN    Skin   Pt has a stage 1 on her bottom, treating with Kenalog  cream ad Nystatin  powder.   Skin remains free of infection  Skin assessment q shift and prn      Discharge Planning:  Discharge home w dtr and three grandchildren. Dtr not working (application for disability for self health issues). Anticipate DME needs and follow up services.    Team Discussion: Patient admitted post left frontal, temporoparietal infarction likely due to atrial fibrillation.  Patient with Rheumatoid arthritis/Fibromyalgia. Patient limited by pain and right sided weakness  Patient on target to meet rehab goals: yes,  currently patient requires CGA with upper body care and lower body care. Patient transfers with intermittent CGA without a rolling walker. Patient ambulates with CGA >200' with no assistive device but requires cueing. Overall goals at discharge are set for supervision assistance.   *See Care Plan and progress notes for long and short-term goals.   Revisions to Treatment Plan:  WOC consult   Teaching Needs: Safety, medications, transfers, toileting, etc.    Current Barriers to Discharge: Decreased caregiver support, Home enviroment access/layout, and Wound care  Possible Resolutions to Barriers: Family Education     Medical  Summary Current Status: Occ HA pain , hx of TTHA, also has RA related pain in kne after walkking with PT  Barriers to Discharge: Morbid Obesity;Medical stability   Possible Resolutions to [employer] Focus: con't medication adjustments for multiple pain issues   Continued Need for Acute Rehabilitation Level of Care: The patient requires daily medical management by a physician with specialized training in physical medicine and rehabilitation for the following reasons: Direction of a multidisciplinary  physical rehabilitation program to maximize functional independence : Yes Medical management of patient stability for increased activity during participation in an intensive rehabilitation regime.: Yes Analysis of laboratory values and/or radiology reports with any subsequent need for medication adjustment and/or medical intervention. : Yes   I attest that I was present, lead the team conference, and concur with the assessment and plan of the team.   Rohini Jaroszewski Gayo 11/04/2023, 1045 am

## 2023-11-04 NOTE — Progress Notes (Signed)
 PROGRESS NOTE   Subjective/Complaints: Amb > 100' with therapy , occ HA pain , also has B Knee pain due to RA after walking with PT, pt takes tramadol  for this   Reviewed CXR results neg  ROS- neg abdominal pain, CP, chills,  N/V/D, shortness of breath Objective:   DG Chest 2 View Result Date: 11/03/2023 CLINICAL DATA:  Expect Ori wheezing EXAM: CHEST - 2 VIEW COMPARISON:  08/21/2023 FINDINGS: No acute airspace disease or pleural effusion. Stable cardiomediastinal silhouette with aortic atherosclerosis. Chronic right upper rib deformities. No pneumothorax IMPRESSION: No active cardiopulmonary disease. Electronically Signed   By: Luke Bun M.D.   On: 11/03/2023 22:54   Recent Labs    11/03/23 0451  WBC 6.7  HGB 11.6*  HCT 36.8  PLT 235    Recent Labs    11/03/23 0912  NA 135  K 4.2  CL 101  CO2 25  GLUCOSE 142*  BUN 20  CREATININE 0.80  CALCIUM  9.1     Intake/Output Summary (Last 24 hours) at 11/04/2023 0854 Last data filed at 11/04/2023 0839 Gross per 24 hour  Intake 840 ml  Output --  Net 840 ml        Physical Exam: Vital Signs Blood pressure 131/73, pulse 80, temperature 98.1 F (36.7 C), resp. rate 15, height 5' 1 (1.549 m), weight 78.1 kg, SpO2 95%.   General: No acute distress Mood and affect are appropriate Heart: Regular rate and rhythm no rubs murmurs or extra sounds Lungs: Clear to auscultation, breathing unlabored, no rales or wheezes Abdomen: Positive bowel sounds, soft nontender to palpation, nondistended Extremities: No clubbing, cyanosis, or edema Skin: No evidence of breakdown, no evidence of rash, scalp is clear mild tenderness just left of midline near vertex Neurologic: Cranial nerves II through XII intact, motor strength is 5/5 in left and 3-/5 right deltoid, bicep, tricep, grip, 5/5 left and 4/5 right hip flexor, knee extensors, ankle dorsiflexor and plantar flexor Sensory  exam normal sensation to light touch in bilateral upper and lower extremities Cerebellar exam normal finger to nose to finger Musculoskeletal: MCP PIP and DIP deformities in bilateral hands unable to make fist on left side related to deformities  No tenderness over the cervical spine or trapezius area  Assessment/Plan: 1. Functional deficits which require 3+ hours per day of interdisciplinary therapy in a comprehensive inpatient rehab setting. Physiatrist is providing close team supervision and 24 hour management of active medical problems listed below. Physiatrist and rehab team continue to assess barriers to discharge/monitor patient progress toward functional and medical goals  Care Tool:  Bathing    Body parts bathed by patient: Right arm, Chest, Abdomen, Front perineal area, Right upper leg, Left upper leg   Body parts bathed by helper: Left arm, Buttocks, Right lower leg, Left lower leg     Bathing assist Assist Level: Moderate Assistance - Patient 50 - 74%     Upper Body Dressing/Undressing Upper body dressing   What is the patient wearing?: Pull over shirt    Upper body assist Assist Level: Moderate Assistance - Patient 50 - 74%    Lower Body Dressing/Undressing Lower body dressing  What is the patient wearing?: Underwear/pull up, Pants     Lower body assist Assist for lower body dressing: Maximal Assistance - Patient 25 - 49%     Toileting Toileting    Toileting assist Assist for toileting: Minimal Assistance - Patient > 75%     Transfers Chair/bed transfer  Transfers assist     Chair/bed transfer assist level: Supervision/Verbal cueing     Locomotion Ambulation   Ambulation assist      Assist level: Supervision/Verbal cueing Assistive device: Walker-rolling Max distance: 250   Walk 10 feet activity   Assist     Assist level: Minimal Assistance - Patient > 75% Assistive device: No Device   Walk 50 feet activity   Assist     Assist level: Minimal Assistance - Patient > 75% Assistive device: No Device    Walk 150 feet activity   Assist Walk 150 feet activity did not occur: Safety/medical concerns         Walk 10 feet on uneven surface  activity   Assist     Assist level: Minimal Assistance - Patient > 75%     Wheelchair     Assist Is the patient using a wheelchair?: Yes Type of Wheelchair: Manual    Wheelchair assist level: Dependent - Patient 0% Max wheelchair distance: 150'    Wheelchair 50 feet with 2 turns activity    Assist        Assist Level: Dependent - Patient 0%   Wheelchair 150 feet activity     Assist      Assist Level: Dependent - Patient 0%   Blood pressure 131/73, pulse 80, temperature 98.1 F (36.7 C), resp. rate 15, height 5' 1 (1.549 m), weight 78.1 kg, SpO2 95%.  Medical Problem List and Plan: 1. Functional deficits secondary to left frontal, temporoparietal infarction likely due to atrial fibrillation while on OAC likely due to Eliquis  failure             -patient may shower             -ELOS/Goals: 7 to 10 days, mod I to supervision with PT and OT             -continue CIR PT,OT,SLP  Team conference today please see physician documentation under team conference tab, met with team  to discuss problems,progress, and goals. Formulized individual treatment plan based on medical history, underlying problem and comorbidities.  2.  Antithrombotics: -DVT/anticoagulation:  Pharmaceutical: Other (comment) Pradaxa              -antiplatelet therapy: N/A 3. Pain Management: Oxycodone  5 mg every 4 hours as needed,  tramadol  for moderate pain              - Pt reports she was previously discharged from [facility] clinic after oxycodone  was not found in UDS Change oxy IR to hydrocodone  5mg  q 6h prn pain >6/10- plan to wean off prior to d/c Use tramadol  50mg  q6h prn pain >3, <7  - Increase gabapentin  to 300 mg 3 times daily  4. Mood/Behavior/Sleep:  BuSpar  15 mg twice daily, Cymbalta  90 mg daily, melatonin 5 mg nightly as needed             -antipsychotic agents: N/A 5. Neuropsych/cognition: This patient is capable of making decisions on her own behalf. 6. Skin/Wound Care: Routine skin checks No scalp lesions to correlate with area of mild tenderness to palpation left of vertex, MRI showing no skull or scalp abnormalities  7. Fluids/Electrolytes/Nutrition: Routine in and outs with follow-up chemistries 8.  Hyperlipidemia.  Lipitor /Zetia  9.  GERD.  Protonix  10. Atrial fibrillation.  Continue Pradaxa .  Cardiac rate controlled. But HRs creeping up to 90-100 resume metoprolol , flecainide on hold for now  11.  Prediabetes.  Hemoglobin A1c 6.0.  Currently on a regular diet.  CBGs discontinued 12. Obesity.  Dietary counseling             Body mass index is 31.95 kg/m. 13, Rheumatoid arthritis/Fibromyalgia              -Continue Cymbalta , pain control             -She says was on methotrexate in the past but this was stopped by her rheumatologist about 6 months ago, states she has been on Orencia              -She was on gabapentin  300mg  TID before admission, Restart at lower dose 100mg  TID for now, could consider increasing back up at a later time- see No 3, increase gabapentin  dose 14.  Hypertension.  Resume metoprolol .Improved HR 76-80bpm  Vitals:   11/03/23 2028 11/04/23 0441  BP: (!) 140/84 131/73  Pulse: 76 80  Resp: 16 15  Temp: 97.7 F (36.5 C) 98.1 F (36.7 C)  SpO2: 96% 95%    15. Constipation, Continue MiraLAX  daily and sorbitol  as needed             - Patient got sorbitol  today, consider additional medication tomorrow if no BM  - 7/21 LBM recorded 7/17, scheduled MiraLAX , also will asked nursing to confirm and will do sorbitol  if this is the case 16. Azotemia, mild enc po fluids f/u BMP today    Latest Ref Rng & Units 11/03/2023    9:12 AM 10/29/2023    4:40 AM 10/25/2023   10:17 PM  BMP  Glucose 70 - 99 mg/dL 857  887   88   BUN 8 - 23 mg/dL 20  27  18    Creatinine 0.44 - 1.00 mg/dL 9.19  8.91  8.99   Sodium 135 - 145 mmol/L 135  137  141   Potassium 3.5 - 5.1 mmol/L 4.2  4.1  4.0   Chloride 98 - 111 mmol/L 101  104  107   CO2 22 - 32 mmol/L 25  24    Calcium  8.9 - 10.3 mg/dL 9.1  8.8      17. Anemia mild, recheck stable     Latest Ref Rng & Units 11/03/2023    4:51 AM 10/29/2023    4:40 AM 10/25/2023   10:17 PM  CBC  WBC 4.0 - 10.5 K/uL 6.7  7.8    Hemoglobin 12.0 - 15.0 g/dL 88.3  88.3  87.7   Hematocrit 36.0 - 46.0 % 36.8  36.1  36.0   Platelets 150 - 400 K/uL 235  245             LOS: 7 days A FACE TO FACE EVALUATION WAS PERFORMED  Prentice FORBES Compton 11/04/2023, 8:54 AM

## 2023-11-05 NOTE — Progress Notes (Signed)
 Patient ID: Holly Henry, female   DOB: 05/17/1955, 68 y.o.   MRN: [id number]  This SW covering for primary SW Becky Dupree.  SW ordered rollator and shower seat with back with [employer] via parachute.   Graeme Jude, MSW, LCSW Office: [phone] Cell: [phone] Fax: [phone]

## 2023-11-05 NOTE — Progress Notes (Signed)
 Physical Therapy Session Note  Patient Details  Name: Holly Henry MRN: [id number] Date of Birth: August 06, 1955  Today's Date: 11/04/2023 PT Individual Time: 0803-0903 PT Individual Time Calculation (min): 60 min  Short Term Goals: Week 1:  PT Short Term Goal 1 (Week 1): Pt willl complete bed mobility with CGA PT Short Term Goal 2 (Week 1): Pt will complete bed to chair with CGA PT Short Term Goal 3 (Week 1): Pt will ambulate x150' with CGA and LRAD PT Short Term Goal 4 (Week 1): Pt will improve Berg by MCID  Skilled Therapeutic Interventions/Progress Updates:  Patient supine in bed and asleep on entrance to room. Patient roused easily and quickly alert and agreeable to PT session.   Patient with no pain complaint at start of session.  Does relate need to change clothes and toilet.   Therapeutic Activity: Bed Mobility: Pt performed supine > sit with supervision/ Mod I. VC/ tc required for minimal safety awareness. Transfers: Pt performed sit<>stand and stand pivot transfers throughout session with SBA/ supervision. Provided vc/ tc for technique and balance.  Ambulatory transfer into bathroom with use of RW and light CGA to maneuver up ramp into bathroom otherwise SBA.   Gait Training:  Pt ambulated >150' x1 using RW with close supervision. Then able to ambulate shorter distances during session as well as return trip to room with no AD and light CGA.  Demonstrated improving step advancement but continued low but adequate step height. Challenged with changes in gait speed on return trip. Pt does demo one stumble at start of increasing speed requiring MinA to correct. Provided vc/ tc for maintaining safety awareness with increased focus to RLE during all mobility.  Neuromuscular Re-ed: NMR facilitated during session with focus on R hemibody motor control, increased muscle activation and dynamic standing balance. Pt guided in need for lateral step out to R in order to reach green clothes pins,  return to start positioning and then reach out to put pin on basketball goal at head height to pt's L side. Improves in quality of movement throughout with increasing step out to R and use of RLE to push back into upright stance. Uses body to reposition pin in hand prior to pinch. Is unable to use thumb for pinch of pin but is able to use thenar eminence with fire- and middle fingers to pinch open. Is also able to collect from net and return to far end of tray table. No LOB throughout. Provided with CGA/ close supervision.   NMR performed for improvements in motor control and coordination, balance, sequencing, judgement, and self confidence/ efficacy in performing all aspects of mobility at highest level of independence.   Guided pt in stretch to lower back, latissimus, and trapezius with use of t-ball and rolling it anteriorly as well as to diagonals. Related how to perform with use of ottoman at home. Also guided pt in stretch to scalenes to each side of neck in order to improve pt's pain with some movements and improve cervical ROM. Potential use to improve headache complaints.   Patient seated upright in w/c at end of session with brakes locked, belt alarm set, and all needs within reach.   Therapy Documentation Precautions:  Precautions Precautions: Fall, Other (comment) Recall of Precautions/Restrictions: Intact Precaution/Restrictions Comments: watch bp Restrictions Weight Bearing Restrictions Per Provider Order: No  Pain:  No pain complaint throughout session.   Therapy/Group: Individual Therapy  [name] PT, DPT, CSRS 11/05/2023, 6:51 AM

## 2023-11-05 NOTE — Progress Notes (Signed)
 Occupational Therapy Session Note  Patient Details  Name: Holly Henry MRN: [id number] Date of Birth: May 11, 1955  Today's Date: 11/05/2023 OT Individual Time: 8897-8840 OT Individual Time Calculation (min): 57 min  Session 2:  OT Individual Time: 8897-8840 OT Individual Time Calculation (min): 57 min   Short Term Goals: Week 1:  OT Short Term Goal 1 (Week 1): Pt will recall hemidressing techniques with min questioning cues OT Short Term Goal 2 (Week 1): Pt will maintain R UE in safe position during functional transfers, ADLs, and when sitting in wc with min verbal cues OT Short Term Goal 3 (Week 1): Pt will complete LB dresing with min A using AE and LRAD PRN OT Short Term Goal 4 (Week 1): Pt will complete toilet transfers with CGA using LRAD  Skilled Therapeutic Interventions/Progress Updates:     AM Session:  Pt received sleeping in recliner, waking upon OT arrival. Pt dressed and ready for the day politely declining need for ADLs this session. Pt presenting to be in good spirits receptive to skilled OT session reporting 0/10 pain- OT offering intermittent rest breaks, repositioning, and therapeutic support to optimize participation in therapy session. Focused this session on functional transfer training, Pt education, activity tolerance, and R UE NMRE.   Mobility:  -Pt completed functional mobility to/from therapy spaces this session using rollator for endurance training and to work on Psychologist, forensic. Pt able to complete >160ft x2 trials during session with close SUP provided for session. Improved recall of need to lock/unlock breaks prior to sit<>stands and appropriate rollator positioning during functional mobility noted.   Transfer Training/Education:  -In ADL apartment, spent time discussing Pt's home set-up with emphasis on bathroom set-up, grab bar placement, and needs for AD. Pt reporting she has multiple suction cup grab bars in her shower and a grab bar that attaches  to edge of tub- educated on safety considerations and optimal placement. Educated on options for seating in shower with emphasis on TTB vs shower seat. Pt practiced completing TTB transfer and stepping over bathtub to shower seat- she completed both transfers with close SUP-CGA for safety +verbal cues for technique. She reported d/t her bathroom set-up, she would prefer shower seat vs TTB. Pt is safe to complete step over transfer into bathtub and complete shower seated on shower seat. Educated on [facility] and shower curtain management with Pt receptive to education.   NMRE:  -While seated at table top, engaged Pt in completing functional reaching activity to work on maintained grasp and VMC. Pt tasked with using hand strengthener on level 1 setting to pick up jenga blocks and transport them to cups. She completed activity with mod dropping initially, fading to min as she learned activity. Pt with improved functional use of R hand overall this session.   Pt was left resting in recliner with call bell in reach and all needs met.    PM Session:  Pt received sitting up in recliner, lightly sleeping waking upon OT arrival. Pt presenting to be in good spirits receptive to skilled OT session reporting 0/10 pain- OT offering intermittent rest breaks, repositioning, and therapeutic support to optimize participation in therapy session. Focused this session on ADL retraining, dynamic balance, VMC, and activity tolerance.  Mobility:  -Pt completed functional mobility to/from therapy spaces this session using rollator for endurance training and to work on Psychologist, forensic. Pt able to complete >122ft x2 trials during session with close SUP provided for session.  ADL:  -Pt requesting to  use restroom at beginning of session. 3/3 toileting tasks completed with light min A required to fully bring pants over R hip. Pt noted to have small amount of BM in brief from this AM. She was able to doff dirty brief/pants  and donn clean ones with SBA provided when weaving feet and light min A provided to adjust pants over R hip in standing.   Therapeutic Activity:  -Pt completed series of activities in standing position without AD while standing on compliant surface to work on dynamic standing balance, reactionary balance, VMC, functional reaching, and activity tolerance.  -Visual Scanning > Single Target/User Paced > Numbers/Letters: Task completed to challenge eye hand coordination, peripheral awareness, hand speed, and reaction time. During activity targets appeared one at a time and Pt was tasked with visually scanning screen to locate and touch each stimulus as it appears, at their own pace. Pt instructed to touch as many stimuli as possible, as fast as possible. Pt completed task with reaction time of 2.25 sec with 80 hits in 3 min with accuracy of 90%, CGA provided for balance.  -Visual Pursuits > Rotator/Sequence > Numbers 1-30 > Reversed. In this activity, a rotating wheel appeared on the display screen and multiple target stimuli appeared on the rotator. Pt was instructed to visually track the stimuli and accurately touch and eliminate them in the correct sequence. Activity completed to challenge Pt's ability to visually follow moving objects, plan, and coordinate motor movements. Pt completed task with accuracy of 96.77% in 1 min 4 sec with reaction time of 2.14 sec.  -Visual Motor > Drawing/Trace results >Shapes: During this activity, a shape appeared on the screen and pt was tasked to carefully trace directly over the shape with stylist. Pt completed total of 10 shapes in 6 min 44 sec with coverage of 98.55% and CGA provided for dynamic standing balance (line width 4, pen width 3, buffer 1).   Therapeutic Exercise:  -Engaged Pt in completing B U/LE endurance training on NuStep to improve reciprocal B U/LE movements and to improve activity tolerance for improved participation in ADLs. Pt able to complete total of  8 minutes on level 3 setting with seated rest break provided following.   Pt was left resting in recliner with call bell in reach and all needs met.    Therapy Documentation Precautions:  Precautions Precautions: Fall, Other (comment) Recall of Precautions/Restrictions: Intact Precaution/Restrictions Comments: watch bp Restrictions Weight Bearing Restrictions Per Provider Order: No   Therapy/Group: Individual Therapy  Katheryn SHAUNNA [place] 11/05/2023, 8:00 AM

## 2023-11-05 NOTE — Progress Notes (Signed)
 Physical Therapy Session Note  Patient Details  Name: Holly Henry MRN: [id number] Date of Birth: [birth year]  Today's Date: 11/05/2023 PT Individual Time: 0920-1028 PT Individual Time Calculation (min): 68 min   Short Term Goals: Week 1:  PT Short Term Goal 1 (Week 1): Pt willl complete bed mobility with CGA PT Short Term Goal 2 (Week 1): Pt will complete bed to chair with CGA PT Short Term Goal 3 (Week 1): Pt will ambulate x150' with CGA and LRAD PT Short Term Goal 4 (Week 1): Pt will improve Berg by MCID  Skilled Therapeutic Interventions/Progress Updates: Patient sitting in WC on entrance to room. Patient alert and agreeable to PT session.   Patient reported unrated pain in R knee (arthritis) and was provided with medication prior to arrival. Pt reporting wanting to work on writing with R UE during today's session.   Therapeutic Activity: Transfers: Pt performed sit<>stand transfers throughout session with rollator/no AD with supervision and VC/education to lock brakes on rollator.  - Pt ambulated from room<main gym in rollator with pt also educated on how to safely sit on rollator by stabilizing against wall and locking mechanism. Pt performed with supervision  6 Min Walk Test:  Instructed patient to ambulate as quickly and as safely as possible for 6 minutes using LRAD. Patient was allowed to take standing rest breaks without stopping the test, but if the patient required a sitting rest break the clock would be stopped and the test would be over.  Results: 730 feet (222.504 meters, Avg speed .624m/s) using a rollator with supervision and no standing rest breaks required. Results indicate that the patient has reduced endurance with ambulation compared to age matched norms.  Age Matched Norms: 57-69 yo M: 48 F: 15, 67-79 yo M: 32 F: 471, 5-89 yo M: 417 F: 392 MDC: 58.21 meters (190.98 feet) or 50 meters (ANPTA Core Set of Outcome Measures for Adults with Neurologic Conditions,  2018)  Neuromuscular Re-ed: - Pt picked up random called out number of discs from table (flipped upside down), and instructed to turn over and remember the sequence of numbers. Pt then ambulated without AD roughly 10' (CGA) to write number in order (needed one reminder at beginning to write in order) on mirror with dry erase marker. Pt wrote big in size. Up to 3 numbers remembered, and on final trial, PTA distracted pt with pt still able to recall sequence of 2.   - Pt progressed to doing same task with random words this time (orange, carrot, zebra (wrote zero on board after being distracted), onion, distance, ball). Pt also cued to write words between the lines that ranged from 1.5-2 cm. Pt demonstrated ability to appropriately place lettering close in words, and space out different words and in legible manner. Pt used R UE for tasks above.   NMR performed for improvements in motor control and coordination, balance, sequencing, judgement, and self confidence/ efficacy in performing all aspects of mobility at highest level of independence.   Patient sitting in recliner at end of session with brakes locked, and all needs within reach.      Therapy Documentation Precautions:  Precautions Precautions: Fall, Other (comment) Recall of Precautions/Restrictions: Intact Precaution/Restrictions Comments: watch bp Restrictions Weight Bearing Restrictions Per Provider Order: No  Therapy/Group: Individual Therapy  Lota Leamer PTA 11/05/2023, 12:32 PM

## 2023-11-05 NOTE — Progress Notes (Signed)
 PROGRESS NOTE   Subjective/Complaints: Amb > 100' with therapy , occ HA pain , also has B Knee pain due to RA after walking with PT, pt takes tramadol  for this   Reviewed CXR results neg  ROS- neg abdominal pain, CP, chills,  N/V/D, shortness of breath Objective:   DG Chest 2 View Result Date: 11/03/2023 CLINICAL DATA:  Expect Ori wheezing EXAM: CHEST - 2 VIEW COMPARISON:  08/21/2023 FINDINGS: No acute airspace disease or pleural effusion. Stable cardiomediastinal silhouette with aortic atherosclerosis. Chronic right upper rib deformities. No pneumothorax IMPRESSION: No active cardiopulmonary disease. Electronically Signed   By: Luke Bun M.D.   On: 11/03/2023 22:54   Recent Labs    11/03/23 0451  WBC 6.7  HGB 11.6*  HCT 36.8  PLT 235    Recent Labs    11/03/23 0912  NA 135  K 4.2  CL 101  CO2 25  GLUCOSE 142*  BUN 20  CREATININE 0.80  CALCIUM  9.1     Intake/Output Summary (Last 24 hours) at 11/05/2023 0836 Last data filed at 11/04/2023 2349 Gross per 24 hour  Intake 1316 ml  Output --  Net 1316 ml        Physical Exam: Vital Signs Blood pressure (!) 150/81, pulse 72, temperature 98.2 F (36.8 C), resp. rate 18, height 5' 1 (1.549 m), weight 78.1 kg, SpO2 96%.   General: No acute distress Mood and affect are appropriate Heart: Regular rate and rhythm no rubs murmurs or extra sounds Lungs: Clear to auscultation, breathing unlabored, no rales or wheezes Abdomen: Positive bowel sounds, soft nontender to palpation, nondistended Extremities: No clubbing, cyanosis, or edema Skin: No evidence of breakdown, no evidence of rash, scalp is clear mild tenderness just left of midline near vertex Neurologic: Cranial nerves II through XII intact, motor strength is 5/5 in left and 3-/5 right deltoid, bicep, tricep, grip, 5/5 left and 4/5 right hip flexor, knee extensors, ankle dorsiflexor and plantar  flexor Sensory exam normal sensation to light touch in bilateral upper and lower extremities Cerebellar exam normal finger to nose to finger Musculoskeletal: MCP PIP and DIP deformities in bilateral hands unable to make fist on left side related to deformities  No tenderness over the cervical spine or trapezius area  Assessment/Plan: 1. Functional deficits which require 3+ hours per day of interdisciplinary therapy in a comprehensive inpatient rehab setting. Physiatrist is providing close team supervision and 24 hour management of active medical problems listed below. Physiatrist and rehab team continue to assess barriers to discharge/monitor patient progress toward functional and medical goals  Care Tool:  Bathing    Body parts bathed by patient: Right arm, Chest, Abdomen, Front perineal area, Right upper leg, Left upper leg   Body parts bathed by helper: Right arm, Left arm, Chest, Abdomen, Right upper leg, Buttocks, Front perineal area, Left upper leg, Right lower leg, Left lower leg, Face Body parts n/a: Buttocks   Bathing assist Assist Level: Minimal Assistance - Patient > 75%     Upper Body Dressing/Undressing Upper body dressing   What is the patient wearing?: Pull over shirt    Upper body assist Assist Level: Moderate Assistance -  Patient 50 - 74%    Lower Body Dressing/Undressing Lower body dressing      What is the patient wearing?: Underwear/pull up, Pants     Lower body assist Assist for lower body dressing: Maximal Assistance - Patient 25 - 49%     Toileting Toileting    Toileting assist Assist for toileting: Minimal Assistance - Patient > 75%     Transfers Chair/bed transfer  Transfers assist     Chair/bed transfer assist level: Supervision/Verbal cueing     Locomotion Ambulation   Ambulation assist      Assist level: Supervision/Verbal cueing Assistive device: Walker-rolling Max distance: 250   Walk 10 feet activity   Assist      Assist level: Minimal Assistance - Patient > 75% Assistive device: No Device   Walk 50 feet activity   Assist    Assist level: Minimal Assistance - Patient > 75% Assistive device: No Device    Walk 150 feet activity   Assist Walk 150 feet activity did not occur: Safety/medical concerns         Walk 10 feet on uneven surface  activity   Assist     Assist level: Minimal Assistance - Patient > 75%     Wheelchair     Assist Is the patient using a wheelchair?: Yes Type of Wheelchair: Manual    Wheelchair assist level: Dependent - Patient 0% Max wheelchair distance: 150'    Wheelchair 50 feet with 2 turns activity    Assist        Assist Level: Dependent - Patient 0%   Wheelchair 150 feet activity     Assist      Assist Level: Dependent - Patient 0%   Blood pressure (!) 150/81, pulse 72, temperature 98.2 F (36.8 C), resp. rate 18, height 5' 1 (1.549 m), weight 78.1 kg, SpO2 96%.  Medical Problem List and Plan: 1. 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  Antithrombotics: -DVT/anticoagulation:  Pharmaceutical: Other (comment) Pradaxa              -antiplatelet therapy: N/A 3. Pain Management: Oxycodone  5 mg every 4 hours as needed,  tramadol  for moderate pain              - Pt reports she was previously discharged from [facility] pain clinic after oxycodone  was not found in UDS Will d/c hydrocodone  and tramadol  prior to d/c , used tylenol  and NSAID PTA for pain , would rec tylenol  only   - Increase gabapentin  to 300 mg 3 times daily  4. Mood/Behavior/Sleep: BuSpar  15 mg twice daily, Cymbalta  90 mg daily, melatonin 5 mg nightly as needed             -antipsychotic agents: N/A 5.  Neuropsych/cognition: This patient is capable of making decisions on her own behalf. 6. Skin/Wound Care: Routine skin checks No scalp lesions to correlate with area of mild tenderness to palpation left of vertex, MRI showing no skull or scalp abnormalities  7. Fluids/Electrolytes/Nutrition: Routine in and outs with follow-up chemistries 8.  Hyperlipidemia.  Lipitor /Zetia  9.  GERD.  Protonix  10. Atrial fibrillation.  Continue Pradaxa .  Cardiac rate controlled. But HRs creeping up to 90-100 resume metoprolol , flecainide on hold for now  11.  Prediabetes.  Hemoglobin A1c 6.0.  Currently on a regular diet.  CBGs discontinued 12. Obesity.  Dietary counseling             Body mass index is 31.95 kg/m. 13, Rheumatoid arthritis/Fibromyalgia              -Continue Cymbalta , pain control             -She says was on methotrexate in the past but this was stopped by her rheumatologist about 6 months ago, states she has been on Orencia              -She was on gabapentin  300mg  TID before admission, Restart at lower dose 100mg  TID for now, could consider increasing back up at a later time- see No 3, increase gabapentin  dose 14.  Hypertension.  Resume metoprolol .Improved HR 76-80bpm  Vitals:   11/04/23 1920 11/05/23 0508  BP: 123/77 (!) 150/81  Pulse: 84 72  Resp: 18 18  Temp: 97.7 F (36.5 C) 98.2 F (36.8 C)  SpO2: 99% 96%    15. Constipation, Continue MiraLAX  daily and sorbitol  as needed             - Patient got sorbitol  today, consider additional medication tomorrow if no BM  - 7/21 LBM recorded 7/17, scheduled MiraLAX , also will asked nursing to confirm and will do sorbitol  if this is the case 16. Azotemia, mild enc po fluids f/u BMP today    Latest Ref Rng & Units 11/03/2023    9:12 AM 10/29/2023    4:40 AM 10/25/2023   10:17 PM  BMP  Glucose 70 - 99 mg/dL 857  887  88   BUN 8 - 23 mg/dL 20  27  18    Creatinine 0.44 - 1.00 mg/dL 9.19  8.91  8.99   Sodium 135 - 145 mmol/L 135  137  141    Potassium 3.5 - 5.1 mmol/L 4.2  4.1  4.0   Chloride 98 - 111 mmol/L 101  104  107   CO2 22 - 32 mmol/L 25  24    Calcium  8.9 - 10.3 mg/dL 9.1  8.8      17. Anemia mild, recheck stable     Latest Ref Rng & Units 11/03/2023    4:51 AM 10/29/2023    4:40 AM 10/25/2023   10:17 PM  CBC  WBC 4.0 - 10.5 K/uL 6.7  7.8    Hemoglobin 12.0 - 15.0 g/dL 88.3  88.3  87.7   Hematocrit 36.0 - 46.0 % 36.8  36.1  36.0   Platelets 150 - 400 K/uL 235  245             LOS: 8 days A FACE TO FACE EVALUATION WAS PERFORMED  Prentice FORBES Compton 11/05/2023, 8:36 AM

## 2023-11-05 NOTE — Progress Notes (Signed)
 Patient ID: Holly Henry, female   DOB: 1956/01/03, 68 y.o.   MRN: [id number] Met with the patient and daughter Delon to review team conference updates. Reviewed discharge date of 11/11/23 at a supervision level overall with min assist needed for home management. Team recommending OP follow up services PT/OT;patient preference for [facility] OP rehab  at [facility] on [address], [place].  Referral order submitted. Discussion on DME TTB/SS vs rollator/RW/Transport chair and team decided on SS and Rollator. DME ordered through [facility]. Reviewed patient currently needs supervision for upper body and CGA for lower body with supervision for transfers using adaptive equipment. Needs CGA for balance issues but able to ambulate up to 200' using a RW.  Will need to follow up with PM+R for pain management and Rheumatology for RA.  No other concerns noted, continue to follow along to address identified barriers to facilitate preparation for discharge. [name], RN

## 2023-11-06 NOTE — Progress Notes (Signed)
 PROGRESS NOTE   Subjective/Complaints: Slept ok , good appetite , no bowel or bladder issues   Reviewed CXR results neg  ROS- neg abdominal pain, CP, chills,  N/V/D, shortness of breath Objective:   No results found.  No results for input(s): WBC, HGB, HCT, PLT in the last 72 hours.   Recent Labs    11/03/23 0912  NA 135  K 4.2  CL 101  CO2 25  GLUCOSE 142*  BUN 20  CREATININE 0.80  CALCIUM  9.1     Intake/Output Summary (Last 24 hours) at 11/06/2023 0723 Last data filed at 11/05/2023 1826 Gross per 24 hour  Intake 720 ml  Output --  Net 720 ml        Physical Exam: Vital Signs Blood pressure (!) 141/77, pulse 71, temperature 97.7 F (36.5 C), temperature source Oral, resp. rate 18, height 5' 1 (1.549 m), weight 78.1 kg, SpO2 96%.   General: No acute distress Mood and affect are appropriate Heart: Regular rate and rhythm no rubs murmurs or extra sounds Lungs: Clear to auscultation, breathing unlabored, no rales or wheezes Abdomen: Positive bowel sounds, soft nontender to palpation, nondistended Extremities: No clubbing, cyanosis, or edema Skin: No evidence of breakdown, no evidence of rash, scalp is clear mild tenderness just left of midline near vertex Neurologic: Cranial nerves II through XII intact, motor strength is 5/5 in left and 3-/5 right deltoid, bicep, tricep, grip, 5/5 left and 4/5 right hip flexor, knee extensors, ankle dorsiflexor and plantar flexor Sensory exam normal sensation to light touch in bilateral upper and lower extremities Cerebellar exam normal finger to nose to finger Musculoskeletal: MCP PIP and DIP deformities in bilateral hands unable to make fist on left side related to deformities  No tenderness over the cervical spine or trapezius area  Assessment/Plan: 1. Functional deficits which require 3+ hours per day of interdisciplinary therapy in a comprehensive inpatient  rehab setting. Physiatrist is providing close team supervision and 24 hour management of active medical problems listed below. Physiatrist and rehab team continue to assess barriers to discharge/monitor patient progress toward functional and medical goals  Care Tool:  Bathing    Body parts bathed by patient: Right arm, Chest, Abdomen, Front perineal area, Right upper leg, Left upper leg   Body parts bathed by helper: Right arm, Left arm, Chest, Abdomen, Right upper leg, Buttocks, Front perineal area, Left upper leg, Right lower leg, Left lower leg, Face Body parts n/a: Buttocks   Bathing assist Assist Level: Minimal Assistance - Patient > 75%     Upper Body Dressing/Undressing Upper body dressing   What is the patient wearing?: Pull over shirt    Upper body assist Assist Level: Moderate Assistance - Patient 50 - 74%    Lower Body Dressing/Undressing Lower body dressing      What is the patient wearing?: Underwear/pull up, Pants     Lower body assist Assist for lower body dressing: Maximal Assistance - Patient 25 - 49%     Toileting Toileting    Toileting assist Assist for toileting: Minimal Assistance - Patient > 75%     Transfers Chair/bed transfer  Transfers assist  Chair/bed transfer assist level: Supervision/Verbal cueing     Locomotion Ambulation   Ambulation assist      Assist level: Supervision/Verbal cueing Assistive device: Walker-rolling Max distance: 250   Walk 10 feet activity   Assist     Assist level: Minimal Assistance - Patient > 75% Assistive device: No Device   Walk 50 feet activity   Assist    Assist level: Minimal Assistance - Patient > 75% Assistive device: No Device    Walk 150 feet activity   Assist Walk 150 feet activity did not occur: Safety/medical concerns         Walk 10 feet on uneven surface  activity   Assist     Assist level: Minimal Assistance - Patient > 75%      Wheelchair     Assist Is the patient using a wheelchair?: Yes Type of Wheelchair: Manual    Wheelchair assist level: Dependent - Patient 0% Max wheelchair distance: 150'    Wheelchair 50 feet with 2 turns activity    Assist        Assist Level: Dependent - Patient 0%   Wheelchair 150 feet activity     Assist      Assist Level: Dependent - Patient 0%   Blood pressure (!) 141/77, pulse 71, temperature 97.7 F (36.5 C), temperature source Oral, resp. rate 18, height 5' 1 (1.549 m), weight 78.1 kg, SpO2 96%.  Medical Problem List and Plan: 1. 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  Antithrombotics: -DVT/anticoagulation:  Pharmaceutical: Other (comment) Pradaxa              -antiplatelet therapy: N/A 3. Pain Management: Oxycodone  5 mg every 4 hours as needed,  tramadol  for moderate pain              - Pt reports she was previously discharged from [facility] pain clinic after oxycodone  was not found in UDS Will d/c hydrocodone  and tramadol  prior to d/c , used tylenol  and NSAID PTA for pain , would rec tylenol  only  Currently using ~1 tramadol  per day and 2 hydrocodone  - will not have as much activity post d/c   - Increase gabapentin  to 300 mg 3 times daily  4. Mood/Behavior/Sleep: BuSpar  15 mg twice daily, Cymbalta  90 mg daily, melatonin 5 mg nightly as needed             -antipsychotic agents: N/A 5. Neuropsych/cognition: This patient is capable of making decisions on her own behalf. 6. Skin/Wound Care: Routine skin checks No scalp lesions to correlate with area of mild tenderness to palpation left of vertex, MRI showing no skull or scalp abnormalities  7. Fluids/Electrolytes/Nutrition: Routine in and outs with  follow-up chemistries 8.  Hyperlipidemia.  Lipitor /Zetia  9.  GERD.  Protonix  10. Atrial fibrillation.  Continue Pradaxa .  Cardiac rate controlled. But HRs creeping up to 90-100 resume metoprolol , flecainide on hold for now  11.  Prediabetes.  Hemoglobin A1c 6.0.  Currently on a regular diet.  CBGs discontinued 12. Obesity.  Dietary counseling  Body mass index is 31.95 kg/m. 13, Rheumatoid arthritis/Fibromyalgia              -Continue Cymbalta , pain control             -She says was on methotrexate in the past but this was stopped by her rheumatologist about 6 months ago, states she has been on Orencia              -She was on gabapentin  300mg  TID before admission, Restart at lower dose 100mg  TID for now, could consider increasing back up at a later time- see No 3, increase gabapentin  dose 14.  Hypertension.  Resume metoprolol .Improved HR 76-80bpm  Vitals:   11/05/23 1918 11/06/23 0516  BP: 135/68 (!) 141/77  Pulse: 77 71  Resp: 18 18  Temp: 97.7 F (36.5 C) 97.7 F (36.5 C)  SpO2: 98% 96%    15. Constipation, Continue MiraLAX  daily and sorbitol  as needed             - Patient got sorbitol  today, consider additional medication tomorrow if no BM  - 7/21 LBM recorded 7/17, scheduled MiraLAX , also will asked nursing to confirm and will do sorbitol  if this is the case 16. Azotemia, mild enc po fluids f/u BMP today    Latest Ref Rng & Units 11/03/2023    9:12 AM 10/29/2023    4:40 AM 10/25/2023   10:17 PM  BMP  Glucose 70 - 99 mg/dL 857  887  88   BUN 8 - 23 mg/dL 20  27  18    Creatinine 0.44 - 1.00 mg/dL 9.19  8.91  8.99   Sodium 135 - 145 mmol/L 135  137  141   Potassium 3.5 - 5.1 mmol/L 4.2  4.1  4.0   Chloride 98 - 111 mmol/L 101  104  107   CO2 22 - 32 mmol/L 25  24    Calcium  8.9 - 10.3 mg/dL 9.1  8.8      17. Anemia mild, recheck stable     Latest Ref Rng & Units 11/03/2023    4:51 AM 10/29/2023    4:40 AM 10/25/2023   10:17 PM  CBC  WBC 4.0 - 10.5 K/uL 6.7   7.8    Hemoglobin 12.0 - 15.0 g/dL 88.3  88.3  87.7   Hematocrit 36.0 - 46.0 % 36.8  36.1  36.0   Platelets 150 - 400 K/uL 235  245             LOS: 9 days A FACE TO FACE EVALUATION WAS PERFORMED  Prentice BRAVO Leata Dominy 11/06/2023, 7:23 AM

## 2023-11-06 NOTE — Progress Notes (Signed)
 Occupational Therapy Weekly Progress Note  Patient Details  Name: Holly Henry MRN: [id number] Date of Birth: 02/26/1956  Beginning of progress report period: October 29, 2023 End of progress report period: November 06, 2023  Today's Date: 11/06/2023 OT Individual Time: 9082-8985 OT Individual Time Calculation (min): 57 min  OT Individual Time: 8697-8588 OT Individual Time Calculation (min): 69 min   Patient has met 4 of 4 short term goals. Holly Henry is making appropriate progress towards reaching her LTGs. She is completing UB ADLs with SUP and LB ADLs with CGA to intermittent MIN A. She can complete toileting with CGA, occasional MIN A required for clothing management, and she completes ambulatory transfers using rollator with close SUP-CGA. She is demonstrating improved functional use of her R hand is able to use it at a diminished level with SUP. She is planning to d/c home with 24/7 assistance from her DTR. Family education is planned for next week prior to d/c.   Patient continues to demonstrate the following deficits: muscle weakness, decreased cardiorespiratoy endurance, unbalanced muscle activation, decreased coordination, and decreased motor planning, central origin, and decreased standing balance, decreased postural control, and decreased balance strategies and therefore will continue to benefit from skilled OT intervention to enhance overall performance with BADL, iADL, and Reduce care partner burden.  Patient progressing toward long term goals..  Plan of care revisions: See POC note. Goals upgraded based on Pt's CLOF and ELOS.  OT Short Term Goals Week 1:  OT Short Term Goal 1 (Week 1): Pt will recall hemidressing techniques with min questioning cues OT Short Term Goal 1 - Progress (Week 1): Met OT Short Term Goal 2 (Week 1): Pt will maintain R UE in safe position during functional transfers, ADLs, and when sitting in wc with min verbal cues OT Short Term Goal 2 - Progress (Week 1):  Met OT Short Term Goal 3 (Week 1): Pt will complete LB dresing with min A using AE and LRAD PRN OT Short Term Goal 3 - Progress (Week 1): Met OT Short Term Goal 4 (Week 1): Pt will complete toilet transfers with CGA using LRAD OT Short Term Goal 4 - Progress (Week 1): Met Week 2:  OT Short Term Goal 1 (Week 2): STG=LTG d/t ELOS  Skilled Therapeutic Interventions/Progress Updates:     AM Session:  Pt received semi-reclined in bed presenting to be in good spirits receptive to skilled OT session reporting 4/10 pain in shoulders- OT offering intermittent rest breaks, repositioning, and therapeutic support to optimize participation in therapy session. Focused this session on IADL retraining to increase overall independence and safety in IADLs. Pt transitioned to EOB SUP and completed functional mobility to therapy apartment using rollator with SUP, no LOB and seated rest break provided following. Provided education on fall prevention, energy conservation, and simple home modifications to increase overall safety and independence in IADLs. Engaged Pt in completing series of home making activities to simulate completing IADLs within her home. Pt able to retrieve cooking utensils, pots/pans, and plates/bowls from cabinets with SUP using rollator. She completed meal preparation activity simulating preparing spaghetti and then cleaned up environment, washing dishes in standing position using rollator. During IADLs, worked on on Psychologist, forensic with OT providing education on positioning to increase safety and prevent falls with Pt receptive to education, demonstrating teach back as evidence of learning. Pt then completed simulated laundry task folding large and small towels in seated position for energy conservation- improved functional use of R UE this  session at a diminished level. Transported items on rollator to shelves and put items away without LOB. Pt with improved safety awareness and dynamic  balance this session. She completed functional mobility back to her room with SUP using rollator. Pt was left resting in recliner with call bell in reach and all needs met.    PM Session:  Pt received semi-reclined in bed presenting to be in good spirits receptive to skilled OT session reporting 3/10 pain in shoulders- OT offering intermittent rest breaks, repositioning, and therapeutic support to optimize participation in therapy session. Pt requesting to take shower, focused this session on ADL retraining to increase overall independence in preparation for d/c. Pt completed functional mobility to laundry room using rollator with close SUP, no LOB. Engaged Pt in completing laundry task transferring her clean laundry from washer to drier while standing with rollator- CGA provided for balance when reaching into drier and min verbal cues provided for rollator positioning. She ambulated back to her room using rollator with close SUP and transferred to standard toilet. Doffed clothes while seated on toilet close SUP. Pt with continent void- documented in flowsheet. She had some BM beginning to seep out, however no BM at this time- she completed anterior/posterior peri care in seated position with SUP. Ambulatory transfer to walk-in shower using rollator. Majority of U/LB bathing completed in seated position for energy conservation and to increased safety. UB bathing completed with SUP and LB bathing completed with CGA provided only when standing to wash buttocks. Pt utilized long handled sponge to wash B LEs demonstrating teach back as evidence of learning. Improved functional use of R UE noted during bathing tasks with Pt able to maintain grasp on wash cloth and utilize at a gross assist level with SUP. Following shower, Pt applied lotion while sitting EOB with MIN A required for squeezing lotion out of bottle. Donned [place] shirt with SUP and brief/pants with CGA this session. She returned to bed at end of session with  SUP. Pt was left resting in bed with call bell in reach, bed alarm on, and all needs met.    Therapy Documentation Precautions:  Precautions Precautions: Fall, Other (comment) Recall of Precautions/Restrictions: Intact Precaution/Restrictions Comments: watch bp Restrictions Weight Bearing Restrictions Per Provider Order: No   Therapy/Group: Individual Therapy  Katheryn SHAUNNA [place] 11/06/2023, 8:03 AM

## 2023-11-06 NOTE — Plan of Care (Signed)
  Problem: RH Functional Use of Upper Extremity Goal: LTG Patient will use RT/LT upper extremity as a (OT) Description: LTG: Patient will use right/left upper extremity as a stabilizer/gross assist/diminished/nondominant/dominant level with assist, with/without cues during functional activity (OT) Flowsheets (Taken 11/06/2023 1243) LTG: Use of upper extremity in functional activities: RUE as diminished level LTG: Pt will use upper extremity in functional activity with assistance level of: Independent Note: Goal upgraded based on Pt's CLOF and ELOS   Problem: RH Simple Meal Prep Goal: LTG Patient will perform simple meal prep w/assist (OT) Description: LTG: Patient will perform simple meal prep with assistance, with/without cues (OT). Flowsheets (Taken 11/06/2023 1243) LTG: Pt will perform simple meal prep with assistance level of: Supervision/Verbal cueing LTG: Pt will perform simple meal prep w/level of: Ambulate with device Note: Goal upgraded based on Pt's CLOF and ELOS

## 2023-11-06 NOTE — Progress Notes (Signed)
 Patient ID: Holly Henry, female   DOB: 04-Feb-1956, 68 y.o.   MRN: [id number]  This SW covering for primary SW Becky Dupree.   SW met with pt in room to provide contact information for [employer] sharing she needs to make her copay.   Graeme Jude, MSW, LCSW Office: [phone] Cell: [phone] Fax: [phone]

## 2023-11-06 NOTE — Plan of Care (Signed)
  Problem: Consults Goal: RH STROKE PATIENT EDUCATION Description: See Patient Education module for education specifics  Outcome: Progressing   Problem: RH BOWEL ELIMINATION Goal: RH STG MANAGE BOWEL WITH ASSISTANCE Description: STG Manage Bowel with mod I Assistance. Outcome: Progressing Goal: RH STG MANAGE BOWEL W/MEDICATION W/ASSISTANCE Description: STG Manage Bowel with Medication with mod I  Assistance. Outcome: Progressing   Problem: RH SAFETY Goal: RH STG ADHERE TO SAFETY PRECAUTIONS W/ASSISTANCE/DEVICE Description: STG Adhere to Safety Precautions With cues Assistance/Device. Outcome: Progressing   Problem: RH PAIN MANAGEMENT Goal: RH STG PAIN MANAGED AT OR BELOW PT'S PAIN GOAL Description: < 4 with prns Outcome: Progressing   Problem: RH KNOWLEDGE DEFICIT Goal: RH STG INCREASE KNOWLEDGE OF HYPERTENSION Description: Patient and dtr will be able to manage HTN using educational resources for medications and dietary modification indpendently Outcome: Progressing Goal: RH STG INCREASE KNOWLEGDE OF HYPERLIPIDEMIA Description: Patient and dtr will be able to manage HLD using educational resources for medications and dietary modification indpendently Outcome: Progressing Goal: RH STG INCREASE KNOWLEDGE OF STROKE PROPHYLAXIS Description: Patient and dtr will be able to manage secondary risks using educational resources for medications and dietary modification indpendently Outcome: Progressing

## 2023-11-07 DIAGNOSIS — G894 Chronic pain syndrome: Secondary | ICD-10-CM

## 2023-11-07 NOTE — Progress Notes (Signed)
 PROGRESS NOTE   Subjective/Complaints: Pt up in bed. No new complaints today  ROS: Patient denies fever, rash, sore throat, blurred vision, dizziness, nausea, vomiting, diarrhea, cough, shortness of breath or chest pain, joint or back/neck pain, headache, or mood change.    Objective:   No results found.  No results for input(s): WBC, HGB, HCT, PLT in the last 72 hours.   No results for input(s): NA, K, CL, CO2, GLUCOSE, BUN, CREATININE, CALCIUM  in the last 72 hours.    Intake/Output Summary (Last 24 hours) at 11/07/2023 0858 Last data filed at 11/06/2023 1800 Gross per 24 hour  Intake 472 ml  Output --  Net 472 ml        Physical Exam: Vital Signs Blood pressure 121/69, pulse 77, temperature 97.7 F (36.5 C), temperature source Oral, resp. rate 17, height 5' 1 (1.549 m), weight 78.1 kg, SpO2 95%.   Constitutional: No distress . Vital signs reviewed. HEENT: NCAT, EOMI, oral membranes moist Neck: supple Cardiovascular: RRR without murmur. No JVD    Respiratory/Chest: CTA Bilaterally without wheezes or rales. Normal effort    GI/Abdomen: BS +, non-tender, non-distended Ext: no clubbing, cyanosis, or edema Psych: pleasant and cooperative  Skin: No evidence of breakdown, no evidence of rash, scalp is clear mild tenderness just left of midline near vertex Neurologic: Cranial nerves II through XII intact, motor strength is 5/5 in left and 3-/5 right deltoid, bicep, tricep, grip, 5/5 left and 4/5 right hip flexor, knee extensors, ankle dorsiflexor and plantar flexor Sensory exam normal sensation to light touch in bilateral upper and lower extremities Cerebellar exam normal finger to nose to finger Musculoskeletal: 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 Prior neuro assessment is  c/w 11/07/2023 exam.   Assessment/Plan: 1. Functional deficits which require 3+ hours per day of interdisciplinary therapy in a comprehensive inpatient rehab setting. Physiatrist is providing close team supervision and 24 hour management of active medical problems listed below. Physiatrist and rehab team continue to assess barriers to discharge/monitor patient progress toward functional and medical goals  Care Tool:  Bathing    Body parts bathed by patient: Chest, Abdomen, Front perineal area, Right upper leg, Left upper leg, Left arm, Right arm, Right lower leg, Left lower leg, Face, Buttocks   Body parts bathed by helper: Right arm, Left arm, Chest, Abdomen, Right upper leg, Buttocks, Front perineal area, Left upper leg, Right lower leg, Left lower leg, Face Body parts n/a: Buttocks   Bathing assist Assist Level: Contact Guard/Touching assist     Upper Body Dressing/Undressing Upper body dressing   What is the patient wearing?: Pull over shirt    Upper body assist Assist Level: Supervision/Verbal cueing    Lower Body Dressing/Undressing Lower body dressing      What is the patient wearing?: Pants, Underwear/pull up     Lower body assist Assist for lower body dressing: Contact Guard/Touching assist     Toileting Toileting    Toileting assist Assist for toileting: Contact Guard/Touching assist     Transfers Chair/bed transfer  Transfers  assist     Chair/bed transfer assist level: Supervision/Verbal cueing     Locomotion Ambulation   Ambulation assist      Assist level: Supervision/Verbal cueing Assistive device: Walker-rolling Max distance: 250   Walk 10 feet activity   Assist     Assist level: Minimal Assistance - Patient > 75% Assistive device: No Device   Walk 50 feet activity   Assist    Assist level: Minimal Assistance - Patient > 75% Assistive device: No Device    Walk 150 feet activity   Assist Walk 150 feet activity did not  occur: Safety/medical concerns         Walk 10 feet on uneven surface  activity   Assist     Assist level: Minimal Assistance - Patient > 75%     Wheelchair     Assist Is the patient using a wheelchair?: Yes Type of Wheelchair: Manual    Wheelchair assist level: Dependent - Patient 0% Max wheelchair distance: 150'    Wheelchair 50 feet with 2 turns activity    Assist        Assist Level: Dependent - Patient 0%   Wheelchair 150 feet activity     Assist      Assist Level: Dependent - Patient 0%   Blood pressure 121/69, pulse 77, temperature 97.7 F (36.5 C), temperature source Oral, resp. rate 17, height 5' 1 (1.549 m), weight 78.1 kg, SpO2 95%.  Medical Problem List and Plan: 1. 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  Antithrombotics: -DVT/anticoagulation:  Pharmaceutical: Other (comment) Pradaxa              -antiplatelet therapy: N/A 3. Pain Management: Oxycodone  5 mg every 4 hours as needed,  tramadol  for moderate pain              - Pt reports she was previously discharged from [facility] after oxycodone  was not found in UDS Will d/c hydrocodone  and tramadol  prior to d/c , used tylenol  and NSAID PTA for pain , would rec tylenol  only  Currently using ~1 tramadol  per day and 2 hydrocodone  - will not have as much activity post d/c   - Increase gabapentin  to 300 mg 3 times daily  -7/26 PAIN CONTROLLED at present 4. Mood/Behavior/Sleep: BuSpar  15 mg twice daily, Cymbalta  90 mg daily, melatonin 5 mg nightly as needed             -antipsychotic agents: N/A 5. Neuropsych/cognition: This patient is capable of making decisions on her own behalf. 6. Skin/Wound Care:  Routine skin checks No scalp lesions to correlate with area of mild tenderness to palpation left of vertex, MRI showing no skull or scalp abnormalities  7. Fluids/Electrolytes/Nutrition: Routine in and outs with follow-up chemistries 8.  Hyperlipidemia.  Lipitor /Zetia  9.  GERD.  Protonix  10. Atrial fibrillation.  Continue Pradaxa .  Cardiac rate controlled. But HRs creeping up to 90-100 resume metoprolol , flecainide on hold for now  11.  Prediabetes.  Hemoglobin A1c 6.0.  Currently on a regular diet.  CBGs discontinued 12. Obesity.  Dietary counseling             Body mass index is 31.95 kg/m. 13, Rheumatoid arthritis/Fibromyalgia              -Continue Cymbalta , pain control             -She says was on methotrexate in the past but this was stopped by her rheumatologist about 6 months ago, states she has been on Orencia              -She was on gabapentin  300mg  TID before admission, Restart at lower dose 100mg  TID for now, could consider increasing back up at a later time- see No 3, increased gabapentin  dose 14.  Hypertension.  Resume metoprolol .Improved HR 76-80bpm  Vitals:   11/06/23 2102 11/07/23 0459  BP: (!) 143/76 121/69  Pulse: 76 77  Resp: 17   Temp: 97.8 F (36.6 C) 97.7 F (36.5 C)  SpO2: 98% 95%    15. Constipation, Continue MiraLAX  daily and sorbitol  as needed             - Patient got sorbitol  today, consider additional medication tomorrow if no BM  - 7/24 LBM 16. Azotemia, push fluids. ---recheck Monday     Latest Ref Rng & Units 11/03/2023    9:12 AM 10/29/2023    4:40 AM 10/25/2023   10:17 PM  BMP  Glucose 70 - 99 mg/dL 857  887  88   BUN 8 - 23 mg/dL 20  27  18    Creatinine 0.44 - 1.00 mg/dL 9.19  8.91  8.99   Sodium 135 - 145 mmol/L 135  137  141   Potassium 3.5 - 5.1 mmol/L 4.2  4.1  4.0   Chloride 98 - 111 mmol/L 101  104  107   CO2 22 - 32 mmol/L 25  24    Calcium  8.9 - 10.3 mg/dL 9.1  8.8      17. Anemia mild, recheck stable     Latest Ref Rng &  Units 11/03/2023    4:51 AM 10/29/2023    4:40 AM 10/25/2023   10:17 PM  CBC  WBC 4.0 - 10.5 K/uL 6.7  7.8    Hemoglobin 12.0 - 15.0 g/dL 88.3  88.3  87.7   Hematocrit 36.0 - 46.0 % 36.8  36.1  36.0   Platelets 150 - 400 K/uL 235  245             LOS: 10 days A FACE TO FACE EVALUATION WAS PERFORMED  Holly Henry 11/07/2023, 8:58 AM

## 2023-11-07 NOTE — Plan of Care (Signed)
  Problem: Consults Goal: RH STROKE PATIENT EDUCATION Description: See Patient Education module for education specifics  Outcome: Progressing   Problem: RH BOWEL ELIMINATION Goal: RH STG MANAGE BOWEL WITH ASSISTANCE Description: STG Manage Bowel with mod I Assistance. Outcome: Progressing Goal: RH STG MANAGE BOWEL W/MEDICATION W/ASSISTANCE Description: STG Manage Bowel with Medication with mod I  Assistance. Outcome: Progressing   Problem: RH SAFETY Goal: RH STG ADHERE TO SAFETY PRECAUTIONS W/ASSISTANCE/DEVICE Description: STG Adhere to Safety Precautions With cues Assistance/Device. Outcome: Progressing   Problem: RH PAIN MANAGEMENT Goal: RH STG PAIN MANAGED AT OR BELOW PT'S PAIN GOAL Description: < 4 with prns Outcome: Progressing   Problem: RH KNOWLEDGE DEFICIT Goal: RH STG INCREASE KNOWLEDGE OF HYPERTENSION Description: Patient and dtr will be able to manage HTN using educational resources for medications and dietary modification indpendently Outcome: Progressing Goal: RH STG INCREASE KNOWLEGDE OF HYPERLIPIDEMIA Description: Patient and dtr will be able to manage HLD using educational resources for medications and dietary modification indpendently Outcome: Progressing Goal: RH STG INCREASE KNOWLEDGE OF STROKE PROPHYLAXIS Description: Patient and dtr will be able to manage secondary risks using educational resources for medications and dietary modification indpendently Outcome: Progressing

## 2023-11-07 NOTE — Progress Notes (Signed)
 Physical Therapy Session Note  Patient Details  Name: Holly Henry MRN: [id number] Date of Birth: [birth year]  Today's Date: 11/06/2023 PT Individual Time: 1031-1113 PT Individual Time Calculation (min): 42 min  Short Term Goals: Week 1:  PT Short Term Goal 1 (Week 1): Pt willl complete bed mobility with CGA PT Short Term Goal 2 (Week 1): Pt will complete bed to chair with CGA PT Short Term Goal 3 (Week 1): Pt will ambulate x150' with CGA and LRAD PT Short Term Goal 4 (Week 1): Pt will improve Berg by MCID  Skilled Therapeutic Interventions/Progress Updates:  Patient supine in bed on entrance to room. Patient alert and agreeable to PT session.   Patient with no pain complaint at start of session.  Therapeutic Activity: Bed Mobility: Pt performed supine > sit with supervision. No cueing required for bed mobility technique.  Transfers: Pt performed sit<>stand and stand pivot transfers throughout session with supervision. Requires 2 efforts to reach upright stance upon rise from EOB initially in session. Otherwise is able to rise to stand and control descent with supervision. Stand pivots improve with RW from SBA to supervision. No cueing required for reaching back with UE to seat.  Gait Training:  Pt ambulated >200' x2 using RW with SBA/ supervision. Demonstrated slight improvement in weight shift and step height relating cues provided from earlier session. Cued for changes in pace throughout and demos improvement in quality of gait with increase in pace. Equal weight shifting and time spent on each LE.  Neuromuscular Re-ed: NMR facilitated during session with focus on standing balance and safety awareness as well as fine motor control of R hand. Pt guided in locating plastic fruits and vegetables from around ADL kitchen and bedroom. Instructed pt to find hidden items from under pan on table using R hand, in pantry, in cabinets and in [name]. Once item is found, then pt instructed to write on  paper what she found and where. Handwriting improving from initial practice with OT/ SLP.  NMR performed for improvements in motor control and coordination, balance, sequencing, judgement, and self confidence/ efficacy in performing all aspects of mobility at highest level of independence.   Patient seated upright in w/c in order to eat lunch at end of session with brakes locked, bed alarm set, and all needs within reach.   Therapy Documentation Precautions:  Precautions Precautions: Fall, Other (comment) Recall of Precautions/Restrictions: Intact Precaution/Restrictions Comments: watch bp Restrictions Weight Bearing Restrictions Per Provider Order: No  Pain:  No pain related by pt this session.   Therapy/Group: Individual Therapy  [name] PT, DPT, CSRS 11/06/2023, 8:02 AM

## 2023-11-07 NOTE — Progress Notes (Signed)
 Physical Therapy Weekly Progress Note  Patient Details  Name: Holly Henry MRN: [id number] Date of Birth: [birth year]  Beginning of progress report period: October 29, 2023 End of progress report period: November 06, 2023  Today's Date: 11/06/2023   Patient has met {number 1-5:22450} of {number 1-5:20334} short term goals.  ***  Patient continues to demonstrate the following deficits {impairments:3041632} and therefore will continue to benefit from skilled PT intervention to increase functional independence with mobility.  Patient {LTG progression:3041653}.  {plan of rjmz:6958345}  PT Short Term Goals {DUH:6958314}  Skilled Therapeutic Interventions/Progress Updates:      Therapy Documentation Precautions:  Precautions Precautions: Fall, Other (comment) Recall of Precautions/Restrictions: Intact Precaution/Restrictions Comments: watch bp Restrictions Weight Bearing Restrictions Per Provider Order: No General:   Vital Signs:   Pain:   Vision/Perception     Mobility:   Locomotion :    Trunk/Postural Assessment :    Balance:   Exercises:   Other Treatments:     Therapy/Group: {Therapy/Group:3049007}  Holly Henry 11/07/2023, 9:18 AM

## 2023-11-07 NOTE — Progress Notes (Signed)
 Physical Therapy Session Note  Patient Details  Name: Holly Henry MRN: [id number] Date of Birth: Mar 23, 1956  Today's Date: 11/06/2023 PT Individual Time: 0804-0850  PT Individual Time Calculation (min): 46 min  Short Term Goals: Week 1:  PT Short Term Goal 1 (Week 1): Pt willl complete bed mobility with CGA PT Short Term Goal 2 (Week 1): Pt will complete bed to chair with CGA PT Short Term Goal 3 (Week 1): Pt will ambulate x150' with CGA and LRAD PT Short Term Goal 4 (Week 1): Pt will improve Berg by MCID  Skilled Therapeutic Interventions/Progress Updates:  Patient supine in bed and asleep on entrance to room. Patient easily roused and quickly alert and agreeable to PT session.   Patient with no pain complaint at start of session.  Therapeutic Activity: Bed Mobility: Pt performed supine > sit with supervision. VC/ tc required for safe positioning when she leans far to R side in order to reach shoes by wall next to bed. Chooses clothes from options provided and hangs clothes from RW.  Transfers: Pt performed sit<>stand, stand pivot, and toilet transfers throughout session with supervision. Pt toilets, doffs clothes, washes with provided washcloth, and dons new clothes all with supervision/ SBA for balance. Requires extra time to pull pants on d/t RUE weakness. Provided vc for focus to R hand to improve grip/ grasp of pants to pull up.   Gait Training/ NMR:  Pt ambulated 175' x1 using RW with supervision/ SBA. Then ambulated several short, in-home distances in order to mimic home environment and challenge balance without use of RW. Demonstrated no LOB and low but adequate R foot clearance. Some instances of increased weight shift over LLE but is able to self correct weight shift and balance. Challenged with collection of weighted balls from around day room requiring good decision making and safety awareness.   NMR performed for improvements in motor control and coordination, balance,  sequencing, judgement, and self confidence/ efficacy in performing all aspects of mobility at highest level of independence.   Patient supine in bed at end of session with brakes locked, bed alarm set, and all needs within reach.   Therapy Documentation Precautions:  Precautions Precautions: Fall, Other (comment) Recall of Precautions/Restrictions: Intact Precaution/Restrictions Comments: watch bp Restrictions Weight Bearing Restrictions Per Provider Order: No  Pain:  No pain related this session.    Therapy/Group: Individual Therapy  [name] PT, DPT, CSRS 11/06/2023, 7:56 AM

## 2023-11-07 NOTE — Progress Notes (Signed)
 Physical Therapy Session Note  Patient Details  Name: Holly Henry MRN: [id number] Date of Birth: [birth year]  Today's Date: 11/07/2023 PT Individual Time: 1017-1130 PT Individual Time Calculation (min): 73 min   Short Term Goals: Week 1:  PT Short Term Goal 1 (Week 1): Pt willl complete bed mobility with CGA PT Short Term Goal 2 (Week 1): Pt will complete bed to chair with CGA PT Short Term Goal 3 (Week 1): Pt will ambulate x150' with CGA and LRAD PT Short Term Goal 4 (Week 1): Pt will improve Berg by MCID Week 2:     Skilled Therapeutic Interventions/Progress Updates:  Patient *** on entrance to room. Patient alert and agreeable to PT session.   Patient with no pain complaint at start of session.  Therapeutic Activity: Bed Mobility: Pt performed supine <> sit with ***. VC/ tc required for ***. Transfers: Pt performed sit<>stand and stand pivot transfers throughout session with ***. Provided vc/ tc for***.  Gait Training:  Pt ambulated *** ft using *** with ***. Demonstrated ***. Provided vc/ tc for ***.  Wheelchair Mobility:  Pt propelled wheelchair *** feet with ***. Provided vc/ tc for ***.  Neuromuscular Re-ed: NMR facilitated during session with focus on ***. Pt guided in ***. NMR performed for improvements in motor control and coordination, balance, sequencing, judgement, and self confidence/ efficacy in performing all aspects of mobility at highest level of independence.   Therapeutic Exercise: Pt performed the following exercises with vc/ tc for proper technique. ***  Patient *** at end of session with brakes locked, *** alarm set, and all needs within reach.   Therapy Documentation Precautions:  Precautions Precautions: Fall, Other (comment) Recall of Precautions/Restrictions: Intact Precaution/Restrictions Comments: watch bp Restrictions Weight Bearing Restrictions Per Provider Order: No General:   Vital Signs: Therapy Vitals Pulse Rate: 76 BP:  135/75 Pain:    Therapy/Group: Individual Therapy  [name] PT, DPT, CSRS 11/07/2023, 12:03 PM

## 2023-11-08 DIAGNOSIS — G43019 Migraine without aura, intractable, without status migrainosus: Secondary | ICD-10-CM

## 2023-11-08 MED ORDER — SUMATRIPTAN SUCCINATE 50 MG PO TABS
50.0000 mg | ORAL_TABLET | ORAL | Status: DC | PRN
  Administered 2023-11-08 – 2023-11-09 (×2): 50 mg via ORAL
  Filled 2023-11-08 (×3): qty 1

## 2023-11-08 MED ORDER — ACETAMINOPHEN 325 MG PO TABS
650.0000 mg | ORAL_TABLET | Freq: Four times a day (QID) | ORAL | Status: DC | PRN
  Administered 2023-11-09 – 2023-11-11 (×5): 650 mg via ORAL
  Filled 2023-11-08 (×5): qty 2

## 2023-11-08 NOTE — Plan of Care (Signed)
  Problem: Consults Goal: RH STROKE PATIENT EDUCATION Description: See Patient Education module for education specifics  Outcome: Progressing   Problem: RH BOWEL ELIMINATION Goal: RH STG MANAGE BOWEL WITH ASSISTANCE Description: STG Manage Bowel with mod I Assistance. Outcome: Progressing Goal: RH STG MANAGE BOWEL W/MEDICATION W/ASSISTANCE Description: STG Manage Bowel with Medication with mod I  Assistance. Outcome: Progressing   Problem: RH SAFETY Goal: RH STG ADHERE TO SAFETY PRECAUTIONS W/ASSISTANCE/DEVICE Description: STG Adhere to Safety Precautions With cues Assistance/Device. Outcome: Progressing   Problem: RH PAIN MANAGEMENT Goal: RH STG PAIN MANAGED AT OR BELOW PT'S PAIN GOAL Description: < 4 with prns Outcome: Progressing   Problem: RH KNOWLEDGE DEFICIT Goal: RH STG INCREASE KNOWLEDGE OF HYPERTENSION Description: Patient and dtr will be able to manage HTN using educational resources for medications and dietary modification indpendently Outcome: Progressing Goal: RH STG INCREASE KNOWLEGDE OF HYPERLIPIDEMIA Description: Patient and dtr will be able to manage HLD using educational resources for medications and dietary modification indpendently Outcome: Progressing Goal: RH STG INCREASE KNOWLEDGE OF STROKE PROPHYLAXIS Description: Patient and dtr will be able to manage secondary risks using educational resources for medications and dietary modification indpendently Outcome: Progressing

## 2023-11-08 NOTE — Progress Notes (Signed)
 Foam dressing to stage 2 on sacrum-daily dressing changes. C/O neck pain or HA, PRN norco given at 1904. At 2244, requested and given, melatonin and ultram . Continent of B&B. LBM 07/26.Holly Henry A

## 2023-11-08 NOTE — Progress Notes (Signed)
 PROGRESS NOTE   Subjective/Complaints: C/o of headache all weekend. Came on yesterday morning. Has a history of migraines. Taking tramadol  and hydrocodone  for h/a with some relief.   ROS: Patient denies fever, rash, sore throat, blurred vision, dizziness, nausea, vomiting, diarrhea, cough, shortness of breath or chest pain, joint or back/neck pain,  or mood change.     Objective:   No results found.  No results for input(s): WBC, HGB, HCT, PLT in the last 72 hours.   No results for input(s): NA, K, CL, CO2, GLUCOSE, BUN, CREATININE, CALCIUM  in the last 72 hours.    Intake/Output Summary (Last 24 hours) at 11/08/2023 0906 Last data filed at 11/07/2023 2300 Gross per 24 hour  Intake 714 ml  Output --  Net 714 ml        Physical Exam: Vital Signs Blood pressure (!) 169/87, pulse 72, temperature 98.4 F (36.9 C), temperature source Oral, resp. rate 18, height 5' 1 (1.549 m), weight 78.1 kg, SpO2 95%.   Constitutional: Looks a little uncomfortable . Vital signs reviewed. HEENT: NCAT, EOMI, oral membranes moist Neck: supple Cardiovascular: RRR without murmur. No JVD    Respiratory/Chest: CTA Bilaterally without wheezes or rales. Normal effort    GI/Abdomen: BS +, non-tender, non-distended Ext: no clubbing, cyanosis, or edema Psych: pleasant and cooperative  Skin: No evidence of breakdown, no evidence of rash, scalp is clear mild tenderness just left of midline near vertex Neurologic: Cranial nerves II through XII intact, motor strength is 5/5 in left and 3-/5 right deltoid, bicep, tricep, grip, 5/5 left and 4/5 right hip flexor, knee extensors, ankle dorsiflexor and plantar flexor Sensory exam normal sensation to light touch in bilateral upper and lower extremities Cerebellar exam normal finger to nose to finger Musculoskeletal: MCP PIP and DIP deformities in bilateral hands unable to make fist on  left side related to deformities  No tenderness over the cervical spine or trapezius area. Prior neuro assessment is c/w 11/08/2023 exam.   Assessment/Plan: 1. Functional deficits which require 3+ hours per day of interdisciplinary therapy in a comprehensive inpatient rehab setting. Physiatrist is providing close team supervision and 24 hour management of active medical problems listed below. Physiatrist and rehab team continue to assess barriers to discharge/monitor patient progress toward functional and medical goals  Care Tool:  Bathing    Body parts bathed by patient: Chest, Abdomen, Front perineal area, Right upper leg, Left upper leg, Left arm, Right arm, Right lower leg, Left lower leg, Face, Buttocks   Body parts bathed by helper: Right arm, Left arm, Chest, Abdomen, Right upper leg, Buttocks, Front perineal area, Left upper leg, Right lower leg, Left lower leg, Face Body parts n/a: Buttocks   Bathing assist Assist Level: Contact Guard/Touching assist     Upper Body Dressing/Undressing Upper body dressing   What is the patient wearing?: Pull over shirt    Upper body assist Assist Level: Supervision/Verbal cueing    Lower Body Dressing/Undressing Lower body dressing      What is the patient wearing?: Pants, Underwear/pull up     Lower body assist Assist for lower body dressing: Contact Guard/Touching assist     Toileting Toileting  Toileting assist Assist for toileting: Contact Guard/Touching assist     Transfers Chair/bed transfer  Transfers assist     Chair/bed transfer assist level: Supervision/Verbal cueing     Locomotion Ambulation   Ambulation assist      Assist level: Supervision/Verbal cueing Assistive device: Walker-rolling Max distance: 250   Walk 10 feet activity   Assist     Assist level: Minimal Assistance - Patient > 75% Assistive device: No Device   Walk 50 feet activity   Assist    Assist level: Minimal Assistance -  Patient > 75% Assistive device: No Device    Walk 150 feet activity   Assist Walk 150 feet activity did not occur: Safety/medical concerns         Walk 10 feet on uneven surface  activity   Assist     Assist level: Minimal Assistance - Patient > 75%     Wheelchair     Assist Is the patient using a wheelchair?: Yes Type of Wheelchair: Manual    Wheelchair assist level: Dependent - Patient 0% Max wheelchair distance: 150'    Wheelchair 50 feet with 2 turns activity    Assist        Assist Level: Dependent - Patient 0%   Wheelchair 150 feet activity     Assist      Assist Level: Dependent - Patient 0%   Blood pressure (!) 169/87, pulse 72, temperature 98.4 F (36.9 C), temperature source Oral, resp. rate 18, height 5' 1 (1.549 m), weight 78.1 kg, SpO2 95%.  Medical Problem List and Plan: 1. 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  Antithrombotics: -DVT/anticoagulation:  Pharmaceutical: Other (comment) Pradaxa              -antiplatelet therapy: N/A 3. Pain Management: Oxycodone  5 mg every 4 hours as needed,  tramadol  for moderate pain              - Pt reports she was previously discharged from [facility] pain clinic after oxycodone  was not found in UDS Will d/c hydrocodone  and tramadol  prior to d/c , used tylenol  and NSAID PTA for pain , would rec tylenol  only  Currently using ~1 tramadol  per day and 2 hydrocodone  - will not have as much activity post d/c   - Increase gabapentin  to 300 mg 3 times daily  -7/27 having headaches, ?migrainous   -has used imitrex  remotely. Will rx and see if it helps    -imitrex  50mg  po prn   -was on higher dose of gabapentin  (300mg  tid) previously.  Might be beneficial for headaches and FMS 4. Mood/Behavior/Sleep: BuSpar  15 mg twice daily, Cymbalta  90 mg daily, melatonin 5 mg nightly as needed             -antipsychotic agents: N/A 5. Neuropsych/cognition: This patient is capable of making decisions on her own behalf. 6. Skin/Wound Care: Routine skin checks No scalp lesions to correlate with area of mild tenderness to palpation left of vertex, MRI showing no skull or scalp abnormalities  7. Fluids/Electrolytes/Nutrition: Routine in and outs with follow-up chemistries 8.  Hyperlipidemia.  Lipitor /Zetia  9.  GERD.  Protonix  10. Atrial fibrillation.  Continue Pradaxa .  Cardiac rate controlled. But HRs creeping up to 90-100 resume metoprolol , flecainide on hold for now  11.  Prediabetes.  Hemoglobin A1c 6.0.  Currently on a regular diet.  CBGs discontinued 12. Obesity.  Dietary counseling             Body mass index is 31.95 kg/m. 13, Rheumatoid arthritis/Fibromyalgia              -Continue Cymbalta , pain control             -She says was on methotrexate in the past but this was stopped by her rheumatologist about 6 months ago, states she has been on Orencia              -She was on gabapentin  300mg  TID before admission, Restart at lower dose 100mg  TID for now, could consider increasing back up at a later time- see No 3, increased gabapentin  dose 14.  Hypertension.  Resume metoprolol .Improved HR 76-80bpm  Vitals:   11/07/23 2026 11/08/23 0413  BP: (!) 160/76 (!) 169/87  Pulse: 81 72  Resp: 19 18  Temp: 98 F (36.7 C) 98.4 F (36.9 C)  SpO2: 97% 95%   7/27 bp's higher over weekend, ?d/t h/a.     -rx headaches and obsv   -could also increase metoprolol  to 50mg   15. Constipation, Continue MiraLAX  daily and sorbitol  as needed             - Patient got sorbitol  today, consider additional medication tomorrow if no BM  - 7/24 LBM 16. Azotemia, push fluids. ---recheck Monday     Latest Ref Rng & Units 11/03/2023    9:12 AM 10/29/2023     4:40 AM 10/25/2023   10:17 PM  BMP  Glucose 70 - 99 mg/dL 857  887  88   BUN 8 - 23 mg/dL 20  27  18    Creatinine 0.44 - 1.00 mg/dL 9.19  8.91  8.99   Sodium 135 - 145 mmol/L 135  137  141   Potassium 3.5 - 5.1 mmol/L 4.2  4.1  4.0   Chloride 98 - 111 mmol/L 101  104  107   CO2 22 - 32 mmol/L 25  24    Calcium  8.9 - 10.3 mg/dL 9.1  8.8      17. Anemia mild, recheck stable     Latest Ref Rng & Units 11/03/2023    4:51 AM 10/29/2023    4:40 AM 10/25/2023   10:17 PM  CBC  WBC 4.0 - 10.5 K/uL 6.7  7.8    Hemoglobin 12.0 - 15.0 g/dL 88.3  88.3  87.7   Hematocrit 36.0 - 46.0 % 36.8  36.1  36.0   Platelets 150 - 400 K/uL 235  245             LOS: 11 days A FACE TO FACE EVALUATION WAS PERFORMED  Holly Henry 11/08/2023, 9:06 AM

## 2023-11-08 NOTE — Progress Notes (Signed)
 Occupational Therapy Session Note  Patient Details  Name: Holly Henry MRN: [id number] Date of Birth: 11/07/1955  Today's Date: 11/08/2023 OT Individual Time: 9068-8984 OT Individual Time Calculation (min): 44 min    Short Term Goals: Week 2:  OT Short Term Goal 1 (Week 2): STG=LTG d/t ELOS  Skilled Therapeutic Interventions/Progress Updates:     Pt received semi-reclined in bed, dressed for the day with all ADL needs met. Pt presenting to be in good spirits receptive to skilled OT session reporting 4/10 pain in neck/shoulders and head ache- OT offering intermittent rest breaks, repositioning, and therapeutic support to optimize participation in therapy session. Pt provided with medications prior to OT session. Focused this session on [facility] and working R UE functional use for increased independence in ADLs. Pt transitioned to EOB SUP and completed functional mobility to therapy gym no AD with distant SUP using rollator- no LOB noted and improved recall of need to lock breaks prior to standing/sitting noted. Pt completed 9-hole peg test (9HPT) sitting at table to assess [facility], speed, and dexterity in B UE. Pt unable to complete assessment during initial evaluation, however she has demonstrated improved FMC during LOS with improved pincers grasp and in-hand manipulation skills. Pt scored Right: 63 sec Left: 41 sec. Pt with decreased FMC in R hand, however improved from initial eval. Pt completed FMC activity to work on translation and isolated use of thumb and index finger- instructed to remove and then place nuts/bolts of different sizes into wooden board. Pt able to complete activity with increased time +min verbal cues required to avoid compensatory pinch patterns. Pt then completed table top card sorting activity while seated sorting cards into stacks, shuffling cards, and flipping cards to work on translation skills and maintained pinch- she was able to complete activity with min dropping +increased  time. Pt completed functional mobility back to her room using rollator with distant SUP. She returned to bed SUP. Pt was left resting in bed with call bell in reach, bed alarm on, and all needs met.    Therapy Documentation Precautions:  Precautions Precautions: Fall, Other (comment) Recall of Precautions/Restrictions: Intact Precaution/Restrictions Comments: watch bp Restrictions Weight Bearing Restrictions Per Provider Order: No   Therapy/Group: Individual Therapy  Katheryn SHAUNNA [place] 11/08/2023, 7:53 AM

## 2023-11-09 MED ORDER — TOPIRAMATE 25 MG PO TABS
25.0000 mg | ORAL_TABLET | Freq: Two times a day (BID) | ORAL | Status: DC
  Administered 2023-11-09 – 2023-11-11 (×5): 25 mg via ORAL
  Filled 2023-11-09 (×5): qty 1

## 2023-11-09 MED ORDER — METOPROLOL SUCCINATE ER 50 MG PO TB24
50.0000 mg | ORAL_TABLET | Freq: Every day | ORAL | Status: DC
  Administered 2023-11-10 – 2023-11-11 (×2): 50 mg via ORAL
  Filled 2023-11-09 (×2): qty 1

## 2023-11-09 NOTE — Progress Notes (Signed)
 Patient ID: Holly Henry, female   DOB: 10-15-1955, 68 y.o.   MRN: [id number] Gave pt co-pay information for [employer] for equipment. She will follow up with

## 2023-11-09 NOTE — Progress Notes (Signed)
 Physical Therapy Session Note  Patient Details  Name: Holly Henry MRN: [id number] Date of Birth: Oct 12, 1955  Today's Date: 11/09/2023 PT Individual Time: 1127-1220 PT Individual Time Calculation (min): 53 min   Short Term Goals: Week 1:  PT Short Term Goal 1 (Week 1): Pt willl complete bed mobility with CGA PT Short Term Goal 1 - Progress (Week 1): Met PT Short Term Goal 2 (Week 1): Pt will complete bed to chair with CGA PT Short Term Goal 2 - Progress (Week 1): Met PT Short Term Goal 3 (Week 1): Pt will ambulate x150' with CGA and LRAD PT Short Term Goal 3 - Progress (Week 1): Met PT Short Term Goal 4 (Week 1): Pt will improve Berg by MCID Week 2:  PT Short Term Goal 1 (Week 2): STG = LTG d/t ELOS  Skilled Therapeutic Interventions/Progress Updates:  Patient supine in bed on entrance to room. Patient alert and agreeable to PT session. Dtr present for family education.   Patient with no pain complaint at start of session. Feels significant improvement from earlier migraine headache.   Dtr educated that pt has made significant progress since [place] and does not require physical assist at this time. Also educated that pt's comorbidities, especially RA, may impact pt's strength especially in RUE/ RLE. Give pt time to complete tasks in home as it may take her longer than in past.   Therapeutic Activity: Bed Mobility: Pt performed supine <> sit with Mod I. No cueing required for technique. Also performed in ADL apartment bed with Mod I and no need for cueing.  Transfers: Pt performed sit<>stand and stand pivot transfers throughout session with supervision. Educated dtr on how to provide assist if pt is having a bad mobility day d/t RA/ pain. Dtr is able to return demonstrate.   Discussed pt's obtaining shower chair and dtr unsure if shower chair will fit in shower or tub. Pt relates need to sit until brief stands to fully cleanse self. If stool does not fit in tub or shower then pt relates  ability to utilize while seated with shower seat positioned lengthwise. Dtr in agreement.   Car transfer performed to dtr's car height and to other dtr's car height which are very different. Able to performt o both heights with supervision. VC not to use door for balance. If pt wth difficulty in taller car, can get low step/ stool to assist with backward boost into seat. Dtr in agreement.   Gait Training:  Pt ambulated 200' x2 using rollator with supervision and then without rollator and dtr providing CGA.  Demonstrated stair navigation to steps setup as per home entry. Dtr provides pictures of entries for safe options. Pt is able to demo while providing explanation of steps to dtr throughout. Educated dtr on potential need for guarding or assist with AD mgmt if pt feeling weak/ painful. If pt requires guarding to remain one step below pt whether ascending or descending. Dtr demos understanding.   Dtr impressed with pt's progress.   Patient seated upright on EOB at end of session with brakes locked, bed alarm set, and all needs within reach.   Therapy Documentation Precautions:  Precautions Precautions: Fall, Other (comment) Recall of Precautions/Restrictions: Intact Precaution/Restrictions Comments: watch bp Restrictions Weight Bearing Restrictions Per Provider Order: No  Pain: Pain Assessment Pain Scale: 0-10 Pain Score: 0-No pain    Therapy/Group: Individual Therapy  [name] PT, DPT, CSRS 11/09/2023, 1:31 PM

## 2023-11-09 NOTE — Progress Notes (Signed)
 Occupational Therapy Session Note  Patient Details  Name: Holly Henry MRN: [id number] Date of Birth: Sep 14, 1955  Today's Date: 11/09/2023 OT Individual Time: 9064-8955 OT Individual Time Calculation (min): 69 min  Session 2:  OT Individual Time: [phone] OT Individual Time Calculation (min): 40 min   Short Term Goals: Week 2:  OT Short Term Goal 1 (Week 2): STG=LTG d/t ELOS  Skilled Therapeutic Interventions/Progress Updates:     AM Session:  Pt received semi-reclined in bed dressed for the day presenting to be in good spirits receptive to skilled OT session reporting 7/10 pain in shoulders- OT offering intermittent rest breaks, repositioning, and therapeutic support to optimize participation in therapy session. Focused this session on HEP education and dual tasking for increased safety and independence in ADLs.   Pt completed functional mobility to therapy gym using rollator with distance SUP, no LOB noted.  Pt reporting pain and stiffness in neck/shoulders. Provided head pack with application to upper neck and back. Engaged Pt in completing gentle neck/trunk stretches to decrease pain and muscle stiffness with noted improvement following. Issued Pt HEP of the exercises to support carryover at d/c. Pt completed the following exercises with OT providing verbal cues on technique and visual model to support learning:  - Seated Cervical Sidebending AROM  - 1 x daily - 7 x weekly - 1-2 sets - 5 reps - Seated Cervical Flexion Stretch with Finger Support Behind Neck  - 1 x daily - 7 x weekly - 1-2 sets - 5 reps - Seated Cervical Rotation AROM  - 1 x daily - 7 x weekly - 1-2 sets - 5 reps - Neck Rotations in Sitting  - 1 x daily - 7 x weekly - 1-2 sets - 5 reps - Seated Shoulder Shrug Circles AROM Forward  - 1 x daily - 7 x weekly - 1-2 sets - 10 reps - Seated Shoulder Shrug Circles AROM Backward  - 1 x daily - 7 x weekly - 1-2 sets - 10 reps - Seated Cat Cow  - 1 x daily - 7 x weekly - 3 sets -  10 reps - Spine Rotation in Chair  - 1 x daily - 7 x weekly - 2 sets - 8 reps - Seated Sidebending Arms Overhead  - 1 x daily - 7 x weekly - 3 sets - 10 reps  Pt completed dual tasking activity tossing 2.2# weighted ball while completing functional mobility through hallway to work on B U/LE coordination and dual tasking for increased safety during ADLs. Pt able to complete 2 trials of walking forwards with CGA provided for safety, no LOB noted. Slowed gait speed during activity d/t physical/cognitive challenge of task. When returning to [facility] for seated rest break, engaged Pt in walking backwards without throwing ball to work on overall self-confidence and decrease fear of falling- she was able to complete 2 trials with CGA, no LOB and verbal cues provided for step width.   Pt completed functional mobility back to room using rollator with distant SUP. Pt was left resting in bed with call bell in reach, bed alarm on, and all needs met.    PM Session:  Pt received semi-reclined in bed with DTR present in room for family education focused session. Pt presenting to be in good spirits receptive to skilled OT session reporting 0/10 pain- OT offering intermittent rest breaks, repositioning, and therapeutic support to optimize participation in therapy session. Provided education on fall prevention, energy conservation, and simple home modifications to  increase Pt's safety/accessibility. Education provided on CVA etiology, recovery process, and BE-FAST sign/symptom recommendations. Recommending Pt receive OPOT services at d/c, intermittent SUP for IADLs, and use of shower seat at d/c. Provided education on Pt's current functional status and that with use of rollator, she will be d/c'ing at a MOD I/SUP level for ADLs with recommendations to provide SUP during bathing and toileting at d/c. Engaged Pt's DTR in assisting Pt with shower transfers and having Pt step over edge of tub with Pt's DTR demonstrating appropriate  safety awareness and insight into Pt's deficits. In therapy kitchen, engaged Pt in simulating IADL tasks and provided education on safety considerations and rollator positioning. Pt and Pt's DTR receptive to all education provided reporting no further questions at end of session. Pt was left resting in bed with call bell in reach and all needs met.    Therapy Documentation Precautions:  Precautions Precautions: Fall, Other (comment) Recall of Precautions/Restrictions: Intact Precaution/Restrictions Comments: watch bp Restrictions Weight Bearing Restrictions Per Provider Order: No   Therapy/Group: Individual Therapy  Katheryn SHAUNNA [place] 11/09/2023, 7:58 AM

## 2023-11-09 NOTE — Progress Notes (Signed)
 PROGRESS NOTE   Subjective/Complaints: C/o of headache all weekend. Came on yesterday morning. Has a history of migraines. Taking tramadol  and hydrocodone  for h/a with some relief.Discussed recs to minimize opioid use post CVA d/t fall risk .  Had good relief with sumatriptan  this am Discussed that triptans are not recommended after CVA And will d/c.  Also will trial topiramate    ROS: Patient denies CP, SOB, N/V/D, no HA at present    Objective:   No results found.  No results for input(s): WBC, HGB, HCT, PLT in the last 72 hours.   No results for input(s): NA, K, CL, CO2, GLUCOSE, BUN, CREATININE, CALCIUM  in the last 72 hours.    Intake/Output Summary (Last 24 hours) at 11/09/2023 0841 Last data filed at 11/09/2023 0651 Gross per 24 hour  Intake 592 ml  Output --  Net 592 ml        Physical Exam: Vital Signs Blood pressure (!) 151/88, pulse 95, temperature 98.2 F (36.8 C), resp. rate 16, height 5' 1 (1.549 m), weight 78.1 kg, SpO2 95%.   General: No acute distress Mood and affect are appropriate Heart: Regular rate and rhythm no rubs murmurs or extra sounds Lungs: Clear to auscultation, breathing unlabored, no rales or wheezes Abdomen: Positive bowel sounds, soft nontender to palpation, nondistended Extremities: No clubbing, cyanosis, or edema  Skin: No evidence of breakdown, no evidence of rash, scalp is clear mild tenderness just left of midline near vertex Neurologic: Cranial nerves II through XII intact, motor strength is 5/5 in left and 3-/5 right deltoid, bicep, tricep, grip, 5/5 left and 4/5 right hip flexor, knee extensors, ankle dorsiflexor and plantar flexor Sensory exam normal sensation to light touch in bilateral upper and lower extremities Cerebellar exam normal finger to nose to finger Musculoskeletal: 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 Prior neuro assessment is c/w 11/09/2023 exam.   Assessment/Plan: 1. Functional deficits which require 3+ hours per day of interdisciplinary therapy in a comprehensive inpatient rehab setting. Physiatrist is providing close team supervision and 24 hour management of active medical problems listed below. Physiatrist and rehab team continue to assess barriers to discharge/monitor patient progress toward functional and medical goals  Care Tool:  Bathing    Body parts bathed by patient: Chest, Abdomen, Front perineal area, Right upper leg, Left upper leg, Left arm, Right arm, Right lower leg, Left lower leg, Face, Buttocks   Body parts bathed by helper: Right arm, Left arm, Chest, Abdomen, Right upper leg, Buttocks, Front perineal area, Left upper leg, Right lower leg, Left lower leg, Face Body parts n/a: Buttocks   Bathing assist Assist Level: Contact Guard/Touching assist     Upper Body Dressing/Undressing Upper body dressing   What is the patient wearing?: Pull over shirt    Upper body assist Assist Level: Supervision/Verbal cueing    Lower Body Dressing/Undressing Lower body dressing      What is the patient wearing?: Pants, Underwear/pull up     Lower body assist Assist for lower body dressing: Contact Guard/Touching assist  Toileting Toileting    Toileting assist Assist for toileting: Contact Guard/Touching assist     Transfers Chair/bed transfer  Transfers assist     Chair/bed transfer assist level: Supervision/Verbal cueing     Locomotion Ambulation   Ambulation assist      Assist level: Supervision/Verbal cueing Assistive device: Walker-rolling Max distance: 250   Walk 10 feet activity   Assist     Assist level: Minimal Assistance - Patient > 75% Assistive device: No Device   Walk 50 feet activity   Assist    Assist level: Minimal Assistance -  Patient > 75% Assistive device: No Device    Walk 150 feet activity   Assist Walk 150 feet activity did not occur: Safety/medical concerns         Walk 10 feet on uneven surface  activity   Assist     Assist level: Minimal Assistance - Patient > 75%     Wheelchair     Assist Is the patient using a wheelchair?: Yes Type of Wheelchair: Manual    Wheelchair assist level: Dependent - Patient 0% Max wheelchair distance: 150'    Wheelchair 50 feet with 2 turns activity    Assist        Assist Level: Dependent - Patient 0%   Wheelchair 150 feet activity     Assist      Assist Level: Dependent - Patient 0%   Blood pressure (!) 151/88, pulse 95, temperature 98.2 F (36.8 C), resp. rate 16, height 5' 1 (1.549 m), weight 78.1 kg, SpO2 95%.  Medical Problem List and Plan: 1. 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  Antithrombotics: -DVT/anticoagulation:  Pharmaceutical: Other (comment) Pradaxa              -antiplatelet therapy: N/A 3. Pain Management: Oxycodone  5 mg every 4 hours as needed,  tramadol  for moderate pain              - Pt reports she was previously discharged from [facility] pain clinic after oxycodone  was not found in UDS Will d/c hydrocodone  and tramadol  prior to d/c , used tylenol  and NSAID PTA for pain , would rec tylenol  only  Currently using ~1 tramadol  per day and 2 hydrocodone  - will not have as much activity post d/c   - Increase gabapentin  to 300 mg 3 times daily  -7/28 having headaches, ?migrainous- start topiramate    -has used imitrex  remotely. Contraindicated for CVA       -was on higher dose of gabapentin  (300mg  tid) previously. Might be beneficial for headaches  and FMS 4. Mood/Behavior/Sleep: BuSpar  15 mg twice daily, Cymbalta  90 mg daily, melatonin 5 mg nightly as needed             -antipsychotic agents: N/A 5. Neuropsych/cognition: This patient is capable of making decisions on her own behalf. 6. Skin/Wound Care: Routine skin checks No scalp lesions to correlate with area of mild tenderness to palpation left of vertex, MRI showing no skull or scalp abnormalities  7. Fluids/Electrolytes/Nutrition: Routine in and outs with follow-up chemistries 8.  Hyperlipidemia.  Lipitor /Zetia  9.  GERD.  Protonix  10. Atrial fibrillation.  Continue Pradaxa .  Cardiac rate controlled. But HRs creeping up to 90-100 resume metoprolol , flecainide on hold for now  11.  Prediabetes.  Hemoglobin A1c 6.0.  Currently on a regular diet.  CBGs discontinued 12. Obesity.  Dietary counseling             Body mass index is 31.95 kg/m. 13, Rheumatoid arthritis/Fibromyalgia              -Continue Cymbalta , pain control             -She says was on methotrexate in the past but this was stopped by her rheumatologist about 6 months ago, states she has been on Orencia              -She was on gabapentin  300mg  TID before admission, Restart at lower dose 100mg  TID for now, could consider increasing back up at a later time- see No 3, increased gabapentin  dose 14.  Hypertension.  Resume metoprolol .Improved HR 76-80bpm  Vitals:   11/08/23 1929 11/09/23 0424  BP: (!) 153/83 (!) 151/88  Pulse: 76 95  Resp: 12 16  Temp: 98.6 F (37 C) 98.2 F (36.8 C)  SpO2: 97% 95%   increase metoprolol  to 50mg   15. Constipation, Continue MiraLAX  daily and sorbitol  as needed             - Patient got sorbitol  today, consider additional medication tomorrow if no BM  - 7/24 LBM 16. Azotemia, push fluids. ---recheck Monday     Latest Ref Rng & Units 11/03/2023    9:12 AM 10/29/2023    4:40 AM 10/25/2023   10:17 PM  BMP  Glucose 70 - 99 mg/dL 857  887  88   BUN 8 - 23 mg/dL 20  27  18    Creatinine  0.44 - 1.00 mg/dL 9.19  8.91  8.99   Sodium 135 - 145 mmol/L 135  137  141   Potassium 3.5 - 5.1 mmol/L 4.2  4.1  4.0   Chloride 98 - 111 mmol/L 101  104  107   CO2 22 - 32 mmol/L 25  24    Calcium  8.9 - 10.3 mg/dL 9.1  8.8      17. Anemia mild, recheck stable     Latest Ref Rng & Units 11/03/2023    4:51 AM 10/29/2023    4:40 AM 10/25/2023   10:17 PM  CBC  WBC 4.0 - 10.5 K/uL 6.7  7.8    Hemoglobin 12.0 - 15.0 g/dL 88.3  88.3  87.7   Hematocrit 36.0 - 46.0 % 36.8  36.1  36.0   Platelets 150 - 400 K/uL 235  245             LOS: 12 days A FACE TO FACE EVALUATION WAS PERFORMED  Holly Henry 11/09/2023, 8:41 AM

## 2023-11-09 NOTE — Progress Notes (Signed)
 Physical Therapy Session Note  Patient Details  Name: Holly Henry MRN: [id number] Date of Birth: [birth year]  Today's Date: 11/09/2023 PT Individual Time: 0802-0904 PT Individual Time Calculation (min): 62 min   Short Term Goals: Week 1:  PT Short Term Goal 1 (Week 1): Pt willl complete bed mobility with CGA PT Short Term Goal 1 - Progress (Week 1): Met PT Short Term Goal 2 (Week 1): Pt will complete bed to chair with CGA PT Short Term Goal 2 - Progress (Week 1): Met PT Short Term Goal 3 (Week 1): Pt will ambulate x150' with CGA and LRAD PT Short Term Goal 3 - Progress (Week 1): Met PT Short Term Goal 4 (Week 1): Pt will improve Berg by MCID Week 2:  PT Short Term Goal 1 (Week 2): STG = LTG d/t ELOS  Skilled Therapeutic Interventions/Progress Updates:  Patient supine in bed on entrance to room. Patient alert and agreeable to PT session.   Patient with mild pain complaint at start of session. Relates waking with migraine headache but has received medication and headache pain is improving.   Therapeutic Activity: Bed Mobility: Pt performed supine > sit with Mod I. Is able to change shorts to pants while seated EOB with supervision. Rollator used to assist with balance intermittently.  Transfers: Pt performed sit<>stand and stand pivot transfers throughout session with and without use of rollator and supervision. Provided intermittent vc/ tc for ensuring rollator brakes locked prior to all transitions.  Pt guided in continuous reciprocation of BLE only L3 x initially and then adding BUE using NuStep L3 x with focus on maintaining effort with pace partner. Completes bout reaching 0.85mi over 610 steps, averaging 58 steps/ min at 1.7 METs.  Gait Training/ NMR:  Pt ambulated 175' x2/ 262' x1 ft using rollator with supervision and 115' x1 without AD and CGA/ supervision. Pt guided in short distance ambulation while carrying tidal tank over 100' x1 and maintaining level tank.  Demonstrated significant slowing of pace with focus on maintaining level tank. VC provided to increase pace and maintain level gaze after 15 ft and attempt to maintain with proprioception.  NMR performed for improvements in motor control and coordination, balance, sequencing, judgement, and self confidence/ efficacy in performing all aspects of mobility at highest level of independence.   Pt relates dtr coming for family education in later session and wants to practice steps into home with rollator. Pt is able to perform well with supervision and is also able to relate steps of navigation to therapist as though explaining to dtr.   Patient seated upright in EOB at end of session with brakes locked, bed alarm set, and all needs within reach. Related need to discuss pt's Mod I status in room with OT prior to initiating.    Therapy Documentation Precautions:  Precautions Precautions: Fall, Other (comment) Recall of Precautions/Restrictions: Intact Precaution/Restrictions Comments: watch bp Restrictions Weight Bearing Restrictions Per Provider Order: No  Pain: Pain Assessment Pain Scale: 0-10 Pain Score: 0-No pain    Therapy/Group: Individual Therapy  [name] PT, DPT, CSRS 11/09/2023, 1:24 PM

## 2023-11-09 NOTE — Discharge Instructions (Signed)
 Inpatient Rehab Discharge Instructions  Jalacia Mattila Discharge date and time: 11/11/23 9:45 AM   Activities/Precautions/ Functional Status: Activity: no lifting, driving, or strenuous exercise for until cleared by MD  Diet: diabetic diet Wound Care: none needed Functional status:  ___ No restrictions     ___ Walk up steps independently ___ 24/7 supervision/assistance   ___ Walk up steps with assistance ___ Intermittent supervision/assistance  ___ Bathe/dress independently __x_ Walk with walker    ___ Bathe/dress with assistance ___ Walk Independently    ___ Shower independently ___ Walk with assistance    ___ Shower with assistance __x_ No alcohol     ___ Return to work/school ________  Special Instructions:    My questions have been answered and I understand these instructions. I will adhere to these goals and the provided educational materials after my discharge from the hospital.  Patient/Caregiver Signature _______________________________ Date __________  Clinician Signature _______________________________________ Date __________  Please bring this form and your medication list with you to all your follow-up doctor's appointments.      COMMUNITY REFERRALS UPON DISCHARGE:     Outpatient: PT   &  OT             Agency:[facility]  [address] [place] Phone:[phone]              Appointment Date/Time:WILL CALL TO SET UP FOLLOW UP APPOINTMENTS  Medical Equipment/Items Ordered:SHOWER SEAT AND ROLLATOR                                                 Agency/Supplier:[facility]  [phone]

## 2023-11-09 NOTE — Progress Notes (Signed)
 2107, prn norco given for complaint of shoulder pain. At 2130- Dressing changed to sacrum. At 0055, PRN ultram  and melatonin given. Continent of B & B. LBM 07/26.Zoiee Wimmer A

## 2023-11-09 NOTE — Plan of Care (Signed)
  Problem: Education: Goal: Utilization of techniques to improve thought processes will improve Outcome: Progressing   Problem: Health Behavior/Discharge Planning: Goal: Compliance with therapeutic regimen will improve Outcome: Progressing

## 2023-11-10 MED ORDER — LATANOPROST 0.005 % OP SOLN
1.0000 [drp] | Freq: Every day | OPHTHALMIC | 12 refills | Status: AC
  Filled 2023-11-10: qty 2.5, 25d supply, fill #0

## 2023-11-10 MED ORDER — TRIAMCINOLONE ACETONIDE 0.1 % EX CREA
TOPICAL_CREAM | Freq: Every day | CUTANEOUS | 0 refills | Status: AC
  Filled 2023-11-10: qty 30, 14d supply, fill #0

## 2023-11-10 MED ORDER — DULOXETINE HCL 30 MG PO CPEP
90.0000 mg | ORAL_CAPSULE | Freq: Every day | ORAL | 0 refills | Status: AC
  Filled 2023-11-10: qty 90, 30d supply, fill #0

## 2023-11-10 MED ORDER — TOPIRAMATE 25 MG PO TABS
25.0000 mg | ORAL_TABLET | Freq: Two times a day (BID) | ORAL | 0 refills | Status: AC
Start: 2023-11-10 — End: ?
  Filled 2023-11-10: qty 60, 30d supply, fill #0

## 2023-11-10 MED ORDER — MELATONIN 5 MG PO TABS
5.0000 mg | ORAL_TABLET | Freq: Every evening | ORAL | 0 refills | Status: AC | PRN
  Filled 2023-11-10: qty 30, 30d supply, fill #0

## 2023-11-10 MED ORDER — ACETAMINOPHEN 325 MG PO TABS: 650.0000 mg | ORAL_TABLET | Freq: Four times a day (QID) | ORAL | Status: AC | PRN

## 2023-11-10 MED ORDER — ALBUTEROL SULFATE HFA 108 (90 BASE) MCG/ACT IN AERS
1.0000 | INHALATION_SPRAY | RESPIRATORY_TRACT | 0 refills | Status: AC | PRN
  Filled 2023-11-10: qty 18, 17d supply, fill #0

## 2023-11-10 MED ORDER — POLYETHYLENE GLYCOL 3350 17 GM/SCOOP PO POWD
17.0000 g | Freq: Every day | ORAL | 0 refills | Status: AC
  Filled 2023-11-10: qty 238, 14d supply, fill #0

## 2023-11-10 MED ORDER — METOPROLOL SUCCINATE ER 50 MG PO TB24
50.0000 mg | ORAL_TABLET | Freq: Every day | ORAL | 0 refills | Status: AC
  Filled 2023-11-10: qty 30, 30d supply, fill #0

## 2023-11-10 MED ORDER — METHENAMINE MANDELATE 0.5 G PO TABS
500.0000 mg | ORAL_TABLET | Freq: Two times a day (BID) | ORAL | 0 refills | Status: AC
Start: 2023-11-10 — End: ?
  Filled 2023-11-10: qty 60, 30d supply, fill #0

## 2023-11-10 MED ORDER — DABIGATRAN ETEXILATE MESYLATE 150 MG PO CAPS
150.0000 mg | ORAL_CAPSULE | Freq: Two times a day (BID) | ORAL | 0 refills | Status: AC
  Filled 2023-11-10: qty 60, 30d supply, fill #0

## 2023-11-10 MED ORDER — TIZANIDINE HCL 2 MG PO TABS
ORAL_TABLET | ORAL | 0 refills | Status: AC
  Filled 2023-11-10: qty 30, 6d supply, fill #0

## 2023-11-10 NOTE — Progress Notes (Signed)
 PROGRESS NOTE   Subjective/Complaints: Notes reduced HA on topiramate , slept well   Discussed d/c process for tomorrow  ROS: Patient denies CP, SOB, N/V/D, no HA at present    Objective:   No results found.  No results for input(s): WBC, HGB, HCT, PLT in the last 72 hours.   No results for input(s): NA, K, CL, CO2, GLUCOSE, BUN, CREATININE, CALCIUM  in the last 72 hours.   No intake or output data in the 24 hours ending 11/10/23 0807       Physical Exam: Vital Signs Blood pressure (!) 152/82, pulse 62, temperature 97.8 F (36.6 C), temperature source Oral, resp. rate 18, height 5' 1 (1.549 m), weight 78.1 kg, SpO2 96%.   General: No acute distress Mood and affect are appropriate Heart: Regular rate and rhythm no rubs murmurs or extra sounds Lungs: Clear to auscultation, breathing unlabored, no rales or wheezes Abdomen: Positive bowel sounds, soft nontender to palpation, nondistended Extremities: No clubbing, cyanosis, or edema  Skin: No evidence of breakdown, no evidence of rash, scalp is clear mild tenderness just left of midline near vertex Neurologic: Cranial nerves II through XII intact, motor strength is 5/5 in left and 4-/5 right deltoid, bicep, tricep, grip, 5/5 left and 4/5 right hip flexor, knee extensors, ankle dorsiflexor and plantar flexor Sensory exam normal sensation to light touch in bilateral upper and lower extremities Cerebellar exam normal finger to nose to finger Musculoskeletal: 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 Prior neuro assessment is c/w 11/10/2023 exam.   Assessment/Plan: 1. Functional deficits which require 3+ hours per day of interdisciplinary therapy in a comprehensive inpatient rehab setting. Physiatrist is providing close team  supervision and 24 hour management of active medical problems listed below. Physiatrist and rehab team continue to assess barriers to discharge/monitor patient progress toward functional and medical goals  Care Tool:  Bathing    Body parts bathed by patient: Chest, Abdomen, Front perineal area, Right upper leg, Left upper leg, Left arm, Right arm, Right lower leg, Left lower leg, Face, Buttocks   Body parts bathed by helper: Right arm, Left arm, Chest, Abdomen, Right upper leg, Buttocks, Front perineal area, Left upper leg, Right lower leg, Left lower leg, Face Body parts n/a: Buttocks   Bathing assist Assist Level: Contact Guard/Touching assist     Upper Body Dressing/Undressing Upper body dressing   What is the patient wearing?: Pull over shirt    Upper body assist Assist Level: Supervision/Verbal cueing    Lower Body Dressing/Undressing Lower body dressing      What is the patient wearing?: Pants, Underwear/pull up     Lower body assist Assist for lower body dressing: Contact Guard/Touching assist     Toileting Toileting    Toileting assist Assist for toileting: Contact Guard/Touching assist     Transfers Chair/bed transfer  Transfers assist     Chair/bed transfer assist level: Supervision/Verbal cueing     Locomotion Ambulation   Ambulation assist      Assist level: Supervision/Verbal cueing Assistive device: Walker-rolling Max distance: 250  Walk 10 feet activity   Assist     Assist level: Minimal Assistance - Patient > 75% Assistive device: No Device   Walk 50 feet activity   Assist    Assist level: Minimal Assistance - Patient > 75% Assistive device: No Device    Walk 150 feet activity   Assist Walk 150 feet activity did not occur: Safety/medical concerns         Walk 10 feet on uneven surface  activity   Assist     Assist level: Minimal Assistance - Patient > 75%     Wheelchair     Assist Is the patient using  a wheelchair?: Yes Type of Wheelchair: Manual    Wheelchair assist level: Dependent - Patient 0% Max wheelchair distance: 150'    Wheelchair 50 feet with 2 turns activity    Assist        Assist Level: Dependent - Patient 0%   Wheelchair 150 feet activity     Assist      Assist Level: Dependent - Patient 0%   Blood pressure (!) 152/82, pulse 62, temperature 97.8 F (36.6 C), temperature source Oral, resp. rate 18, height 5' 1 (1.549 m), weight 78.1 kg, SpO2 96%.  Medical Problem List and Plan: 1. 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  Antithrombotics: -DVT/anticoagulation:  Pharmaceutical: Other (comment) Pradaxa              -antiplatelet therapy: N/A 3. Pain Management: Oxycodone  5 mg every 4 hours as needed,  tramadol  for moderate pain              - Pt reports she was previously discharged from [facility] pain clinic after oxycodone  was not found in UDS Will d/c hydrocodone  and tramadol  prior to d/c , used tylenol  and NSAID PTA for pain , would rec tylenol  only  Currently using ~1 tramadol  per day and 2 hydrocodone  - will not have as much activity post d/c   - Increase gabapentin  to 300 mg 3 times daily  -7/29headaches improved on topiramate    -has used imitrex  remotely. Contraindicated for CVA       -was on higher dose of gabapentin  (300mg  tid) previously. Might be beneficial for headaches and FMS 4. Mood/Behavior/Sleep: BuSpar  15 mg twice daily, Cymbalta  90 mg daily, melatonin 5 mg nightly as needed             -antipsychotic agents: N/A 5. Neuropsych/cognition: This patient is capable of making decisions on her own behalf. 6. Skin/Wound Care: Routine skin checks No scalp lesions to correlate  with area of mild tenderness to palpation left of vertex, MRI showing no skull or scalp abnormalities  7. Fluids/Electrolytes/Nutrition: Routine in and outs with follow-up chemistries 8.  Hyperlipidemia.  Lipitor /Zetia  9.  GERD.  Protonix  10. Atrial fibrillation.  Continue Pradaxa .  Cardiac rate controlled. But HRs creeping up to 90-100 resume metoprolol , flecainide on hold for now  11.  Prediabetes.  Hemoglobin A1c 6.0.  Currently on a regular diet.  CBGs discontinued 12. Obesity.  Dietary counseling  Body mass index is 31.95 kg/m. 13, Rheumatoid arthritis/Fibromyalgia              -Continue Cymbalta , pain control             -She says was on methotrexate in the past but this was stopped by her rheumatologist about 6 months ago, states she has been on Orencia              -She was on gabapentin  300mg  TID before admission, Restart at lower dose 100mg  TID for now, could consider increasing back up at a later time- see No 3, increased gabapentin  dose 14.  Hypertension.  Resume metoprolol .Improved HR 76-80bpm  Vitals:   11/09/23 1944 11/10/23 0348  BP: 133/74 (!) 152/82  Pulse: 77 62  Resp: 18 18  Temp: 97.6 F (36.4 C) 97.8 F (36.6 C)  SpO2: 96% 96%   increase metoprolol  to 50mg   15. Constipation, Continue MiraLAX  daily and sorbitol  as needed             - Patient got sorbitol  today, consider additional medication tomorrow if no BM  - 7/24 LBM 16. Azotemia, push fluids. ---recheck Monday     Latest Ref Rng & Units 11/03/2023    9:12 AM 10/29/2023    4:40 AM 10/25/2023   10:17 PM  BMP  Glucose 70 - 99 mg/dL 857  887  88   BUN 8 - 23 mg/dL 20  27  18    Creatinine 0.44 - 1.00 mg/dL 9.19  8.91  8.99   Sodium 135 - 145 mmol/L 135  137  141   Potassium 3.5 - 5.1 mmol/L 4.2  4.1  4.0   Chloride 98 - 111 mmol/L 101  104  107   CO2 22 - 32 mmol/L 25  24    Calcium  8.9 - 10.3 mg/dL 9.1  8.8      17. Anemia mild, recheck stable     Latest Ref Rng & Units 11/03/2023    4:51  AM 10/29/2023    4:40 AM 10/25/2023   10:17 PM  CBC  WBC 4.0 - 10.5 K/uL 6.7  7.8    Hemoglobin 12.0 - 15.0 g/dL 88.3  88.3  87.7   Hematocrit 36.0 - 46.0 % 36.8  36.1  36.0   Platelets 150 - 400 K/uL 235  245             LOS: 13 days A FACE TO FACE EVALUATION WAS PERFORMED  Holly Henry 11/10/2023, 8:07 AM

## 2023-11-10 NOTE — Progress Notes (Signed)
 Patient ID: Holly Henry, female   DOB: 1956-02-26, 68 y.o.   MRN: [id number] Met with pt who reports her pastor feels she needs longer but he is actually in [place] and not seen her moving around. Discussed her being mod/I level and her insurance would not cover nor does she need sot be here longer and she is medically stable for discharge. She understands and wants worker to talk with pastor pt will set up with pastor and worker today. Pt made mod/I in room today. Daughter is not working and will be home with her also, although she needs no assist with her care

## 2023-11-10 NOTE — Progress Notes (Signed)
 Physical Therapy Discharge Summary  Patient Details  Name: Holly Henry MRN: [id number] Date of Birth: 01/11/1956  Date of Discharge from PT service:{Time; dates multiple:304500300}  {CHL IP REHAB PT TIME CALCULATION:304800500}   Patient has met {NUMBERS 0-12:18577} of {NUMBERS 0-12:18577} long term goals due to {due un:6958322}.  Patient to discharge at [facility] level {LOA:3049010}.   Patient's care partner {care partner:3041650} to provide the necessary {assistance:3041652} assistance at discharge.  Reasons goals not met: ***  Recommendation:  Patient will benefit from ongoing skilled PT services in {setting:3041680} to continue to advance safe functional mobility, address ongoing impairments in ***, and minimize fall risk.  Equipment: {equipment:3041657}  Reasons for discharge: {Reason for discharge:3049018}  Patient/family agrees with progress made and goals achieved: {Pt/Family agree with progress/goals:3049020}  PT Discharge Precautions/Restrictions   Vital Signs   Pain   Pain Interference   Vision/Perception     Cognition Orientation Level: Oriented X4 Sensation   Motor     Mobility   Locomotion     Trunk/Postural Assessment     Balance   Extremity Assessment            Holly Henry 11/10/2023, 8:24 AM

## 2023-11-10 NOTE — Progress Notes (Signed)
 Physical Therapy Session Note  Patient Details  Name: Holly Henry MRN: [id number] Date of Birth: [birth year]  {CHL IP REHAB PT TIME CALCULATION:304800500}  Short Term Goals: {DUH:6958314}  Skilled Therapeutic Interventions/Progress Updates:      Therapy Documentation Precautions:  Precautions Precautions: Fall, Other (comment) Recall of Precautions/Restrictions: Intact Precaution/Restrictions Comments: watch bp Restrictions Weight Bearing Restrictions Per Provider Order: No General:   Vital Signs:   Pain:   Mobility:   Locomotion :    Trunk/Postural Assessment :    Balance:   Exercises:   Other Treatments:      Therapy/Group: {Therapy/Group:3049007}  [name] 11/10/2023, 8:23 AM

## 2023-11-10 NOTE — Progress Notes (Signed)
 Inpatient Rehabilitation Care Coordinator Discharge Note   Patient Details  Name: Holly Henry MRN: [id number] Date of Birth: 1956-03-05   Discharge location: HOME WITH DAUGHTER AND GRANDCHILDREN  Length of Stay: 14 days  Discharge activity level: MOD/I LEVEL  Home/community participation: ACTIVE  Patient response un:Yzjouy Literacy - How often do you need to have someone help you when you read instructions, pamphlets, or other written material from your doctor or pharmacy?: Never  Patient response un:Dnrpjo Isolation - How often do you feel lonely or isolated from those around you?: Never  Services provided included: MD, RD, PT, OT, RN, CM, TR, Pharmacy, Neuropsych, SW  Financial Services:  Field seismologist Utilized: Private Insurance [employer] MEDICARE  Choices offered to/list presented to: PT  Follow-up services arranged:  Outpatient, DME, Patient/Family has no preference for HH/DME agencies    Outpatient Servicies: [facility] WILL CALL TO SET UP FOLLOW UP APPOINTMENTS DME : [facility]    Patient response to transportation need: Is the patient able to respond to transportation needs?: Yes In the past 12 months, has lack of transportation kept you from medical appointments or from getting medications?: No In the past 12 months, has lack of transportation kept you from meetings, work, or from getting things needed for daily living?: No   Patient/Family verbalized understanding of follow-up arrangements:  Yes  Individual responsible for coordination of the follow-up plan: SELF [phone]  Confirmed correct DME delivered: [name] 11/10/2023    Comments (or additional information):PT DID WELL AND RECOVERED FROM HER STROKE. HER DAUGHTER IS CURRENTLY NOT WORKING AND WILL BE HOME WITH ALONG WITH THREE GRANDCHILDREN  Summary of Stay    Date/Time Discharge Planning CSW  11/02/23 1011 Discharge home w dtr and three  grandchildren. Dtr not working (application for disability for self health issues). Anticipate DME needs and follow up services. DBS       Ocean Kearley G

## 2023-11-10 NOTE — Progress Notes (Signed)
 Occupational Therapy Discharge Summary  Patient Details  Name: Holly Henry MRN: [id number] Date of Birth: 23-May-1955  Date of Discharge from OT service:November 10, 2023  Today's Date: 11/10/2023 OT Individual Time: 9081-8984 OT Individual Time Calculation (min): 57 min  Session 2:  OT Individual Time: 8649-8551 OT Individual Time Calculation (min): 58 min   Patient has met 11 of 11 long term goals due to improved activity tolerance, improved balance, postural control, ability to compensate for deficits, functional use of  RIGHT upper and RIGHT lower extremity, improved attention, improved awareness, and improved coordination.  Patient to discharge at overall Modified Independent level for BADLs and SUP for shower transfers and higher level IADLs.  Patient's care partner is independent to provide the necessary physical assistance at discharge.    Reasons goals not met: All goals met  Recommendation:  Patient will benefit from ongoing skilled OT services in outpatient setting to continue to advance functional skills in the area of BADL, iADL, and Reduce care partner burden.  Equipment: Shower seat  Reasons for discharge: treatment goals met and discharge from hospital  Patient/family agrees with progress made and goals achieved: Yes  OT Discharge Skilled Therapeutic Interventions/Progress Updates:  AM Session:  Pt received semi-reclined in bed resting presenting to be in good spirits receptive to skilled OT session reporting 4/10 pain in shoulders and headache with medication provided prior to OT session- OT offering intermittent rest breaks, repositioning, and therapeutic support to optimize participation in therapy session. Spent time at beginning of session completing grad day activities and discussing plans for d/c day tomorrow. Focused this session on [facility] and IADL retraining.  Pt completed functional mobility to therapy gym using rollator mod I without LOB.   Pt completed 9-hole peg  test (9HPT) sitting at table to assess [facility], speed, and dexterity in B UE. Pt unable to complete assessment during initial evaluation, however she has demonstrated improved FMC during LOS with improved pincers grasp and in-hand manipulation skills. During assessment on 07/27, Pt scored Right: 63 sec Left: 41 sec. On reassessment this session, Pt scored Right: 58.33 sec Left: 45.09 sec. Pt with min improvement in R hand FMC and non-significant change in L hand FMC. Improved functional use of R UE overall during ADL/IADLs compared to initial eval.   Engaged Pt in completing simulated medication management activity using pill box to simulate organizing pill box with medications at home. Educated on importance of taking medication as directed with emphasis on time of day and dosage. Discussed contraindications of not taking medications specifically as directed with Pt verbalizing understanding. Pt tasked with organizing 4 medications (colored beads) into pill box as directed on label to set-up pill box for 1-week. Pt able to correctly follow instructions and organize medications with 100% accuracy without verbal cues required. For increased [facility] challenge, when placing beads back into correct bottle, Pt tasked with using tweezers to pick up and place beads- increased time and min dropping noted.   While standing at table top, no AD. Pt tasked with completing 3D geometric puzzle using small push together puzzle pieces to work on dual tasking and [facility] skills for increased independence in ADLs/IADLs. Pt able to tolerate standing for ~8 minutes during activity MOD I and complete puzzle with 75% accuracy. Seated rest break following. Pt able to utilize R UE at a dominant level during task.    Pt ambulated back to room using rollator mod I. Pt was left resting in bed with call bell in reach and  all needs met.    PM Session:  Pt received deeply sleeping in bed, waking upon OT arrival. Pt presenting to be in good  spirits receptive to skilled OT session reporting 0/10 pain- OT offering intermittent rest breaks, repositioning, and therapeutic support to optimize participation in therapy session. Pt requesting to take shower this PM. Focused this session on ADL retraining to increase Pt's independence in preparation for d/c. Pt completed ambulatory transfers throughout session using rollator mod I to both standard toilet and walk-in shower. 3/3 toileting tasks completed on standard toilet mod I- improved recall of need to lock rollator breaks prior to sitting noted. Majority of U/LB bathing completed on TTB for energy conservation using long handled sponge to wash B LEs and standing to wash buttocks while using grab bar, no LOB close SUP provided. Pt was able to dry self with SUP following shower without difficulty. She completed U/LB dressing sitting EOB donning [place] shirt, brief, socks, and shorts mod I using rollator when standing to bring pants to waist without LOB. Pt with appropriate activity pacing and application of energy conservation techniques this session. Pt was left resting in bed with call bell in reach and all needs met.   Precautions/Restrictions  Precautions Precautions: Fall Recall of Precautions/Restrictions: Intact Restrictions Weight Bearing Restrictions Per Provider Order: No Vital Signs Therapy Vitals Temp: 97.6 F (36.4 C) Pulse Rate: 63 Resp: 18 BP: 139/69 Patient Position (if appropriate): Lying Oxygen Therapy SpO2: 97 % O2 Device: Room Air Pain Pain Assessment Pain Scale: 0-10 Pain Score: 0-No pain ADL ADL Equipment Provided: Reacher Eating: Independent Where Assessed-Eating: Chair, Edge of bed Grooming: Modified independent Where Assessed-Grooming: Standing at sink Upper Body Bathing: Supervision/safety, Modified independent Where Assessed-Upper Body Bathing: Shower Lower Body Bathing: Supervision/safety Where Assessed-Lower Body Bathing: Shower Upper Body Dressing:  Independent Where Assessed-Upper Body Dressing: Edge of bed Lower Body Dressing: Modified independent Where Assessed-Lower Body Dressing: Edge of bed Toileting: Modified independent Where Assessed-Toileting: Teacher, adult education: Engineer, agricultural Method: Proofreader: Engineer, technical sales: Close supervison Web designer Method: Ship broker: Information systems manager without back, Insurance underwriter: Distant supervision Film/video editor Method: Designer, industrial/product: Emergency planning/management officer, Grab bars Vision Baseline Vision/History: 1 Wears glasses Patient Visual Report: No change from baseline Vision Assessment?: No apparent visual deficits Perception  Perception: Within Functional Limits Praxis Praxis: WFL Cognition Cognition Overall Cognitive Status: Within Functional Limits for tasks assessed Arousal/Alertness: Awake/alert Orientation Level: Person;Place;Situation Person: Oriented Place: Oriented Situation: Oriented Memory: Appears intact Sustained Attention: Appears intact Awareness: Appears intact Problem Solving: Appears intact Safety/Judgment: Appears intact Brief Interview for Mental Status (BIMS) Repetition of Three Words (First Attempt): 3 Temporal Orientation: Year: Correct Temporal Orientation: Month: Accurate within 5 days Temporal Orientation: Day: Correct Recall: Sock: Yes, no cue required Recall: Blue: Yes, no cue required Recall: Bed: Yes, no cue required BIMS Summary Score: 15 Sensation Sensation Light Touch: Appears Intact Hot/Cold: Appears Intact Proprioception: Appears Intact Coordination Gross Motor Movements are Fluid and Coordinated: Yes Fine Motor Movements are Fluid and Coordinated: No (bild coordination deficits 2/2 to RA; Mildly slowed FMC on R hemi-body) Finger Nose Finger Test: smooth equal movements bilaterally 9 Hole Peg  Test: L- 45.9 sec R-58.33 Motor  Motor Motor: Other (comment) Motor - Discharge Observations: Mild R hemiparesis U>LE Mobility  Bed Mobility Bed Mobility: Rolling Right;Rolling Left;Supine to Sit;Sit to Supine Rolling Right: Independent Rolling Left: Independent Supine to Sit: Independent Sit to Supine:  Independent Transfers Sit to Stand: Independent with assistive device Stand to Sit: Independent with assistive device  Trunk/Postural Assessment  Cervical Assessment Cervical Assessment: Exceptions to [facility] (forward head) Thoracic Assessment Thoracic Assessment: Exceptions to [facility] (rounded shoulders with kyphotic posturing) Lumbar Assessment Lumbar Assessment: Exceptions to [facility] (posterior pelvic tilt) Postural Control Postural Control: Deficits on evaluation Protective Responses: mild delay, improved from eval  Balance Balance Balance Assessed: Yes Static Sitting Balance Static Sitting - Balance Support: Feet unsupported Static Sitting - Level of Assistance: 7: Independent Dynamic Sitting Balance Dynamic Sitting - Balance Support: During functional activity Dynamic Sitting - Level of Assistance: 7: Independent Static Standing Balance Static Standing - Balance Support: During functional activity Static Standing - Level of Assistance: 6: Modified independent (Device/Increase time) Dynamic Standing Balance Dynamic Standing - Balance Support: During functional activity Dynamic Standing - Level of Assistance: 6: Modified independent (Device/Increase time) Extremity/Trunk Assessment RUE Assessment RUE Assessment: Exceptions to [facility] Passive Range of Motion (PROM) Comments: pain at shoulder during shoulder flexion/abduction so only assessed to 90* General Strength Comments: 4/5 overall LUE Assessment LUE Assessment: Within Functional Limits General Strength Comments: 4/5 d/t general deconditioning   Katheryn SQUIBB Woodson 11/10/2023, 2:21 PM

## 2023-11-10 NOTE — Plan of Care (Signed)
  Problem: RH Balance Goal: LTG Patient will maintain dynamic standing with ADLs (OT) Description: LTG:  Patient will maintain dynamic standing balance with assist during activities of daily living (OT)  Outcome: Completed/Met   Problem: Sit to Stand Goal: LTG:  Patient will perform sit to stand in prep for activites of daily living with assistance level (OT) Description: LTG:  Patient will perform sit to stand in prep for activites of daily living with assistance level (OT) Outcome: Completed/Met   Problem: RH Grooming Goal: LTG Patient will perform grooming w/assist,cues/equip (OT) Description: LTG: Patient will perform grooming with assist, with/without cues using equipment (OT) Outcome: Completed/Met   Problem: RH Bathing Goal: LTG Patient will bathe all body parts with assist levels (OT) Description: LTG: Patient will bathe all body parts with assist levels (OT) Outcome: Completed/Met   Problem: RH Dressing Goal: LTG Patient will perform upper body dressing (OT) Description: LTG Patient will perform upper body dressing with assist, with/without cues (OT). Outcome: Completed/Met Goal: LTG Patient will perform lower body dressing w/assist (OT) Description: LTG: Patient will perform lower body dressing with assist, with/without cues in positioning using equipment (OT) Outcome: Completed/Met   Problem: RH Toileting Goal: LTG Patient will perform toileting task (3/3 steps) with assistance level (OT) Description: LTG: Patient will perform toileting task (3/3 steps) with assistance level (OT)  Outcome: Completed/Met   Problem: RH Functional Use of Upper Extremity Goal: LTG Patient will use RT/LT upper extremity as a (OT) Description: LTG: Patient will use right/left upper extremity as a stabilizer/gross assist/diminished/nondominant/dominant level with assist, with/without cues during functional activity (OT) Outcome: Completed/Met   Problem: RH Simple Meal Prep Goal: LTG Patient  will perform simple meal prep w/assist (OT) Description: LTG: Patient will perform simple meal prep with assistance, with/without cues (OT). Outcome: Completed/Met   Problem: RH Toilet Transfers Goal: LTG Patient will perform toilet transfers w/assist (OT) Description: LTG: Patient will perform toilet transfers with assist, with/without cues using equipment (OT) Outcome: Completed/Met   Problem: RH Tub/Shower Transfers Goal: LTG Patient will perform tub/shower transfers w/assist (OT) Description: LTG: Patient will perform tub/shower transfers with assist, with/without cues using equipment (OT) Outcome: Completed/Met   

## 2023-11-11 ENCOUNTER — Other Ambulatory Visit (HOSPITAL_COMMUNITY): Payer: Self-pay

## 2023-11-11 NOTE — Progress Notes (Signed)
 Inpatient Rehabilitation Discharge Medication Review by a Pharmacist  A complete drug regimen review was completed for this patient to identify any potential clinically significant medication issues.  High Risk Drug Classes Is patient taking? Indication by Medication  Antipsychotic No   Anticoagulant Yes Dabigatran  - atrial fibrillation  Antibiotic Yes Methenamine  - UTI prophylaxis  Opioid No   Antiplatelet Yes Aspirin 81 mg - CAD  Hypoglycemics/insulin No   Vasoactive Medication Yes Metoprolol  succinate - atrial fibrillation Furosemide  - fluid balance  Chemotherapy No   Other Yes Atorvastatin , Ezetimibe , Repatha - hyperlipidemia Bimatoprost - glaucoma Buspirone  - anxiety  Duloxetine , Gabapentin  - fibromyalgia Pantoprazole  - GERD Orencia - rheumatoid arthriitis, fibromyalgia Topiramate  - headaches Triamcinolone  cream - rash Vitamin C , Magnesium, MVI - supplements Miralax  - laxative  PRNs: Acetaminophen  - mild pain, fever Albuterol  inhaler - wheezing, shortness of breath, cough Artificial Tears - dry eyes Diclofenac gel - topical pain reliever (arthritis) Melatonin - sleep Ondansetron  - nausea Tizanidine  - muscle spasms     Type of Medication Issue Identified Description of Issue Recommendation(s)  Drug Interaction(s) (clinically significant)     Duplicate Therapy  Latanoprost  substituted for Bimatoprost during inpatient and CIR admits, but both are on discharge med list. Back to Bimatoprost at discharge. Latanoprost  cancelled > NP discussed with patient.  Allergy     No Medication Administration End Date     Incorrect Dose     Additional Drug Therapy Needed     Significant med changes from prior encounter (inform family/care partners about these prior to discharge). Apixaban  changed to Dabigatran . Metoprolol  tartrate change to succinate (long-acting) Methenamine  dose decreased. Flecainide to remain on hold. Communicate changes with patient /family prior to  discharge.  Other       Clinically significant medication issues were identified that warrant physician communication and completion of prescribed/recommended actions by midnight of the next day:  Yes  Name of Provider notfied:  Daphne Finders, NP  Provider method of notification:  secure chat  Pharmacy comments:   - Latanoprost  substituted for Bimatoprost during inpatient and CIR admits > back to Bimatoprost at discharge.  NP discussed with patient and Latanoprost  returned to TOC.  Time spent performing this drug regimen review (minutes):  25 min    Holly Henry, [place] 11/11/2023 9:06 AM

## 2023-11-11 NOTE — Progress Notes (Signed)
 Wound supplies given to patient for discharge.

## 2023-11-11 NOTE — Progress Notes (Signed)
 PROGRESS NOTE   Subjective/Complaints: Some headache today, discussed that it may take several days for the full effect of the current dose of Topamax  to become apparent.  She does have a neurologist that treats her chronic migraine headaches.  ROS: Patient denies CP, SOB, N/V/D, no HA at present    Objective:   No results found.  No results for input(s): WBC, HGB, HCT, PLT in the last 72 hours.   No results for input(s): NA, K, CL, CO2, GLUCOSE, BUN, CREATININE, CALCIUM  in the last 72 hours.    Intake/Output Summary (Last 24 hours) at 11/11/2023 0834 Last data filed at 11/10/2023 1413 Gross per 24 hour  Intake 480 ml  Output --  Net 480 ml         Physical Exam: Vital Signs Blood pressure (!) 157/79, pulse 63, temperature 97.8 F (36.6 C), temperature source Oral, resp. rate 18, height 5' 1 (1.549 m), weight 78.1 kg, SpO2 96%.   General: No acute distress Mood and affect are appropriate Heart: Regular rate and rhythm no rubs murmurs or extra sounds Lungs: Clear to auscultation, breathing unlabored, no rales or wheezes Abdomen: Positive bowel sounds, soft nontender to palpation, nondistended Extremities: No clubbing, cyanosis, or edema  Skin: No evidence of breakdown, no evidence of rash, scalp is clear mild tenderness just left of midline near vertex Neurologic: Cranial nerves II through XII intact, motor strength is 5/5 in left and 4-/5 right deltoid, bicep, tricep, grip, 5/5 left and 4/5 right hip flexor, knee extensors, ankle dorsiflexor and plantar flexor Sensory exam normal sensation to light touch in bilateral upper and lower extremities Cerebellar exam normal finger to nose to finger Musculoskeletal: 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 Prior neuro assessment  is c/w 11/11/2023 exam.   Assessment/Plan: 1. 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 Stable for D/C today F/u PCP in 3-4 weeks F/u PM&R 2 weeks Follow-up neurology in 4 weeks Follow-up with rheumatology as per their office See D/C summary See D/C instructions  No driving until cleared by MD Care Tool:  Bathing    Body parts bathed by patient: Chest, Abdomen, Front perineal area, Right upper leg, Left upper leg, Left arm, Right arm, Right lower leg, Left lower leg, Face, Buttocks   Body parts bathed by helper: Right arm, Left arm, Chest, Abdomen, Right upper leg, Buttocks, Front perineal area, Left upper leg, Right lower leg, Left lower leg, Face Body parts n/a: Buttocks   Bathing assist Assist Level: Supervision/Verbal cueing     Upper Body Dressing/Undressing Upper body dressing   What is the patient wearing?: Pull over shirt    Upper body assist Assist Level: Independent    Lower Body Dressing/Undressing Lower body dressing      What is the patient wearing?: Pants, Underwear/pull up     Lower body assist Assist for lower body dressing: Independent with assitive device     Toileting Toileting    Toileting assist Assist for toileting: Independent with assistive device     Transfers Chair/bed transfer  Transfers assist     Chair/bed transfer assist level: Independent with assistive device     Locomotion Ambulation   Ambulation assist      Assist level: Supervision/Verbal cueing Assistive device: Rollator Max distance: 898   Walk 10 feet activity   Assist     Assist level: Independent with assistive device Assistive device: Rollator   Walk 50 feet activity   Assist    Assist level: Independent with assistive device Assistive device: Rollator    Walk 150 feet activity   Assist Walk 150 feet activity did not occur: Safety/medical concerns  Assist level: Independent with assistive device Assistive device:  Rollator    Walk 10 feet on uneven surface  activity   Assist     Assist level: Supervision/Verbal cueing Assistive device: Rollator   Wheelchair     Assist Is the patient using a wheelchair?: No Type of Wheelchair: Manual Wheelchair activity did not occur: Refused (pt did not use w/c for mobility during stay and will not use at home on d/c - refused mobility on d/c)  Wheelchair assist level: Dependent - Patient 0% Max wheelchair distance: 150'    Wheelchair 50 feet with 2 turns activity    Assist    Wheelchair 50 feet with 2 turns activity did not occur: Refused   Assist Level: Dependent - Patient 0%   Wheelchair 150 feet activity     Assist  Wheelchair 150 feet activity did not occur: Refused   Assist Level: Dependent - Patient 0%   Blood pressure (!) 157/79, pulse 63, temperature 97.8 F (36.6 C), temperature source Oral, resp. rate 18, height 5' 1 (1.549 m), weight 78.1 kg, SpO2 96%.  Medical Problem List and Plan: 1. 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  Antithrombotics: -DVT/anticoagulation:  Pharmaceutical: Other (comment) Pradaxa              -antiplatelet therapy: N/A 3. Pain Management: Oxycodone  5 mg every 4 hours as needed,  tramadol  for moderate pain              - Pt reports she was previously discharged from [facility] pain clinic after oxycodone  was not found in UDS Will d/c hydrocodone  and tramadol  prior to d/c , used tylenol  and NSAID PTA for pain , would rec tylenol  only  Currently using ~1 tramadol  per day and 2 hydrocodone  - will not have as much activity post d/c   - Increase gabapentin  to 300 mg 3 times daily  -7/29headaches improved on topiramate    -has used  imitrex  remotely. Contraindicated for CVA       -was on higher dose of gabapentin  (300mg  tid) previously. Might be beneficial for headaches and FMS 4. Mood/Behavior/Sleep: BuSpar  15 mg twice daily, Cymbalta  90 mg daily, melatonin 5 mg nightly as needed             -antipsychotic agents: N/A 5. Neuropsych/cognition: This patient is capable of making decisions on her own behalf. 6. Skin/Wound Care: Routine skin checks No scalp lesions to correlate with area of mild tenderness to palpation left of vertex, MRI showing  no skull or scalp abnormalities  7. Fluids/Electrolytes/Nutrition: Routine in and outs with follow-up chemistries 8.  Hyperlipidemia.  Lipitor /Zetia  9.  GERD.  Protonix  10. Atrial fibrillation.  Continue Pradaxa .  Cardiac rate controlled. But HRs creeping up to 90-100 resume metoprolol , flecainide on hold for now  11.  Prediabetes.  Hemoglobin A1c 6.0.  Currently on a regular diet.  CBGs discontinued 12. Obesity.  Dietary counseling             Body mass index is 31.95 kg/m. 13, Rheumatoid arthritis/Fibromyalgia              -Continue Cymbalta , pain control             -She says was on methotrexate in the past but this was stopped by her rheumatologist about 6 months ago, states she has been on Orencia              -She was on gabapentin  300mg  TID before admission, Restart at lower dose 100mg  TID for now, could consider increasing back up at a later time- see No 3, increased gabapentin  dose 14.  Hypertension.  Resume metoprolol .Improved HR 76-80bpm  Vitals:   11/10/23 1939 11/11/23 0503  BP: (!) 152/89 (!) 157/79  Pulse: 72 63  Resp: 18 18  Temp: 97.6 F (36.4 C) 97.8 F (36.6 C)  SpO2: 96% 96%   increase metoprolol  to 50mg , heart rate in a good range, blood pressure remains mildly elevated, will need PCP follow-up on this  15. Constipation, Continue MiraLAX  daily and sorbitol  as needed             - Patient got sorbitol  today, consider additional medication tomorrow if no  BM  - 7/24 LBM 16. Azotemia, push fluids. ---recheck Monday     Latest Ref Rng & Units 11/03/2023    9:12 AM 10/29/2023    4:40 AM 10/25/2023   10:17 PM  BMP  Glucose 70 - 99 mg/dL 857  887  88   BUN 8 - 23 mg/dL 20  27  18    Creatinine 0.44 - 1.00 mg/dL 9.19  8.91  8.99   Sodium 135 - 145 mmol/L 135  137  141   Potassium 3.5 - 5.1 mmol/L 4.2  4.1  4.0   Chloride 98 - 111 mmol/L 101  104  107   CO2 22 - 32 mmol/L 25  24    Calcium  8.9 - 10.3 mg/dL 9.1  8.8      17. Anemia mild, recheck stable     Latest Ref Rng & Units 11/03/2023    4:51 AM 10/29/2023    4:40 AM 10/25/2023   10:17 PM  CBC  WBC 4.0 - 10.5 K/uL 6.7  7.8    Hemoglobin 12.0 - 15.0 g/dL 88.3  88.3  87.7   Hematocrit 36.0 - 46.0 % 36.8  36.1  36.0   Platelets 150 - 400 K/uL 235  245             LOS: 14 days A FACE TO FACE EVALUATION WAS PERFORMED  Prentice FORBES Compton 11/11/2023, 8:34 AM

## 2023-11-11 NOTE — Plan of Care (Signed)
  Problem: RH Balance Goal: LTG Patient will maintain dynamic sitting balance (PT) Description: LTG:  Patient will maintain dynamic sitting balance with assistance during mobility activities (PT) Outcome: Completed/Met Flowsheets (Taken 11/11/2023 0654) LTG: Pt will maintain dynamic sitting balance during mobility activities with:: Independent Goal: LTG Patient will maintain dynamic standing balance (PT) Description: LTG:  Patient will maintain dynamic standing balance with assistance during mobility activities (PT) Outcome: Completed/Met Flowsheets (Taken 11/11/2023 0654) LTG: Pt will maintain dynamic standing balance during mobility activities with:: Independent with assistive device    Problem: Sit to Stand Goal: LTG:  Patient will perform sit to stand with assistance level (PT) Description: LTG:  Patient will perform sit to stand with assistance level (PT) Outcome: Completed/Met Flowsheets (Taken 11/11/2023 0654) LTG: PT will perform sit to stand in preparation for functional mobility with assistance level:  Independent with assistive device  Independent   Problem: RH Bed Mobility Goal: LTG Patient will perform bed mobility with assist (PT) Description: LTG: Patient will perform bed mobility with assistance, with/without cues (PT). Outcome: Completed/Met Flowsheets (Taken 11/11/2023 0654) LTG: Pt will perform bed mobility with assistance level of: Independent with assistive device    Problem: RH Bed to Chair Transfers Goal: LTG Patient will perform bed/chair transfers w/assist (PT) Description: LTG: Patient will perform bed to chair transfers with assistance (PT). Outcome: Completed/Met Flowsheets (Taken 11/11/2023 0654) LTG: Pt will perform Bed to Chair Transfers with assistance level: Independent with assistive device    Problem: RH Car Transfers Goal: LTG Patient will perform car transfers with assist (PT) Description: LTG: Patient will perform car transfers with assistance  (PT). Outcome: Completed/Met Flowsheets (Taken 10/29/2023 1559 by Dasie Elsie BROCKS, PT) LTG: Pt will perform car transfers with assist:: Supervision/Verbal cueing   Problem: RH Ambulation Goal: LTG Patient will ambulate in controlled environment (PT) Description: LTG: Patient will ambulate in a controlled environment, # of feet with assistance (PT). Outcome: Completed/Met Flowsheets Taken 11/11/2023 0654 by Thaddeus Mliss LABOR, PT LTG: Pt will ambulate in controlled environ  assist needed:: Independent with assistive device Taken 10/29/2023 1559 by Dasie Elsie BROCKS, PT LTG: Ambulation distance in controlled environment: 150' with LRAD Goal: LTG Patient will ambulate in home environment (PT) Description: LTG: Patient will ambulate in home environment, # of feet with assistance (PT). Outcome: Completed/Met Flowsheets (Taken 10/29/2023 1559 by Dasie Elsie BROCKS, PT) LTG: Pt will ambulate in home environ  assist needed:: Supervision/Verbal cueing LTG: Ambulation distance in home environment: 91' with LRAD   Problem: RH Stairs Goal: LTG Patient will ambulate up and down stairs w/assist (PT) Description: LTG: Patient will ambulate up and down # of stairs with assistance (PT) Outcome: Completed/Met Flowsheets (Taken 10/29/2023 1559 by Dasie Elsie BROCKS, PT) LTG: Pt will ambulate up/down stairs assist needed:: Supervision/Verbal cueing LTG: Pt will  ambulate up and down number of stairs: 1 step with rail per home setup

## 2023-11-12 ENCOUNTER — Other Ambulatory Visit (HOSPITAL_COMMUNITY): Payer: Self-pay

## 2023-11-24 NOTE — Unmapped External Note (Signed)
 Acute OT Evaluation   Precautions: Other Therapy Precautions: Fall risk, Aspiration   Medical Diagnosis/Course: OT Diagnosis/Course Pertinent Medical Course:  Patient was admitted on 8/10 for syncope and fall. MRI positive for acute CVA.  11/22/23 Brain MRI: 1. Acute small vessel infarcts in the left centrum semiovale at the junction of the frontal and parietal lobes. 2. No evidence of hemorrhage, mass, or hydrocephalus.  10/26/23 Brain MRI:  1. Acute nonhemorrhagic infarct involving the left precentral gyrus.  2. Small lacunar white matter infarcts involving the left temporal  occipital junction and the left parietal lobe.  3. Advanced cerebral white matter disease.  OT Treatment Diagnosis:    Impaired coordination  ASSESSMENT SUMMARY   A Low Complexity Occupational Therapy evaluation was completed on this date. Patient is a 68 y.o. female admitted to [facility] 11/22/2023 for above noted medical diagnosis/course. Full medical history is detailed below. Functional level prior to admission: Patient reports her daughter has been assisting with showering and dressing at home. Daughter provides IADLs and transportation. Patient has been using a rollator in the home but reports ambulating without an assistive device with therapy. Patient is independent with medication management. Patient was set up for outpatient PT and OT after recent discharge from [facility] but had not yet started..   Currently patient is independent with ADLs and functional mobility. She does have mild coordination deficit which reports is residual from CVA in July 2025. She reports having no new deficits this admission. Full details of patient's ADL and functional mobility status during the evaluation is described below. Patient presents with AM PAC score of 24 indicating 0% impairment in activities of daily living per AM [facility] 6-Click Daily Activity Conversion Table.    Education was provided for home exercise/neuromuscular  re-education which she is capable of implementing herself. No further OT needs.   Problem List:  Rehab Potential Rehab Potential:  (Not applicable) Complexity/Co-morbidities that Impact POC: Arthritis, Neurologic Diagnosis Impairments/Limitations: Coordination Deficits, Muscle weakness, Fine Motor deficits   OT Recommendation: OT Recommendation: Home with caregiver, No skilled OT needs at discharge OT Equipment Recommended: Shower Chair  HOME/PLOF     Home Living Environment: Type of Home House  Home Layout Multi-level, Bedroom on main level, Performs ADLs on one level  Exterior Stairs - rails None  Exterior Stairs - number 2  Interior Stairs - rails Right going up  [employer] - number flight to basement but doesn't need to access that level  [employer] (also has a tub shower)  Automotive engineer bars in shower, Hand-held shower, Academic librarian Accessibility Patient reports her rollator does not fit in the Mining engineer  Equipment Currently Using Pt uses her rollator in her home.  Additional Comments Patient reports her daughter has been assisting with showering and dressing at home. Daughter provides IADLs and transportation. Patient has been using a rollator in the home but reports ambulating without an assistive device with therapy. Patient is independent with medication management. Patient was set up for outpatient PT and OT after recent discharge from [facility] but had not yet started.   Prior Level of Function:  Level of Independence Requiring assistance  Lives With Daughter (3 grandchildren)  Person(s) able to assist at d/c daughter (in process of obtaining disability due to autoimmune disease), grandchildren (19yo, 55yo, 2yo). Daughter Deirdre) works from home. Reports she will have 24/7 care at [place]  Patient Responsibilities  Personal ADLs, Manage Medications  Requires Assist With  Home  Management (Transportation)    Fall History: Fall in the last year?: Yes Feel unsteady or off balance?: Yes  PERTINENT MEDICAL INFORMATION   Past Medical History: Medical History[1]  Past Surgical History: Surgical History[2]  SUBJECTIVE  Consent for therapy provided by: Nurse Lindia)  Assessed in the Presence of: Alone  Team Communication: Communication With: Patient, Nursing, and Case Manager Communication Details: RN notified of patient's current position, performance during session, and discharge recommendations and Case manager notified of therapy recommendations  Subjective: If it weren't for the MRI I wouldn't know I had another stroke.  OBJECTIVE   Appearance: Lines/Leads: saline lock  Pain: Pain Assessment: No/denies pain (Pt has chronic pain from RA however it is not bothering her at this time.)  Cognition:  Cognition Orientation Level: Oriented x4 Overall Cognitive Status: Within Functional Limits Executive Functioning: Intact   Vision/Perception and Hearing  Current Vision: Wears glasses all the time Did patient use glasses during this session?: No Vision Detail: Pt reports no new vision deficits. Tracking and periphery testing intact. Left/right discrimination intact. Perception: Within Functional Limits Hearing: Within Functional Limits   Communication: Within Normal Limits  ROM:  Left Upper Extremity:  Decreased shoulder ROM to 70 degrees; distal AROM WNL except arthritis causing decreased digit flexion. (Bouchard and Haberden nodes present)  Right Upper Extremity:  Decreased shoulder ROM to 70 degrees; distal AROM WNL except arthritis causing decreased digit flexion. (Bouchard and Haberden nodes present)  Strength:   Left Right  Shoulder flexion 2+/5 2+/5  Biceps flexion 4/5 4/5  Triceps extension 4/5 4/5  Gross grip 3-/5 3-/5    Skin Integrity and Edema:  Skin: Intact in visualized areas Edema: No edema   Sensation:  Sensation Numbness: Chronic  intermittent sensation deficit in her feet related to OA. Reports no new deficits.  Coordination:  Hand Dominance: Right  Coordination Dysmetria : No UE deficit Dysdiadochokinesia : No UE deficits Fine Motor Function Impaired: Right opposition slow compared to left. Reports this has been present since CVA in July.   ADLs/Functional Mobility: UB Dressing: Pt independently donned UB garment seated EOB.   LB Dressing: Pt independently donned/doffed brief.   Toileting: Pt independently completed toileting including BM hygiene just prior to this session.   Bathing: Not assessed  Grooming: Pt independently brushed her hair with her right UE. Mild challenge due to coordination deficit.   Feeding: Pt is independent with use of compensatory strategies.   Functional Mobility:   Bed Mobility: Supine to sit and sit to supine completed independently.   Transfers: Pt stood from bed and later sat on bed with modified independence using arms for support.   Functional Mobility: Pt ambulated 100 ft in the room and hall using no AD independently. She needed CGA for tandem gait and close supervision when turning around in a circle. Of note, she has been using a rollator at home.   Balance:  Sitting - Static: Independent Sitting - Dynamic: Independent Standing - Static: Independent Standing - Dynamic: Contact Guard Assistance, Close supervision Balance Additional Comments: Using no AD for support  Condition After Therapy: Condition After Therapy: Back to bed, Nursing notified of condition, All needs within reach, Lines intact (RN has discontinued use of alarm system)  INTERVENTIONS   Skilled OT Treatment Provided  -Evaluation completed. -Education provided for home exercise/NMR to improve functional coordination of the dominant right hand. Recommended typing, playing the piano, and handwriting. Recommended fine motor tasks such as finger to hand and hand to finger manipulation.  Recommended  continuation of normal activities taking increased time as needed to improve safety.  -Education provided for option to use paraffin bath to manage arthritic hand pain. Pt reports she has used in the past but forgot about it. Edu for how to obtain home unit. Suggested she contact her insurance to see if they will assist financially.    Response to Interventions:  Patient in no acute distress Fully participatory with evaluation  Education:  Education provided to: Patient  Education provided regarding:  Role and POC of OT See educational details above Discharge recommendations  Response to Education: Patient verbalizes understanding  PERFORMANCE OUTCOME MEASURES  AM-PAC - Daily Activity:    Flowsheet Row Most Recent Value  AM-PAC 6-Clicks - Daily Activity  Lower Body Clothing Activity None  Bathing None  Toileting None  Upper Body Clothing Activity None  Personal Grooming None  Eating Meals None  AM-PAC Total Score 24   AM-PAC self care score was determined based on a combination of clinical assessment, functional performance, cognition, balance, ROM, strength, and coordination.   Modified Rankin Scale:  Assessment Source: Patient Could you live alone without any help from another person? This means being able to bathe, use the toilet, shop, prepare or get meals, and manage finances.: Yes Are you able to do everything that you were doing right before your stroke, even if slower and not as much?: Yes (except drive) Are you completely back to the way you were right before your stroke?: No Are you able to walk without help from another person?: Yes Do you normally have someone available at all times to help with meals, using the toilet or walking?: No  mRS Total: 1/5  OT PLAN OF CARE   Rehab Potential: Rehab Potential Rehab Potential:  (Not applicable) Complexity/Co-morbidities that Impact POC: Arthritis, Neurologic Diagnosis Impairments/Limitations: Coordination  Deficits, Muscle weakness, Fine Motor deficits  Plan: Plan Patient and Family Goals: To return home with her family and be permitted to do more around the house Treatment Plan/Goals Established with Patient/Caregiver: Yes Planned Treatment/Interventions: Neuromuscular reeducation OT Frequency: One time visit OT Duration: Discharge  OT Goals: Encounter Problems     Encounter Problems (Active)     Occupational Therapy     Patient will independently complete neuromuscular re-education/coordination activities in order to increase functional fluidity/efficiency of the dominant RUE.  (Adequate for Discharge)     Start:  11/24/23    Expected End:  11/24/23             TREATMENT TIME    Time In: 1203 Time Out: 1226 Time in Timed codes: 8 Total Treatment Time: 23 OT Eval Low Complexity minutes: 15 Treatment/Procedures Time Entry Neuromuscular Re-education minutes: 8    Charges           11/24/2023   Code Description Service Provider Modifiers Quantity  [id number] Hc Ot Neuromusc Reeducation 1+ Areas Ea [address], OT/L GO 1  [id number] Hc Ot Occupational Therapy Eval Low Complex 30 Mins [place] [employer], OT/L GO 1        Time of Service Note Type Status  None           Occupation profile and medical/therapy history is inclusive of:  Subjective intake via patient interview Review of hospital records for current admission inclusive of medical/surgical history, lab values, vitals, imaging, orders, and multidisciplinary notes Review of hospital records for prior admission(s) Review of previous therapy episodes of care  Areas of Performance Deficit: IADLs  and driving       [8] Past Medical History: Diagnosis Date  . A-fib    (CMD)   . Allergy   . Anemia   . Anxiety   . Depression   . Fibromyalgia   . GERD (gastroesophageal reflux disease)   . Glaucoma   . Hypercholesterolemia   . Hyperkeratosis of vulva   . Hypertension   . IBS (irritable bowel  syndrome)   . Muscle spasm   . OA (osteoarthritis)   . OSA (obstructive sleep apnea)   . Pulmonary embolism    (CMD)   . Rash   . Rheumatoid arthritis    (CMD)   . RLS (restless legs syndrome)   . Stress incontinence   . Urinary incontinence   [2] Past Surgical History: Procedure Laterality Date  . BACK SURGERY  09/2019   Procedure: BACK SURGERY  . CATARACT EXTRACTION W/  INTRAOCULAR LENS IMPLANT Right 10/04/2020   Procedure: CATARACT EXTRACTION W/  INTRAOCULAR LENS IMPLANT; Dr Huey [id number], SN # [id number], +19.5 D  . CATARACT EXTRACTION W/  INTRAOCULAR LENS IMPLANT Right 10/04/2020   Procedure: CATARACT EXTRACTION RIGHT EYE /W IMPLANT;  Surgeon: Comer Jerilee Huey, MD;  Location: [facility] OUTPATIENT OR;  Service: Ophthalmology;  Laterality: Right;  30TIP; PF LIDOCAINE ; HONAN  . CATARACT EXTRACTION W/  INTRAOCULAR LENS IMPLANT Left 10/23/2020   Procedure: CATARACT EXTRACTION W/  INTRAOCULAR LENS IMPLANT; Dr Huey SN60WF, SN # [id number], +20.5 D  . CATARACT EXTRACTION W/  INTRAOCULAR LENS IMPLANT Left 10/23/2020   Procedure: CATARACT EXTRACTION LEFT EYE /W IMPLANT;  Surgeon: Comer Jerilee Huey, MD;  Location: [facility] OUTPATIENT OR;  Service: Ophthalmology;  Laterality: Left;  30TIP; PF LIDOCAINE ; HONAN  . COLONOSCOPY  2019   Procedure: COLONOSCOPY  . DILATION AND CURETTAGE OF UTERUS  1983   Procedure: DILATION AND CURETTAGE OF UTERUS; d/t miscarriage  . INNER EAR SURGERY  age 41   Procedure: INNER EAR SURGERY  . TONSILLECTOMY  age 28   Procedure: TONSILLECTOMY  . YAG CAPSULOTOMY Right 12/04/2022   Dr Huey

## 2023-11-24 NOTE — Unmapped External Note (Signed)
 Acute Physical Therapy Evaluation   Precautions: Other Therapy Precautions: Fall risk, Aspiration   Medical Diagnosis/Course:  PT Diagnosis/Course Pertinent Medical Course: Pt admitted to [facility] on 08/21/23 for c/o syncopal episode, denies hitting head. Found to have hypotension, UTI and acute CVA. Has hx of syncopal episodes. MRI brain + Acute small vessel infarcts in the left centrum semiovale at the junction of the frontal and parietal lobes.   Imaging: CT Chest Abdomen Pelvis Impression:  1. Moderate stool burden.  No fecal impaction.  2. Aortic atherosclerosis (ICD10-I70.0). Coronary artery  calcification.   MRI Brain  Impression:  1. Acute small vessel infarcts in the left centrum semiovale at the junction of  the frontal and parietal lobes.  2. No evidence of hemorrhage, mass, or hydrocephalus.    XR Chest  Impression:  No active disease.   CT Spine Cervical  Impression:  1. No acute abnormality of the cervical spine related to the reported syncope  and fall.  2. Mild straightening of the normal cervical lordosis.  3. Ankylosis across the disc space and right-sided facets at C4-5.  4. Asymmetric right-sided facet hypertrophy at C3-4 contributing to moderate  right foraminal stenosis.  5. Moderate foraminal stenosis bilaterally at C4-5, C5-6, and C6-7.   CT Head  Impression:  1. No acute intracranial abnormality related to the syncope and fall.  2. Moderately advanced periventricular white matter disease, stable.  3. Multiple remote lacunar infarcts and dilated perivascular spaces within the  basal ganglia bilaterally.  ASSESSMENT SUMMARY   Patient is a 68 y.o. female admitted to [facility] 11/22/2023 for above noted medical diagnosis/course. Significant PMH includes: Afib, anxiety, depression, RLS, HTN, PE, incontinence, CVA in July 2025. Patient was recently admitted with [facility] due to acute CVA in July 2025 and went to their inpatient rehab for 2 weeks. Patient had been  home from rehab for 3 days prior to this admission. Functional level prior to admission: Patient reports her daughter has been assisting with showering and dressing at home. Daughter provides IADLs and transportation. Patient has been using a rollator in the home but reports ambulating without an assistive device with therapy. Patient is independent with medication management. Patient was set up for outpatient PT and OT after recent discharge from IPR but had not yet started.  A Low Complexity Physical Therapy evaluation was completed this date. The patient presents with a decline from functional baseline with deficits noted in problem list below. Please see functional mobility section below for information pertaining to current assistance levels provided during this session in comparison to above noted functional level PTA. Pt deficits present as [employer] AM-PACT "6 Clicks" Basic Mobility score of 22 indicating 25.80% of functional impairment. Pt will benefit from skilled acute PT services to address deficits for improved safety with functional mobility. PT recommends OPPT at [place] for continued functional mobility training to improve independence with all levels of mobility and return toward PLOF.    Problem list:  Rehab Potential Rehab Potential: Excellent   PT Recommendation: PT Recommendation: Outpatient PT PT Equipment Recommended: None  Home Living Environment: Type of Home House  Home Layout Multi-level, Bedroom on main level, Performs ADLs on one level  Exterior Stairs - number 2  Exterior Stairs - rails None  Interior Stairs - number flight to basement but doesn't need to access that level  [employer] - rails Right going up  [employer] (also has a Youth worker)  Automotive engineer bars in  shower, Hand-held shower, Shower chair  Bathroom Accessibility Patient reports her rollator does not fit in the bathroom  Home Equipment Walker-rollator      Additional Comments Patient reports her daughter has been assisting with showering and dressing at home. Daughter provides IADLs and transportation. Patient has been using a rollator in the home but reports ambulating without an assistive device with therapy. Patient is independent with medication management. Patient was set up for outpatient PT and OT after recent discharge from IPR but had not yet started.   Prior Level of Function:    Lives With Daughter (3 grandchildren)  Person(s) able to assist at d/c daughter (in process of obtaining disability due to autoimmune disease), grandchildren (19yo, 58yo, 2yo). Daughter Deirdre) works from home. Reports she will have 24/7 care at [place]         Fall History: Subjective Fall History Fall in the last year?: Yes Feel unsteady or off balance?: Yes  PERTINENT MEDICAL INFORMATION     Past Medical History: Medical History[1]  Past Surgical History: Surgical History[2]  Lines and Leads: saline lock and telemetry    SUBJECTIVE   Communication:  Communication: Patient, Nursing, OT, Clinical Case Management    Subjective: My daughter has been reminding me to use my rollator.   Patient Goal: to discharge home and start outpatient PT  Assessed in the Presence of: Alone   Consent for therapy provided by: Nurse  OBJECTIVE   Pain: Pain Assessment: 0-10 Pain Score  : 8 Pain Type: Chronic pain Pain Location:  (hands, knees, and headache) Pain Intervention(s): Emotional support, Ambulation/Increased activity  Cognition: Orientation Level: Oriented x4 Safety Awareness: Intact  Range of Motion: Overall ROM Assessment: Bilateral LEs WFL  Strength: Overall Strength Assessment: Bilateral LEs WFL  Coordination: Coordination Additional Comments: bilateral LE heel to shin intact and Sensation:  denies numbness or tingling   Skin Integrity and Edema:  Skin Integrity & Edema Skin: Intact in visualized areas Edema: No  edema  Balance: Sitting - Static: Independent Sitting - Dynamic: Independent Standing - Static: Independent Standing - Dynamic: Contact Guard Assistance, Close supervision Balance Additional Comments: Using no AD for support  Functional Mobility:  Bed Mobility Supine to Sit: Independent Head of bed elevated   Transfers Sit to Stand: Independent Stand to Sit: Independent Toilet Transfers - Level of Assistance: Independent Patient was able to stand at bed and toilet with independence, once with rolling walker from bed and once without an assistive device. Stand to sit at toilet, bed, and chair with independence.   Mobility Gait Distance (ft): 250 ft Gait Assistance: Close supervision, Contact Guard Assistance PT engaged patient in gait trial for 250 feet without assistive device with close supervision for safety with the exception of 2 losses of balance with contact guard assist to steady. Patient ambulates with a slow cadence. Patient scored 10/12 on the 4 item DGI with one loss of balance during vertical head turns.    Condition After Therapy:  Up in chair, Nursing notified of condition, All needs within reach, Alarm activated, Lines intact, Fall interventions in place   INTERVENTIONS   Skill Performed by Clinician: Education on proper use of assistive device/adaptive equipment Monitoring exercise/activity tolerance Verbal cues for proper technique  Education:  Education provided to: Patient Education provided regarding:  Role and POC of PT, Importance of increasing activity, Call and wait for assistance, and Discharge recs Response to Education: Patient verbalizes understanding  PERFORMANCE OUTCOME MEASURES   AM-PAC - Basic Mobility:  Flowsheet Row Most Recent Value  AM-PAC 6-Clicks - Basic Mobility  Turning from you back to your side while in a flat bed without using bed rails? None  Moving from lying on your back to sitting on the side of a flat bed without  using bed rails? None  Moving to and from a bed to a chair (including a wheelchair)? None  Standing up from a chair using your arms (e.g, wheelchair, or bedside chair)? None  To walk in a hospital room? A little  Climbing 3-5 steps with a railing? A little  AM-PAC Total Score 22    PT PLAN OF CARE   Rehab Potential:  Rehab Potential: Excellent  Plan:  Plan Treatment Plan/Goals Established with Patient/Caregiver: Yes PT Frequency: Discharge   TREATMENT TIME   Time In: 1018 Time Out: 1046 Time in Timed codes:   Total Treatment Time: 28 PT Eval Low Complexity minutes: 28       Charges           11/24/2023   Code Description Service Provider Modifiers Quantity  [id number] Hc Pt Physical Therapy Evaluation Low Complex 20 Mins [place] LITTIE Hurst, PT GP 1        Time of Service Note Type Status  None           PT Evaluation Complexity History of Comorbidities/Personal Care Impacting Plan of Care: 1-2 personal factors and/or comorbidities Examination of Body Systems: 3 or more elements Presentation of Characteristics: Stable and/or uncomplicated characteristics PT Eval Complexity Score: 170 PT Eval Complexity Score: Low Complexity                 [1] Past Medical History: Diagnosis Date  . A-fib    (CMD)   . Allergy   . Anemia   . Anxiety   . Depression   . Fibromyalgia   . GERD (gastroesophageal reflux disease)   . Glaucoma   . Hypercholesterolemia   . Hyperkeratosis of vulva   . Hypertension   . IBS (irritable bowel syndrome)   . Muscle spasm   . OA (osteoarthritis)   . OSA (obstructive sleep apnea)   . Pulmonary embolism    (CMD)   . Rash   . Rheumatoid arthritis    (CMD)   . RLS (restless legs syndrome)   . Stress incontinence   . Urinary incontinence   [2] Past Surgical History: Procedure Laterality Date  . BACK SURGERY  09/2019   Procedure: BACK SURGERY  . CATARACT EXTRACTION W/  INTRAOCULAR LENS IMPLANT Right 10/04/2020   Procedure:  CATARACT EXTRACTION W/  INTRAOCULAR LENS IMPLANT; Dr Huey [id number], SN # [id number], +19.5 D  . CATARACT EXTRACTION W/  INTRAOCULAR LENS IMPLANT Right 10/04/2020   Procedure: CATARACT EXTRACTION RIGHT EYE /W IMPLANT;  Surgeon: Comer Jerilee Huey, MD;  Location: [facility] OR;  Service: Ophthalmology;  Laterality: Right;  30TIP; PF LIDOCAINE ; HONAN  . CATARACT EXTRACTION W/  INTRAOCULAR LENS IMPLANT Left 10/23/2020   Procedure: CATARACT EXTRACTION W/  INTRAOCULAR LENS IMPLANT; Dr Huey SN60WF, SN # [id number], +20.5 D  . CATARACT EXTRACTION W/  INTRAOCULAR LENS IMPLANT Left 10/23/2020   Procedure: CATARACT EXTRACTION LEFT EYE /W IMPLANT;  Surgeon: Comer Jerilee Huey, MD;  Location: [facility];  Service: Ophthalmology;  Laterality: Left;  30TIP; PF LIDOCAINE ; HONAN  . COLONOSCOPY  2019   Procedure: COLONOSCOPY  . DILATION AND CURETTAGE OF UTERUS  1983   Procedure: DILATION AND CURETTAGE OF UTERUS; d/t miscarriage  .  INNER EAR SURGERY  age 53   Procedure: INNER EAR SURGERY  . TONSILLECTOMY  age 73   Procedure: TONSILLECTOMY  . YAG CAPSULOTOMY Right 12/04/2022   Dr Huey

## 2023-11-24 NOTE — Discharge Summary (Addendum)
 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   She was earlier admitted to CDU on 8/10 for syncope and fall.MRI of the brain revealed acute small vessel infarcts of the left centrum semiovale at the junction of the frontal and parietal lobes.  Neurology saw her, recommended to continue Pradaxa .  Aspirin is discontinued.  PT and OT saw her, recommended for outpatient PT.   Heme-onc consulted to help with anticoagulation workup.  CT of the chest/abdomen/pelvis negative for any masses.  Echo revealed EF 60 to 65%, mild pulmonary hypertension.  Carotid Dopplers negative for significant stenosis.  Post Discharge Follow Up Issues:  Follow-up with PCP in 1 week Follow-up with outpatient neurology in 2 to 4 weeks    Procedures: None _____________________________________________________________________________ Discharge Day Services: BP 148/74 (BP Location: Right arm, Patient Position: Sitting)   Pulse 64   Temp 97.5 F (36.4 C) (Oral)   Resp 18   Ht 1.549 m (5' 1)   Wt 78 kg (171 lb 15.3 oz)   SpO2 99%   BMI 32.49 kg/m  Pt seen on the day of discharge and determined appropriate for discharge. GEN: NAD, lying in bed EYES: EOMI ENT: MMM CV: RRR PULM: CTA B ABD: soft, NT/ND, +BS EXT: No edema    Condition at Discharge:  fair  Length of Discharge: I spent 45  mins in the discharge of this patient. _____________________________________________________________________________ Discharge Medications: Patient Instructions:    Discharge Medications     Medications To Continue      Sig Disp Refill Start End  acetaminophen  500 mg tablet Commonly known as: TYLENOL   Take 1,000 mg by mouth every 4 (four) hours as needed (pain).   0     albuterol  HFA 90 mcg/actuation inhaler Commonly known as: PROVENTIL  HFA;VENTOLIN  HFA;PROAIR  HFA  Inhale 2 puffs every 6 (six) hours as needed for wheezing.  1 each  5     ascorbic acid  500 mg tablet Commonly known as: Vitamin C   Take 1 tablet (500 mg total) by mouth 2 (two) times a day.  200 tablet  3     atorvastatin  80 mg tablet Commonly known as: LIPITOR   Take 1 tablet (80 mg total) by mouth daily.  90 tablet  3     busPIRone  15 mg tablet Commonly known as: BUSPAR   Take 1 tablet (15 mg total) by mouth 2 (two) times a day.  180 tablet  0     cetirizine 5 mg tablet Commonly known as: ZyrTEC  Take 5 mg by mouth at bedtime.   0     dabigatran  etexilate 150 mg capsule Commonly known as: PRADAXA   Take 150 mg by mouth 2 (two) times a day.   0     diclofenac sodium 1 % gel Commonly known as: VOLTAREN  Apply 2 g topically daily as needed.   0     DULoxetine  30 mg capsule Commonly known as:  CYMBALTA   Take 3 capsules (90 mg total) by mouth daily.  270 capsule  0     ezetimibe  10 mg tablet Commonly known as: ZETIA   Take 1 tablet (10 mg total) by mouth daily. **NO MORE REFILLS-NEEDS AN OFFICE VISIT**  30 tablet  0     flecainide 50 mg tablet Commonly known as: TAMBOCOR  Take 1 tablet (50 mg total) by mouth every 12 (twelve) hours.  180 tablet  3     furosemide  20 mg tablet Commonly known as: LASIX   TAKE 1 TABLET BY MOUTH AS NEEDED FOR SEVERE LEG SWELLING 1-2 TIMES PER WEEK  30 tablet  3     gabapentin  300 mg capsule Commonly known as:  NEURONTIN   Take 1 capsule (300 mg total) by mouth 3 (three) times a day.  90 capsule  1     Lumigan 0.01 % ophthalmic drops Generic drug: bimatoprost  Administer 1 drop into each eye at bedtime. (Administer 1 drop into each eyes at bedtime.)  2.5 mL  6     magnesium oxide 400 mg Tab tablet  Take 400 mg by mouth at bedtime.   0     methenamine  0.5 gram tablet  Take 500 mg by mouth 2 (two) times a day.   0     metoprolol  succinate 50 mg 24 hr tablet Commonly known as: TOPROL  XL  Take 50 mg by mouth daily.   0     multivitamin Cap  Take 1 capsule by mouth daily.   0     Narcan 4 mg/actuation Spry nasal spray Generic drug: naloxone  Administer 1 each into affected nostril(s) once as needed for up to 1 dose.  1 each  0     ondansetron  4 mg tablet Commonly known as: ZOFRAN   Take 1 tablet by mouth in the morning, 1 tablet in the afternoon and 1 tablet in the evening a needed for nausea/vomiting  30 tablet  0     pantoprazole  40 mg EC tablet Commonly known as: PROTONIX   Take 1 tablet (40 mg total) by mouth daily.  30 tablet  3     Repatha SureClick 140 mg/mL Pnij Generic drug: evolocumab  Inject the contents of 1 pen into the skin once every 2 weeks. *Rotate sites in the abdomen, thigh or outer upper arm. (Inject contents of 1 syringe into the skin once every 2 weeks rotating the abdomen, thigh or outer upper arm)  3 each  4     * tiZANidine  2 mg tablet Commonly known as: ZANAFLEX   Take 2 mg by mouth every morning.   0     * tiZANidine  2 mg tablet Commonly known as: ZANAFLEX   Take 1-2 tablets by mouth 2 (two) times a day as needed for muscle spasms. (Take 1-2 tablets (2-4 mg total) by mouth 2 (two) times a day as needed for muscle spasms.)  120 tablet  2     topiramate  25 mg tablet Commonly known as: TOPAMAX   Take 25 mg by mouth 2 (two) times a day.   0     triamcinolone  acetonide 0.1 % cream Commonly known as: KENALOG   Apply 1 Application topically as  needed.   0        * * There are duplicate medications prescribed to the patient          Stopped Medications    aspirin 81 mg EC tablet   montelukast 10 mg tablet Commonly known as: SINGULAIR  Orencia ClickJect 125 mg/mL Atin Generic drug: abatacept       Unreviewed Medications      Sig Disp Refill Start End  nystatin -triamcinolone  100,000-0.1 unit/gram-% ointment Commonly known as: MYCOLOG II  Apply 1 Application topically two times daily as needed.  60 g  2          _____________________________________________________________________________ Pending Test Results (if blank, then none): Pending Labs     Order Current Status   Anticardiolipin Antibodies (ACA), IgG, Quantitative In process   Anticardiolipin Antibodies (ACA), IgM, Quantitative In process   Beta-2-Glycoprotein 1 Antibodies, IgA, IgG, and IgM In process   Factor II (Prothrombin) DNA Mutation Analysis (Thrombophilia Testing) In process   Factor V Leiden DNA Mutation Analysis (Thrombophilia Testing) In process   Lupus Anticoagulant Screen With Reflex In process        Most Recent Labs:  Lab Results  Component Value Date/Time   WBC 7.23 11/24/2023 0251   RBC 3.82 (L) 11/24/2023 0251   HGB 11.6 (L) 11/24/2023 0251   HCT 34.5 (L) 11/24/2023 0251   PLT 213 11/24/2023 0251    Lab Results  Component Value Date   WBC 7.23 11/24/2023   HGB 11.6 (L) 11/24/2023   HCT 34.5 (L) 11/24/2023   PLT 213 11/24/2023    Lab Results  Component Value Date   CO2 25 11/24/2023   BUN 21 11/24/2023   GLUCOSE 92 11/24/2023   CREATININE 0.81 11/24/2023   CALCIUM  8.8 11/24/2023   ALBUMIN 3.7 11/22/2023   AST 9 (L) 11/22/2023   ALT 16 11/22/2023    Lab Results  Component Value Date   NA 139 11/24/2023   K 3.9 11/24/2023   CL 109 (H) 11/24/2023   CO2 25 11/24/2023   BUN 21 11/24/2023   CREATININE 0.81 11/24/2023   CALCIUM  8.8 11/24/2023   MG 1.8 (L) 08/23/2023    Lab Results  Component  Value Date   BILITOT 0.8 11/22/2023   BILIDIR 0.2 06/30/2022   PROT 6.1 (L) 11/22/2023   ALBUMIN 3.7 11/22/2023   ALT 16 11/22/2023   AST 9 (L) 11/22/2023   GGT 14 11/06/2020    Lab Results  Component Value Date   INR 1.5 11/22/2023   APTT 29.8 09/22/2021   Hospital Radiology:  Transthoracic echo (TTE) complete Result Date: 11/24/2023                                                   [facility]                                                 [facility]                                                 [place]  [facility]                                                   Center                                                   Cardiac                                                 Ultrasound-                                                 [place],                                                [facility]                                                    Bldg                                                  [address]                                                 [place]                                                  [place] [address]                                  Transthoracic Echocardiogram Report Name  ONETA, SIGMAN                          Study Date  11-23-2023                 Height  61 in MRN  [id number]  Patient Location  [facility]           Weight  166 lb DOB  Mar 15, 1956                               Gender  Female                         BSA  1.7 m2 Age  72 yrs                                   Ethnicity  1                           BP  136-74 mmHg Reason For Study  Stroke, follow up                                                  HR  66 Ordering Physician  NICHOLES, LADONNA            Performed By  PRIMUS PED Referring Physician  JOSEY, LADONNA - - PROCEDURE A two-dimensional transthoracic echocardiogram with color flow and Doppler was performed.  Image Quality  Technically adequate. - SUMMARY The left ventricular size is normal. There is normal left ventricular wall thickness. Left ventricular systolic function is normal. LV ejection fraction = 60-65%. No segmental wall motion abnormalities seen in the left ventricle The right ventricle is mildly dilated. The right ventricular systolic function is normal. The left atrium is mildly dilated. The right atrium is mildly dilated. Injection of agitated saline showed no right-to-left shunt. There is mild mitral regurgitation. There is mild tricuspid regurgitation. Mild pulmonary hypertension. The aortic sinus is normal size. IVC size was normal. There is no pericardial effusion. There is no significant change in comparison with the last study. - FINDINGS LEFT VENTRICLE The left ventricular size is normal. There is normal left ventricular wall thickness. Left ventricular systolic function is normal. LV ejection fraction = 60-65%. Left ventricular filling pattern is indeterminate. No segmental wall motion abnormalities seen in the left ventricle. - RIGHT VENTRICLE The right ventricle is mildly dilated. The right ventricular systolic function is normal. LEFT ATRIUM The left atrium is mildly dilated. RIGHT ATRIUM The right atrium is mildly dilated. Injection of agitated saline showed no right-to-left shunt. - AORTIC VALVE Structurally normal aortic valve. There is no aortic stenosis. There is no aortic regurgitation. - MITRAL VALVE The mitral valve leaflets appear normal. There is mild mitral regurgitation. - TRICUSPID VALVE Structurally normal tricuspid valve. There is mild tricuspid regurgitation. Estimated right ventricular systolic pressure is 39 mmHg. Mild pulmonary hypertension. - PULMONIC VALVE Structurally normal pulmonic valve. Trace pulmonic valvular regurgitation. There is no pulmonic valvular stenosis. - ARTERIES The aortic sinus is normal size. - VENOUS IVC size was normal. - EFFUSION There is no  pericardial effusion. - - MMode-2D Measurements & Calculations IVSd  1.00 cm              LA dim  4.5 cm             ESV MOD-sp4        LVOT diam  1.8 cm LVIDd  4.3  cm              EDV MOD-sp4   76.0 ml      27.3 ml LVPWd  0.94 cm                                        EDV MOD-sp2  LVIDs  2.9 cm                                         65.5 ml                                                       ESV MOD-sp2                                                        23.4 ml           _________________________________________________________________________________ SV MOD-sp4   48.7 ml       EF A4C  64.1 %             LA ESV  BP         LA ESV Index  A2C  SI MOD-sp4   27.9 ml-m2                               55.0 ml            23.9 ml-m2           _________________________________________________________________________________ LA ESV Index  A4C          LA ESV Index  BP           SV A4C  48.7 ml 37.6 ml-m2                 31.6 ml-m2 Doppler Measurements & Calculations MV E max vel             MV dec time        SV LVOT   48.8 ml               LV V1 VTI  18.6 cm 68.7 cm-sec              0.16 sec           Ao V2 max  104.0 cm-sec MV A max vel                                Ao max PG  4.3 mmHg 65.0 cm-sec                                 Ao V2 mean  68.8 cm-sec MV E-A  1.1  Ao mean PG  2.0 mmHg Med Peak E  Vel                             Ao V2 VTI  23.7 cm 9.0 cm-sec Lat Peak E  Vel                             AVA  VTI   2.1 cm2 8.7 cm-sec E-Lat E`  7.9 E-Med E`  7.6           _________________________________________________________________________________ TR max vel  298.0 cm-sec RAP systole        AS Dimensionless Index  VTI     AVAi VTI  cm^2-m^2 TR max PG  35.5 mmHg     3.0 mmHg           0.78 RVSP TR   38.5 mmHg                                                         1.2 cm2           _________________________________________________________________________________ SV index LVOT  28.0  ml-m2 ______________________________________________________________________________ Reading Physician                   MD Carlin Hoe, MD, [phone] 11-24-2023 08 18 AM  CT Chest Abdomen Pelvis W Contrast Result Date: 11/23/2023 CLINICAL DATA:  Fecal incontinence. EXAM: CT CHEST, ABDOMEN, AND PELVIS WITH CONTRAST TECHNIQUE: Multidetector CT imaging of the chest, abdomen and pelvis was performed following the standard protocol during bolus administration of intravenous contrast. RADIATION DOSE REDUCTION: This exam was performed according to the departmental dose-optimization program which includes automated exposure control, adjustment of the mA and/or kV according to patient size and/or use of iterative reconstruction technique. CONTRAST:  100 mL iohexoL  (OMNIPAQUE ) 350 mg iodine/mL injection (MDV) 100 mL COMPARISON:  CT abdomen pelvis 09/30/2022 and CT chest 09/28/2020. FINDINGS: CT CHEST FINDINGS Cardiovascular: Atherosclerotic calcification of the aorta, aortic valve and coronary arteries. Heart size normal. No pericardial effusion. Mediastinum/Nodes: No pathologically enlarged mediastinal, hilar or axillary lymph nodes. Esophagus is grossly unremarkable. Lungs/Pleura: Tiny peripheral lower lobe nodules are unchanged. Per Fleischner Society guidelines, no follow-up is necessary. Mild paraspinal scarring in the lower lobes. Lungs are otherwise clear. No pleural fluid. Airway is unremarkable. Musculoskeletal: Degenerative changes in the spine. Partial fusion of the right first and second ribs. CT ABDOMEN PELVIS FINDINGS Hepatobiliary: Liver and gallbladder are unremarkable. No biliary ductal dilatation. Pancreas: Negative. Spleen: Negative. Adrenals/Urinary Tract: Adrenal glands are unremarkable. Renal cortical thinning bilaterally. 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 Subcentimeter low-attenuation lesion in the left kidney. No specific follow-up necessary. Ureters are decompressed.  Bladder is low in volume. Stomach/Bowel: Stomach and small bowel are unremarkable. Appendix is not readily visualized. Moderate stool burden. No fecal impaction. Vascular/Lymphatic: Atherosclerotic calcification of the aorta. No pathologically enlarged lymph nodes. Reproductive: Uterus is visualized.  No adnexal mass. Other: No free fluid.  Mesenteries and peritoneum are unremarkable. Musculoskeletal: Degenerative changes in the spine. L3-L5 posterior lumbar interbody fusion.   1. Moderate stool burden.  No fecal impaction. 2. Aortic atherosclerosis (ICD10-I70.0). Coronary artery calcification. Electronically Signed   By: Newell Eke M.D.   On: 11/23/2023  12:44   US  Carotid Doppler Bilateral Result Date: 11/23/2023                                                   [facility]                                                 [facility]                                                 [facility]                                                 [place]                                                  Heart and                                                  Vascular                                                  [address]                                                 [place]  [place] [address]                                       Carotid Ultrasound Report Name  INNOCENCE, SCHLOTZHAUER             Study Date  11-23-2023 10 41 AM MRN  [id number]                    Patient Location  [facility] DOB  08-26-1955                  Gender  Female                              Height  61 in Age  39 yrs                      Ethnicity  1                                Weight  166 lb Ordering Physician  NICHOLES MILLION Referring Physician  NICHOLES MILLION Performed By  GLADIS DILLON Interpretation Summary Color flow duplex  imaging exam of the bilateral cervical carotid systems reveals minimal plaque of the bilateral internal carotid arteries consistent with 1-39% diameter reduction. Antegrade flow bilateral vertebral arteries. Bilateral subclavian arterial signals are normal consistent with no significant arterial occlusion. Procedure Complete bilateral carotid duplex study performed. Measurements and Calculations                                          Right    No Laterality     Left    Unit         Prox CCA PSV                      75.4                      82.6    cm-sec         Prox CCA EDV                      12.1                      15.5    cm-sec         Mid CCA PSV                       65.1                     -66.3    cm-sec         Mid CCA EDV                       12.9                     -14.9    cm-sec         Dist CCA PSV                     -  53.7                     -70.2    cm-sec         Dist CCA EDV                     -11.0                     -15.4    cm-sec         CCA                              -53.7                     -70.2    cm-sec         Prox ECA PSV                     -90.4                     -81.1    cm-sec         Prox ECA EDV                      -6.9                              cm-sec         Bulb PSV                         -47.0                     -61.9    cm-sec         Bulb EDV                          -9.4                     -15.4    cm-sec         Prox ICA PSV                     -51.3                     -78.1    cm-sec         Prox ICA EDV                     -14.1                     -19.7    cm-sec         Mid ICA PSV                      -78.1                     -72.2    cm-sec         Mid ICA EDV                      -22.4                     -  25.6    cm-sec         Dist ICA PSV                     -92.7                     -100.2   cm-sec         Dist ICA EDV                     -23.6                     -24.6    cm-sec         ICA ratio vel                    -92.7                      -100.0   cm-sec         Vertebral A PSV                   65.4                      56.8    cm-sec         Vertebral A EDV                   13.6                      10.6    cm-sec         ICA-CCA ratio                     1.7                       1.4         Prox SCLA PSV                    107.4                     142.2    cm-sec Right Findings No significant atherosclerosis or stenosis of the right common carotid artery. The Doppler flow velocities within the right external carotid artery are within normal limits. 1-39% stenosis of the right internal carotid artery. Plaque is calcified . Antegrade flow is noted in the right vertebral artery. Left Findings No significant atherosclerosis or stenosis of the left common carotid artery. The Doppler flow velocities within the left external carotid artery are within normal limits. 1-39% stenosis of the left internal carotid artery. Plaque is calcified . Antegrade flow is noted in the left vertebral artery. ______________________________________________________________________________ Electronically signed by MD Alverna JONELLE Sieving, MD, [id number]   on   11-23-2023 12 22 PM  MRI Brain WO Contrast Result Date: 11/23/2023 EXAM: MRI BRAIN WITHOUT CONTRAST 11/23/2023 08:30:05 AM TECHNIQUE: Multiplanar multisequence MRI of the head/brain was performed without the administration of intravenous contrast. COMPARISON: CT of the head dated 11/22/2023 and MRI of the head dated 06/29/2023. CLINICAL HISTORY: 68 y.o. female with PMHx of HLD, HTN, PAF on Pradaxa , PE, CKD3, TIA, CVA, RA, IBS, GERD, GAD, and MDD, who presents to the ED with complaints of syncopal episode at home with fall just prior to arrival. FINDINGS: BRAIN AND  VENTRICLES: Acute small vessel infarcts present within the left centrum semiovale at the junction of the frontal and parietal lobes. Numerous foci of increased T2 signal present throughout the periventricular and deep cerebral white matter. No evidence of  hemorrhage, mass or hydrocephalus. The patient is status post bilateral lens replacement. ORBITS: No acute abnormality. SINUSES AND MASTOIDS: No acute abnormality. BONES AND SOFT TISSUES: Normal bone marrow signal. No acute soft tissue abnormality.   1. Acute small vessel infarcts in the left centrum semiovale at the junction of the frontal and parietal lobes. 2. No evidence of hemorrhage, mass, or hydrocephalus. Electronically signed by: evalene coho 11/23/2023 09:08 AM EDT RP Workstation: [id number]   ECG 12 lead Result Date: 11/22/2023 Ventricular Rate                   61        BPM                 Atrial Rate                        61        BPM                 P-R Interval                       174       ms                  QRS Duration                       80        ms                  Q-T Interval                       410       ms                  QTC Calculation Bazett             412       ms                  Calculated P Axis                  57        degrees             Calculated R Axis                  46        degrees             Calculated T Axis                  54        degrees             Sinus rhythm Normal ECG When compared with ECG of 21-Aug-2023 08 23, No significant change was found Confirmed by Leda Purchase  [id number]  on 11-22-2023 10 03 51 AM  XR Chest 1 View Result Date: 11/22/2023 CLINICAL DATA:  Syncope EXAM: CHEST  1 VIEW COMPARISON:  11/03/2023 FINDINGS: The lungs are clear without focal pneumonia, edema, pneumothorax or pleural effusion. The cardiopericardial silhouette is within normal limits for  size. Stable dysmorphism upper right ribs. Telemetry leads overlie the chest.   No active disease. Electronically Signed   By: Camellia Candle M.D.   On: 11/22/2023 08:05   CT Spine Cervical WO Contrast Result Date: 11/22/2023 EXAM: CT CERVICAL SPINE WITHOUT CONTRAST 11/22/2023 05:15:34 AM TECHNIQUE: CT of the cervical spine was performed without the administration of  intravenous contrast. Multiplanar reformatted images are provided for review. Automated exposure control, iterative reconstruction, and/or weight based adjustment of the mA/kV was utilized to reduce the radiation dose to as low as reasonably achievable. COMPARISON: None available. CLINICAL HISTORY: Syncope with fall pain left posterior head and neck pain. FINDINGS: CERVICAL SPINE: BONES AND ALIGNMENT: Mild straightening of the normal cervical lordosis is present. DEGENERATIVE CHANGES: Ankylosis is present across the disc space and right-sided facets at C4-5. Asymmetric right-sided facet hypertrophy contributes to moderate right foraminal stenosis at C3-4. Moderate foraminal stenosis is present bilaterally at C4-5, C5-6 and C6-7. SOFT TISSUES: No prevertebral soft tissue swelling. VASCULATURE: Atherosclerotic changes are present.   1. No acute abnormality of the cervical spine related to the reported syncope and fall. 2. Mild straightening of the normal cervical lordosis. 3. Ankylosis across the disc space and right-sided facets at C4-5. 4. Asymmetric right-sided facet hypertrophy at C3-4 contributing to moderate right foraminal stenosis. 5. Moderate foraminal stenosis bilaterally at C4-5, C5-6, and C6-7. Electronically signed by: Lonni Necessary MD 11/22/2023 05:35 AM EDT RP Workstation: [id number]   CT Head WO Contrast Result Date: 11/22/2023 EXAM: CT HEAD WITHOUT CONTRAST 11/22/2023 05:15:34 AM TECHNIQUE: CT of the head was performed without the administration of intravenous contrast. Automated exposure control, iterative reconstruction, and/or weight based adjustment of the mA/kV was utilized to reduce the radiation dose to as low as reasonably achievable. COMPARISON: MR head without contrast 06/29/2023. CT head without contrast 10/25/2023. CLINICAL HISTORY: Syncope with fall pain left posterior head and neck pain. FINDINGS: BRAIN AND VENTRICLES: No acute hemorrhage. Gray-white differentiation is  preserved. No hydrocephalus. No extra-axial collection. No mass effect or midline shift. Periventricular white matter disease is moderately advanced for age, stable. Multiple remote lacunar infarcts and dilated perivascular spaces are again noted within the basal ganglia bilaterally. ORBITS: Bilateral lens replacements are noted. The globes and orbits are otherwise within normal limits. SINUSES: No acute abnormality. SOFT TISSUES AND SKULL: No acute soft tissue abnormality. No skull fracture.   1. No acute intracranial abnormality related to the syncope and fall. 2. Moderately advanced periventricular white matter disease, stable. 3. Multiple remote lacunar infarcts and dilated perivascular spaces within the basal ganglia bilaterally. Electronically signed by: Lonni Necessary MD 11/22/2023 05:31 AM EDT RP Workstation: [id number]   XR Chest 2 Views Result Date: 11/03/2023 CLINICAL DATA:  Expect Ori wheezing EXAM: CHEST - 2 VIEW COMPARISON:  08/21/2023 FINDINGS: No acute airspace disease or pleural effusion. Stable cardiomediastinal silhouette with aortic atherosclerosis. Chronic right upper rib deformities. No pneumothorax IMPRESSION: No active cardiopulmonary disease. Electronically Signed   By: Luke Bun M.D.   On: 11/03/2023 22:54  Transthoracic echo (TTE) complete Result Date: 10/26/2023 This result has an attachment that is not available.    ECHOCARDIOGRAM REPORT   Patient Name:   JEWELS LANGONE Date of Exam: 10/26/2023 Medical Rec #:  [id number]   Height:       61.0 in Accession #:    [id number]  Weight:       165.0 lb Date of Birth:  12-03-1955   BSA:          1.740  m Patient Age:    16 years    BP:           176/72 mmHg Patient Gender: F           HR:           62 bpm. Exam Location:  Inpatient Procedure: 2D Echo, 3D Echo, Color Doppler, Cardiac Doppler and Strain Analysis            (Both Spectral and Color Flow Doppler were utilized during            procedure). Indications:     Stroke I63.9  History:         Patient has no prior history of Echocardiogram examinations.                  TIA; Risk Factors:Hypertension and Pre-diabetes. Sonographer:     Koleen Popper RDCS Referring Phys:  [phone] ERIC LINDZEN Diagnosing Phys: Salena Negri MD Sonographer Comments: Global longitudinal strain was attempted. IMPRESSIONS  1. Left ventricular ejection fraction, by estimation, is 65 to 70%. The left ventricle has normal function. The left ventricle has no regional wall motion abnormalities. Left ventricular diastolic parameters are consistent with Grade I diastolic dysfunction (impaired relaxation). The global longitudinal strain is normal.  2. Right ventricular systolic function is normal. The right ventricular size is normal. There is normal pulmonary artery systolic pressure.  3. Left atrial size was mildly dilated.  4. Right atrial size was mildly dilated.  5. The mitral valve is normal in structure. Mild mitral valve regurgitation.  6. The aortic valve is tricuspid. Aortic valve regurgitation is trivial. Aortic valve sclerosis is present, with no evidence of aortic valve stenosis.  7. The inferior vena cava is normal in size with greater than 50% respiratory variability, suggesting right atrial pressure of 3 mmHg. FINDINGS  Left Ventricle: Left ventricular ejection fraction, by estimation, is 65 to 70%. The left ventricle has normal function. The left ventricle has no regional wall motion abnormalities. Strain was performed and the global longitudinal strain is normal. The  left ventricular internal cavity size was normal in size. There is borderline concentric left ventricular hypertrophy. Left ventricular diastolic parameters are consistent with Grade I diastolic dysfunction (impaired relaxation). Right Ventricle: The right ventricular size is normal. No increase in right ventricular wall thickness. Right ventricular systolic function is normal. There is normal pulmonary artery systolic pressure. The  tricuspid regurgitant velocity is 2.78 m/s, and  with an assumed right atrial pressure of 3 mmHg, the estimated right ventricular systolic pressure is 33.9 mmHg. Left Atrium: Left atrial size was mildly dilated. Right Atrium: Right atrial size was mildly dilated. Pericardium: There is no evidence of pericardial effusion. Mitral Valve: The mitral valve is normal in structure. Mild mitral valve regurgitation. Tricuspid Valve: The tricuspid valve is normal in structure. Tricuspid valve regurgitation is mild. Aortic Valve: The aortic valve is tricuspid. Aortic valve regurgitation is trivial. Aortic valve sclerosis is present, with no evidence of aortic valve stenosis. Pulmonic Valve: The pulmonic valve was normal in structure. Pulmonic valve regurgitation is trivial. Aorta: The aortic root is normal in size and structure. Venous: The inferior vena cava is normal in size with greater than 50% respiratory variability, suggesting right atrial pressure of 3 mmHg. IAS/Shunts: No atrial level shunt detected by color flow Doppler. Additional Comments: 3D was performed not requiring image post processing on an independent workstation and was normal. LEFT VENTRICLE PLAX 2D LVIDd:  4.20 cm   Diastology LVIDs:         2.50 cm   LV e' medial:    6.74 cm/s LV PW:         1.00 cm   LV E/e' medial:  9.5 LV IVS:        1.10 cm   LV e' lateral:   8.70 cm/s LVOT diam:     1.90 cm   LV E/e' lateral: 7.3 LV SV:         59 LV SV Index:   34 LVOT Area:     2.84 cm                          3D Volume EF:                          3D EF:        65 %                          LV EDV:       109 ml                          LV ESV:       38 ml                          LV SV:        71 ml RIGHT VENTRICLE             IVC RV S prime:     15.10 cm/s  IVC diam: 1.20 cm TAPSE (M-mode): 1.6 cm LEFT ATRIUM             Index LA diam:        4.40 cm 2.53 cm/m LA Vol (A2C):   35.4 ml 20.34 ml/m LA Vol (A4C):   41.1 ml 23.61 ml/m LA Biplane Vol: 38.7  ml 22.24 ml/m  AORTIC VALVE LVOT Vmax:   94.30 cm/s LVOT Vmean:  60.300 cm/s LVOT VTI:    0.209 m AORTA Ao Root diam: 3.20 cm Ao Asc diam:  3.20 cm MITRAL VALVE               TRICUSPID VALVE MV Area (PHT): 3.53 cm    TR Peak grad:   30.9 mmHg MV Decel Time: 215 msec    TR Vmax:        278.00 cm/s MV E velocity: 63.90 cm/s MV A velocity: 67.10 cm/s  SHUNTS MV E/A ratio:  0.95        Systemic VTI:  0.21 m                            Systemic Diam: 1.90 cm Salena Negri MD Electronically signed by Salena Negri MD Signature Date/Time: 10/26/2023/6:01:57 PM   Final    MRI Brain WO Contrast Result Date: 10/26/2023 CLINICAL DATA:  Neuro deficit, acute, stroke suspected EXAM: MRI HEAD WITHOUT CONTRAST TECHNIQUE: Multiplanar, multiecho pulse sequences of the brain and surrounding structures were obtained without intravenous contrast. COMPARISON:  CT angiogram of the head dated October 25, 2023 and MRI of the head dated February 22, 2022. FINDINGS: Brain: There is restricted in diffusion involving the left precentral gyrus there is also a small acute subcortical  lacunar infarct at the left temporal occipital junction, which is seen on image 69 of series 5. An additional more subtle focus of restricted diffusion is present within the left parietal white matter on image 79. There is age related volume loss and extensive diffuse cerebral white matter disease. There is no evidence of hemorrhage, mass or hydrocephalus. Vascular: Normal vascular flow voids. Skull and upper cervical spine: Normal marrow signal. No osseous lesions. Sinuses/Orbits: Clear paranasal sinuses. Status post bilateral lens replacement. Other: None. IMPRESSION: 1. Acute nonhemorrhagic infarct involving the left precentral gyrus. 2. Small lacunar white matter infarcts involving the left temporal occipital junction and the left parietal lobe. 3. Advanced cerebral white matter disease. Electronically Signed   By: Evalene Coho M.D.   On: 10/26/2023  12:20  CT ANGIO HEAD NECK W WO CM (CODE STROKE) Result Date: 10/25/2023 CLINICAL DATA:  Neuro deficit, acute, stroke suspected EXAM: CT ANGIOGRAPHY HEAD AND NECK WITH AND WITHOUT CONTRAST TECHNIQUE: Multidetector CT imaging of the head and neck was performed using the standard protocol during bolus administration of intravenous contrast. Multiplanar CT image reconstructions and MIPs were obtained to evaluate the vascular anatomy. Carotid stenosis measurements (when applicable) are obtained utilizing NASCET criteria, using the distal internal carotid diameter as the denominator. RADIATION DOSE REDUCTION: This exam was performed according to the departmental dose-optimization program which includes automated exposure control, adjustment of the mA and/or kV according to patient size and/or use of iterative reconstruction technique. CONTRAST:  75mL OMNIPAQUE  IOHEXOL  350 MG/ML SOLN COMPARISON:  None Available. FINDINGS: CTA NECK FINDINGS Aortic arch: Great vessel origins are patent. Aortic atherosclerosis. Right carotid system: No evidence of dissection, stenosis (50% or greater), or occlusion. Left carotid system: Approximately 50% stenosis of the left proximal ICA due to atherosclerosis. Vertebral arteries: Severe left vertebral artery origin stenosis. Patent bilaterally. Right dominant. Skeleton: No evidence of acute abnormality on limited assessment. Other neck: No evidence of acute abnormality on limited assessment. Upper chest: Visualized lung apices are clear. Review of the MIP images confirms the above findings CTA HEAD FINDINGS Anterior circulation: Bilateral intracranial ICAs, MCAs, and are patent without proximal hemodynamically significant stenosis. Posterior circulation: Bilateral intradural vertebral arteries, basilar artery and bilateral posterior arteries are patent without proximal 8 image least significant stenosis. Venous sinuses: As permitted by contrast timing, patent. Review of the MIP images  confirms the above findings IMPRESSION: 1. No emergent large vessel occlusion. 2. Severe left vertebral artery origin stenosis. 3. 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 4. Aortic Atherosclerosis (ICD10-I70.0). Electronically Signed   By: Gilmore GORMAN Molt M.D.   On: 10/25/2023 22:53  CT Code Stroke Head WO Only Result Date: 10/25/2023 CLINICAL DATA:  Code stroke.  Neuro deficit, acute, stroke suspected EXAM: CT HEAD WITHOUT CONTRAST TECHNIQUE: Contiguous axial images were obtained from the base of the skull through the vertex without intravenous contrast. RADIATION DOSE REDUCTION: This exam was performed according to the departmental dose-optimization program which includes automated exposure control, adjustment of the mA and/or kV according to patient size and/or use of iterative reconstruction technique. COMPARISON:  CT headMay 9, 2025. FINDINGS: Brain: Similar patchy hypodensities in the white matter and deep gray nuclei, compatible with chronic microvascular ischemic change. Small remote infarct in the left caudate superiorly. No evidence of acute large vascular territory infarct, acute hemorrhage, mass lesion or midline shift. Vascular: No hyperdense vessel. Skull: No acute fracture. Sinuses/Orbits: Clear sinuses.  No acute orbital findings. Other: No mastoid effusions. ASPECTS [facility] Stroke Program Early CT Score)  Total score (0-10 with 10 being normal): 10. IMPRESSION: 1. No evidence of acute intracranial abnormality. ASPECTS is 10. 2. Chronic microvascular ischemic change. Code stroke imaging results were communicated on 10/25/2023 at 10:34 pm to provider Dr. Merrianne via secure text paging. Electronically Signed   By: Gilmore GORMAN Molt M.D.   On: 10/25/2023 22:35   _____________________________________________________________________________ Discharge Instructions:   Discharge Orders     Discharge Diet (specify)     Details:    Diet type: Mediterranean   Full Code     Lifting  Limits:     Details:    Lifting Limits: No lifting limits        Future Appointments  Date Time Provider Department Center  12/01/2023 10:30 AM Morna Skates Red[place], NP [facility] [address] [facility]  12/07/2023  2:00 PM Sharri Fortune, MD [facility] [place]  12/31/2023  2:30 PM Olufunso Mercy Ogunjobi, NP [facility] [address]  01/25/2024  1:20 PM Sharri Fortune, MD [facility] [place]         *Some images could not be shown.

## 2023-12-02 NOTE — Progress Notes (Signed)
 Physical Therapy Missed Visit Note  Payor: [employer] MEDICARE ADV / Plan: HU[employer]NA MA / Product Type: Medicare Advantage /     The patient did not attend therapy appointment.  Reason:  No Show  Total Missed Visits: Missed Visit Count: 1  Patient Contact:  No Contact with patient  Future Appointments:  Patient does not have additional appointments.  Action:  Initial evaluation, patient responsible for calling to reschedule.

## 2023-12-03 NOTE — Therapy (Incomplete)
 OUTPATIENT OCCUPATIONAL THERAPY NEURO EVALUATION  Patient Name: Holly Henry MRN: [id number] DOB:07/26/1955, 68 y.o., female Today's Date: 12/03/2023  PCP: *** REFERRING PROVIDER: Monty Daphne CROME, NP  END OF SESSION:   Past Medical History:  Diagnosis Date   A-fib (HCC)    Fibromyalgia    High cholesterol    Hypertension    Osteoarthritis    Prediabetes    Rheumatoid arthritis (HCC)    Past Surgical History:  Procedure Laterality Date   BACK SURGERY     CATARACT EXTRACTION Bilateral    TONSILLECTOMY     Patient Active Problem List   Diagnosis Date Noted   Major depressive disorder, recurrent episode, moderate (HCC) 11/02/2023   GAD (generalized anxiety disorder) 11/02/2023   Ischemic cerebrovascular accident (CVA) (HCC) 10/28/2023   Stroke-like symptoms 10/26/2023   Acute stroke due to ischemia (HCC) 10/26/2023    ONSET DATE: ***  REFERRING DIAG: I63.9 (ICD-10-CM) - Acute stroke due to ischemia (HCC)  THERAPY DIAG:  No diagnosis found.  Rationale for Evaluation and Treatment: Rehabilitation  SUBJECTIVE:   SUBJECTIVE STATEMENT: *** Pt accompanied by: {accompnied:27141}  PERTINENT HISTORY: 68 years old female with PMH of stroke, A-fib on Pradaxa , HTN, obesity, PE, rheumatoid arthritis, IBS, CKD stage III, GAD, depression, admitted from [facility] on 11/23/2023 with recurrent stroke.   She was earlier admitted to [facility]U on 8/10 for syncope and fall.MRI of the brain revealed acute small vessel infarcts of the left centrum semiovale at the junction of the frontal and parietal lobes.    10/26/23 MRI with Acute nonhemorrhagic infarct involving the left precentral gyrus. Small lacunar white matter infarcts involving the left temporal  occipital junction and the left parietal lobe.  Advanced cerebral white matter disease.   PMH:  History of stroke A-fib on Pradaxa  Essential hypertension History of PE Rheumatoid arthritis IBS CKD stage III Anxiety and  depression Fibromyalgia High Cholesterol   PRECAUTIONS: {Therapy precautions:24002}  WEIGHT BEARING RESTRICTIONS: {Yes ***/No:24003}  PAIN:  Are you having pain? {OPRCPAIN:27236}  FALLS: Has patient fallen in last 6 months? {fallsyesno:27318}  LIVING ENVIRONMENT: Lives with: {OPRC lives with:25569::lives with their family} Lives in: {Lives in:25570} Stairs: {opstairs:27293} Has following equipment at home: {Assistive devices:23999}  PLOF: {PLOF:24004}  PATIENT GOALS: ***  OBJECTIVE:  Note: Objective measures were completed at Evaluation unless otherwise noted.  HAND DOMINANCE: {MISC; OT HAND DOMINANCE:[phone]}  ADLs: Overall ADLs: *** Transfers/ambulation related to ADLs: Eating: *** Grooming: *** UB Dressing: *** LB Dressing: *** Toileting: *** Bathing: *** Tub Shower transfers: *** Equipment: {equipment:25573}  IADLs: Shopping: *** Light housekeeping: *** Meal Prep: *** Community mobility: *** Medication management: *** Financial management: *** Handwriting: {OTWRITTENEXPRESSION:25361}  MOBILITY STATUS: {OTMOBILITY:25360}  POSTURE COMMENTS:  {posture:25561} Sitting balance: {sitting balance:25483}  ACTIVITY TOLERANCE: Activity tolerance: ***  FUNCTIONAL OUTCOME MEASURES: {OTFUNCTIONALMEASURES:27238}  UPPER EXTREMITY ROM:    {AROM/PROM:27142} ROM Right eval Left eval  Shoulder flexion    Shoulder abduction    Shoulder adduction    Shoulder extension    Shoulder internal rotation    Shoulder external rotation    Elbow flexion    Elbow extension    Wrist flexion    Wrist extension    Wrist ulnar deviation    Wrist radial deviation    Wrist pronation    Wrist supination    (Blank rows = not tested)  UPPER EXTREMITY MMT:     MMT Right eval Left eval  Shoulder flexion    Shoulder abduction    Shoulder adduction  Shoulder extension    Shoulder internal rotation    Shoulder external rotation    Middle trapezius    Lower  trapezius    Elbow flexion    Elbow extension    Wrist flexion    Wrist extension    Wrist ulnar deviation    Wrist radial deviation    Wrist pronation    Wrist supination    (Blank rows = not tested)  HAND FUNCTION: {handfunction:27230}  COORDINATION: {otcoordination:27237}  SENSATION: {sensation:27233}  EDEMA: ***  MUSCLE TONE: {UETONE:25567}  COGNITION: Overall cognitive status: {cognition:24006}  VISION: Subjective report: *** Baseline vision: {OTBASELINEVISION:25363} Visual history: {OTVISUALHISTORY:25364}  VISION ASSESSMENT: {visionassessment:27231}  Patient has difficulty with following activities due to following visual impairments: ***  PERCEPTION: {Perception:25564}  PRAXIS: {Praxis:25565}  OBSERVATIONS: ***                                                                                                                             TREATMENT DATE: ***         PATIENT EDUCATION: Education details: *** Person educated: {Person educated:25204} Education method: {Education Method:25205} Education comprehension: {Education Comprehension:25206}  HOME EXERCISE PROGRAM: ***   GOALS: Goals reviewed with patient? {yes/no:20286}  SHORT TERM GOALS: Target date: ***  *** Baseline: Goal status: INITIAL  2.  *** Baseline:  Goal status: INITIAL  3.  *** Baseline:  Goal status: INITIAL  4.  *** Baseline:  Goal status: INITIAL  5.  *** Baseline:  Goal status: INITIAL  6.  *** Baseline:  Goal status: INITIAL  LONG TERM GOALS: Target date: ***  *** Baseline:  Goal status: INITIAL  2.  *** Baseline:  Goal status: INITIAL  3.  *** Baseline:  Goal status: INITIAL  4.  *** Baseline:  Goal status: INITIAL  5.  *** Baseline:  Goal status: INITIAL  6.  *** Baseline:  Goal status: INITIAL  ASSESSMENT:  CLINICAL IMPRESSION: Patient is a *** y.o. *** who was seen today for occupational therapy evaluation for ***.    PERFORMANCE DEFICITS: in functional skills including {OT physical skills:25468}, cognitive skills including {OT cognitive skills:25469}, and psychosocial skills including {OT psychosocial skills:25470}.   IMPAIRMENTS: are limiting patient from {OT performance deficits:25471}.   CO-MORBIDITIES: {Comorbidities:25485} that affects occupational performance. Patient will benefit from skilled OT to address above impairments and improve overall function.  MODIFICATION OR ASSISTANCE TO COMPLETE EVALUATION: {OT modification:25474}  OT OCCUPATIONAL PROFILE AND HISTORY: {OT PROFILE AND HISTORY:25484}  CLINICAL DECISION MAKING: {OT CDM:25475}  REHAB POTENTIAL: {rehabpotential:25112}  EVALUATION COMPLEXITY: {Evaluation complexity:25115}    PLAN:  OT FREQUENCY: {rehab frequency:25116}  OT DURATION: {rehab duration:25117}  PLANNED INTERVENTIONS: {OT Interventions:25467}  RECOMMENDED OTHER SERVICES: ***  CONSULTED AND AGREED WITH PLAN OF CARE: {ENR:74513}  PLAN FOR NEXT SESSION: PIERRETTE ONEITA SLOUGH, OTR/L 12/03/2023, 7:27 PM

## 2023-12-03 NOTE — Therapy (Incomplete)
 OUTPATIENT PHYSICAL THERAPY NEURO EVALUATION   Patient Name: Holly Henry MRN: [id number] DOB:Jul 13, 1955, 68 y.o., female Today's Date: 12/03/2023   PCP: Caldwell Fickle REFERRING PROVIDER: Daphne Finders  END OF SESSION:   Past Medical History:  Diagnosis Date   A-fib (HCC)    Fibromyalgia    High cholesterol    Hypertension    Osteoarthritis    Prediabetes    Rheumatoid arthritis (HCC)    Past Surgical History:  Procedure Laterality Date   BACK SURGERY     CATARACT EXTRACTION Bilateral    TONSILLECTOMY     Patient Active Problem List   Diagnosis Date Noted   Major depressive disorder, recurrent episode, moderate (HCC) 11/02/2023   GAD (generalized anxiety disorder) 11/02/2023   Ischemic cerebrovascular accident (CVA) (HCC) 10/28/2023   Stroke-like symptoms 10/26/2023   Acute stroke due to ischemia (HCC) 10/26/2023    ONSET DATE: 10/25/23  REFERRING DIAG:  I63.9 (ICD-10-CM) - Acute stroke due to ischemia (HCC)      THERAPY DIAG:  No diagnosis found.  Rationale for Evaluation and Treatment: Rehabilitation  SUBJECTIVE:                                                                                                                                                                                             SUBJECTIVE STATEMENT: *** Pt accompanied by: {accompnied:27141}  PERTINENT HISTORY: Holly Henry is a 68 y.o. female with medical history significant for paroxysmal A-fib on Eliquis , hypertension, hyperlipidemia, rheumatoid arthritis, GERD, IBS, chronic anxiety/depression, prior CVA, fibromyaglia, who presented to the ER from home via EMS with complaints of sudden onset of right arm and leg weakness.  Patient is admitted to the hospitalist service for further management evaluation of acute stroke.  MRI brain showed acute nonhemorrhagic infarct involving left precentral gyrus. Small lacunar white matter infarct involving the left temporal occipital junction and the  left parietal lobe.     PAIN:  Are you having pain? {OPRCPAIN:27236}  PRECAUTIONS: {Therapy precautions:24002}  RED FLAGS: {PT Red Flags:29287}   WEIGHT BEARING RESTRICTIONS: {Yes ***/No:24003}  FALLS: Has patient fallen in last 6 months? {fallsyesno:27318}  LIVING ENVIRONMENT: Lives with: {OPRC lives with:25569::lives with their family} Lives in: {Lives in:25570} Stairs: {opstairs:27293} Has following equipment at home: {Assistive devices:23999}  PLOF: {PLOF:24004}  PATIENT GOALS: ***  OBJECTIVE:  Note: Objective measures were completed at Evaluation unless otherwise noted.  DIAGNOSTIC FINDINGS: Brain: There is restricted in diffusion involving the left precentral gyrus there is also a small acute subcortical lacunar infarct at the left temporal occipital junction, which is seen on image 69 of series 5. An additional more subtle  focus of restricted diffusion is present within the left parietal white matter on image 79. There is age related volume loss and extensive diffuse cerebral white matter disease. There is no evidence of hemorrhage, mass or hydrocephalus.  IMPRESSION: 1. Acute nonhemorrhagic infarct involving the left precentral gyrus. 2. Small lacunar white matter infarcts involving the left temporal occipital junction and the left parietal lobe. 3. Advanced cerebral white matter disease.  COGNITION: Overall cognitive status: {cognition:24006}   SENSATION: {sensation:27233}  COORDINATION: ***  EDEMA:  {edema:24020}  MUSCLE TONE: {LE tone:25568}  MUSCLE LENGTH: Hamstrings: Right *** deg; Left *** deg Debby test: Right *** deg; Left *** deg  DTRs:  {DTR SITE:24025}  POSTURE: {posture:25561}  LOWER EXTREMITY ROM:     {AROM/PROM:27142}  Right Eval Left Eval  Hip flexion    Hip extension    Hip abduction    Hip adduction    Hip internal rotation    Hip external rotation    Knee flexion    Knee extension    Ankle dorsiflexion    Ankle  plantarflexion    Ankle inversion    Ankle eversion     (Blank rows = not tested)  LOWER EXTREMITY MMT:    MMT Right Eval Left Eval  Hip flexion    Hip extension    Hip abduction    Hip adduction    Hip internal rotation    Hip external rotation    Knee flexion    Knee extension    Ankle dorsiflexion    Ankle plantarflexion    Ankle inversion    Ankle eversion    (Blank rows = not tested)  BED MOBILITY:  {bed mobility:32615:p}  TRANSFERS: {transfers eval:32620}  RAMP:  {ramp eval:32616}  CURB:  {curb eval:32617}  STAIRS: {stairs eval:32618} GAIT: Findings: {GaitneuroPT:32644::Distance walked: ***,Comments: ***}  FUNCTIONAL TESTS:  {Functional tests:24029}  PATIENT SURVEYS:  {rehab surveys:24030}                                                                                                                              TREATMENT DATE: ***    PATIENT EDUCATION: Education details: *** Person educated: {Person educated:25204} Education method: {Education Method:25205} Education comprehension: {Education Comprehension:25206}  HOME EXERCISE PROGRAM: ***  GOALS: Goals reviewed with patient? {yes/no:20286}  SHORT TERM GOALS: Target date: ***  *** Baseline: Goal status: INITIAL  2.  *** Baseline:  Goal status: INITIAL  3.  *** Baseline:  Goal status: INITIAL  4.  *** Baseline:  Goal status: INITIAL  5.  *** Baseline:  Goal status: INITIAL  6.  *** Baseline:  Goal status: INITIAL  LONG TERM GOALS: Target date: ***  *** Baseline:  Goal status: INITIAL  2.  *** Baseline:  Goal status: INITIAL  3.  *** Baseline:  Goal status: INITIAL  4.  *** Baseline:  Goal status: INITIAL  5.  *** Baseline:  Goal status: INITIAL  6.  *** Baseline:  Goal  status: INITIAL  ASSESSMENT:  CLINICAL IMPRESSION: Patient is a *** y.o. *** who was seen today for physical therapy evaluation and treatment for ***.   OBJECTIVE  IMPAIRMENTS: {opptimpairments:25111}.   ACTIVITY LIMITATIONS: {activitylimitations:27494}  PARTICIPATION LIMITATIONS: {participationrestrictions:25113}  PERSONAL FACTORS: {Personal factors:25162} are also affecting patient's functional outcome.   REHAB POTENTIAL: {rehabpotential:25112}  CLINICAL DECISION MAKING: {clinical decision making:25114}  EVALUATION COMPLEXITY: {Evaluation complexity:25115}  PLAN:  PT FREQUENCY: {rehab frequency:25116}  PT DURATION: {rehab duration:25117}  PLANNED INTERVENTIONS: {rehab planned interventions:25118::97110-Therapeutic exercises,97530- Therapeutic [phone]- Neuromuscular re-education,97535- Self Rjmz,02859- Manual therapy}  PLAN FOR NEXT SESSION: ***   Almetta Fam, PT 12/03/2023, 3:52 PM

## 2023-12-04 ENCOUNTER — Ambulatory Visit: Admitting: Occupational Therapy

## 2023-12-04 ENCOUNTER — Ambulatory Visit: Attending: Student

## 2023-12-08 ENCOUNTER — Encounter: Admitting: Registered Nurse

## 2023-12-17 ENCOUNTER — Encounter: Attending: Registered Nurse | Admitting: Registered Nurse

## 2023-12-17 NOTE — Progress Notes (Deleted)
   Subjective:    Patient ID: Holly Henry, female    DOB: 04/10/1956, 68 y.o.   MRN: [id number]  HPI  Pain Inventory Average Pain {NUMBERS; 0-10:5044} Pain Right Now {NUMBERS; 0-10:5044} My pain is {PAIN DESCRIPTION:21022940}  LOCATION OF PAIN  ***  BOWEL Number of stools per week: *** Oral laxative use {YES/NO:21197} Type of laxative *** Enema or suppository use {YES/NO:21197} History of colostomy {YES/NO:21197} Incontinent {YES/NO:21197}  BLADDER {bladder options:24190} In and out cath, frequency *** Able to self cath {YES/NO:21197} Bladder incontinence {YES/NO:21197} Frequent urination {YES/NO:21197} Leakage with coughing {YES/NO:21197} Difficulty starting stream {YES/NO:21197} Incomplete bladder emptying {YES/NO:21197}   Mobility {MOBILITY JIO:78977055}  Function {FUNCTION:21022946}  Neuro/Psych {NEURO/PSYCH:21022948}  Prior Studies {CPRM PRIOR STUDIES:21022953}  Physicians involved in your care {CPRM PHYSICIANS INVOLVED IN YOUR CARE:21022954}   No family history on file. Social History   Socioeconomic History   Marital status: Divorced    Spouse name: Not on file   Number of children: Not on file   Years of education: Not on file   Highest education level: Not on file  Occupational History   Not on file  Tobacco Use   Smoking status: Never   Smokeless tobacco: Never  Substance and Sexual Activity   Alcohol use: Never   Drug use: Never   Sexual activity: Not on file  Other Topics Concern   Not on file  Social History Narrative   Not on file   Social Drivers of Health   Financial Resource Strain: Not on file  Food Insecurity: Low Risk  (11/23/2023)   Received from [facility]   Hunger Vital Sign    Within the past 12 months, you worried that your food would run out before you got money to buy more: Never true    Within the past 12 months, the food you bought just didn't last and you didn't have money to get more. : Never true   Transportation Needs: No Transportation Needs (11/23/2023)   Received from [facility]    In the past 12 months, has lack of reliable transportation kept you from medical appointments, meetings, work or from getting things needed for daily living? : No  Physical Activity: Not on file  Stress: Not on file  Social Connections: Not on file   Past Surgical History:  Procedure Laterality Date   BACK SURGERY     CATARACT EXTRACTION Bilateral    TONSILLECTOMY     Past Medical History:  Diagnosis Date   A-fib (HCC)    Fibromyalgia    High cholesterol    Hypertension    Osteoarthritis    Prediabetes    Rheumatoid arthritis (HCC)    There were no vitals taken for this visit.  Opioid Risk Score:   Fall Risk Score:  `1  Depression screen PHQ 2/9      No data to display           Review of Systems     Objective:   Physical Exam        Assessment & Plan:
